# Patient Record
Sex: Female | Born: 1990
Health system: Southern US, Community
[De-identification: ages and names within clinical notes are randomized; demographics above are authoritative.]

## PROBLEM LIST (undated history)

## (undated) ENCOUNTER — Inpatient Hospital Stay (HOSPITAL_COMMUNITY): Payer: Self-pay

## (undated) ENCOUNTER — Emergency Department (HOSPITAL_COMMUNITY): Admission: EM | Payer: Medicaid Other | Source: Home / Self Care

## (undated) DIAGNOSIS — F32A Depression, unspecified: Secondary | ICD-10-CM

## (undated) DIAGNOSIS — Z87898 Personal history of other specified conditions: Secondary | ICD-10-CM

## (undated) DIAGNOSIS — F329 Major depressive disorder, single episode, unspecified: Secondary | ICD-10-CM

## (undated) HISTORY — PX: WISDOM TOOTH EXTRACTION: SHX21

## (undated) HISTORY — PX: OTHER SURGICAL HISTORY: SHX169

## (undated) HISTORY — DX: Depression, unspecified: F32.A

## (undated) HISTORY — PX: NO PAST SURGERIES: SHX2092

## (undated) HISTORY — DX: Major depressive disorder, single episode, unspecified: F32.9

---
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
## 2011-04-29 ENCOUNTER — Inpatient Hospital Stay (HOSPITAL_COMMUNITY)
Admission: RE | Admit: 2011-04-29 | Discharge: 2011-04-29 | Disposition: A | Discharge status: Home / Self Care | Payer: Self-pay | Source: Ambulatory Visit | Attending: Emergency Medicine | Admitting: Emergency Medicine

## 2011-05-03 LAB — URINALYSIS, ROUTINE W REFLEX MICROSCOPIC
Glucose, UA: NEGATIVE
Specific Gravity, Urine: 1.022
pH: 6.5

## 2011-05-03 LAB — URINE MICROSCOPIC-ADD ON

## 2011-05-03 LAB — PREGNANCY, URINE: Preg Test, Ur: NEGATIVE

## 2011-05-14 LAB — RAPID STREP SCREEN (MED CTR MEBANE ONLY): Streptococcus, Group A Screen (Direct): NEGATIVE

## 2011-09-05 ENCOUNTER — Emergency Department (HOSPITAL_COMMUNITY): Payer: Self-pay

## 2011-09-05 ENCOUNTER — Encounter (HOSPITAL_COMMUNITY): Payer: Self-pay | Admitting: Emergency Medicine

## 2011-09-05 ENCOUNTER — Emergency Department (HOSPITAL_COMMUNITY)
Admission: EM | Admit: 2011-09-05 | Discharge: 2011-09-05 | Disposition: A | Discharge status: Home / Self Care | Payer: Self-pay | Attending: Emergency Medicine | Admitting: Emergency Medicine

## 2011-09-05 DIAGNOSIS — R6884 Jaw pain: Secondary | ICD-10-CM | POA: Insufficient documentation
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
## 2011-09-09 NOTE — ED Provider Notes (Signed)
Medical screening examination/treatment/procedure(s) were performed by non-physician practitioner and as supervising physician I was immediately available for consultation/collaboration.   Rolan Bucco, MD 09/09/11 1505

## 2011-10-04 ENCOUNTER — Emergency Department (HOSPITAL_COMMUNITY)
Admission: EM | Admit: 2011-10-04 | Discharge: 2011-10-04 | Disposition: A | Discharge status: Home Health | Payer: Self-pay | Attending: Emergency Medicine | Admitting: Emergency Medicine

## 2011-10-04 ENCOUNTER — Encounter (HOSPITAL_COMMUNITY): Payer: Self-pay

## 2011-10-04 DIAGNOSIS — N9489 Other specified conditions associated with female genital organs and menstrual cycle: Secondary | ICD-10-CM | POA: Insufficient documentation

## 2011-10-04 DIAGNOSIS — N898 Other specified noninflammatory disorders of vagina: Secondary | ICD-10-CM | POA: Insufficient documentation

## 2011-10-04 DIAGNOSIS — N72 Inflammatory disease of cervix uteri: Secondary | ICD-10-CM | POA: Insufficient documentation

## 2011-10-04 LAB — URINALYSIS, ROUTINE W REFLEX MICROSCOPIC
Bilirubin Urine: NEGATIVE
Hgb urine dipstick: NEGATIVE
Ketones, ur: NEGATIVE mg/dL
Nitrite: NEGATIVE
Protein, ur: NEGATIVE mg/dL
Specific Gravity, Urine: 1.016 (ref 1.005–1.030)
Urobilinogen, UA: 0.2 mg/dL (ref 0.0–1.0)

## 2011-10-04 LAB — URINE MICROSCOPIC-ADD ON

## 2011-10-04 LAB — WET PREP, GENITAL: Trich, Wet Prep: NONE SEEN

## 2011-10-04 LAB — POCT PREGNANCY, URINE: Preg Test, Ur: NEGATIVE

## 2011-10-04 MED ORDER — LIDOCAINE HCL (PF) 1 % IJ SOLN
INTRAMUSCULAR | Status: AC
  Administered 2011-10-04: 5 mL via INTRAMUSCULAR
  Filled 2011-10-04: qty 5

## 2011-10-04 MED ORDER — CEFTRIAXONE SODIUM 250 MG IJ SOLR
250.0000 mg | Freq: Once | INTRAMUSCULAR | Status: AC
Start: 2011-10-04 — End: 2011-10-04
  Administered 2011-10-04: 250 mg via INTRAMUSCULAR
  Filled 2011-10-04: qty 250

## 2011-10-04 MED ORDER — AZITHROMYCIN 250 MG PO TABS
1000.0000 mg | ORAL_TABLET | Freq: Once | ORAL | Status: AC
  Administered 2011-10-04: 1000 mg via ORAL
  Filled 2011-10-04: qty 4

## 2011-10-04 NOTE — ED Provider Notes (Signed)
Medical screening examination/treatment/procedure(s) were performed by non-physician practitioner and as supervising physician I was immediately available for consultation/collaboration. Devoria Albe, MD, Armando Gang   Ward Givens, MD 10/04/11 2007

## 2011-10-04 NOTE — Discharge Instructions (Signed)
You were treated today for gonorrhea and chlamydia, the most common sexually transmitted infections that cause vaginal discharge. The test results for these will be available in 2-3 days. Please do not have sex for 1 week after you are treated for these diseases. If your test comes back positive, your sexual partner should also be treated.     Cervicitis Cervicitis is a soreness and swelling (inflammation) of the cervix. Your cervix is located at the bottom of your uterus which opens up to the vagina.  CAUSES   Sexually transmitted infections (STIs).   Allergic reaction.   Medicines or birth control devices that are put in the vagina.   Injury to the cervix.   Bacterial infections.  SYMPTOMS  There may be no symptoms. If symptoms occur, they may include:  Grey, white, yellow, or bad smelling vaginal discharge.   Pain or itching of the area outside the vagina.   Painful sexual intercourse.   Lower abdominal or lower back pain, especially during intercourse.   Frequent urination.   Abnormal vaginal bleeding between periods, after sexual intercourse, or after menopause.   Pressure or a heavy feeling in the pelvis.  DIAGNOSIS  Diagnosis is made after a pelvic exam. Other tests may include:  Examination of any discharge under a microscope (wet prep).   A Pap test.  TREATMENT  Treatment will depend on the cause of cervicitis. If it is caused by an STI, both you and your partner will need to be treated. Antibiotic medicines will be given. HOME CARE INSTRUCTIONS   Do not have sexual intercourse until your caregiver says it is okay.   Do not have sexual intercourse until your partner has been treated if your cervicitis is caused by an STI.   Take your antibiotics as directed. Finish them even if you start to feel better.  SEEK IMMEDIATE MEDICAL CARE IF:   Your symptoms come back.   You have a fever.   You experience any problems that may be related to the medicine you are  taking.  MAKE SURE YOU:   Understand these instructions.   Will watch your condition.   Will get help right away if you are not doing well or get worse.  Document Released: 07/29/2005 Document Revised: 04/10/2011 Document Reviewed: 02/25/2011 [facility] Patient Information 2012 [place], [place].

## 2011-10-04 NOTE — ED Notes (Signed)
Pt. Reports having pelvic pain with vaginal pain and discharge a, also reports having dysuria

## 2011-10-04 NOTE — ED Notes (Signed)
Discharge instructions reviewed with pt; verbalizes understanding.  No questions asked; no further c/o's voiced.  Pt ambulatory to lobby.

## 2011-10-04 NOTE — ED Provider Notes (Signed)
History     CSN: [id number]  Arrival date & time 10/04/11  1644   First MD Initiated Contact with Patient 10/04/11 1751      Chief Complaint  Patient presents with  . Pelvic Pain    (Consider location/radiation/quality/duration/timing/severity/associated sxs/prior treatment) The history is provided by the patient.   the patient is a 21 year old healthy G1 P57 female who presents the emergency department with 2 days of yellow/white vaginal discharge associated with pelvic pressure, dysuria, and urinary frequency. She denies any fever, chills, abdominal pain, nausea, vomiting, change in bowel habits, vaginal pain/itching/odor, vaginal bleeding, hematuria, back pain . She has had recent unprotected intercourse with one partner. She does not know if she could be pregnant. Last menstrual period was sometime in the middle of December. She has an implant for birth control that she reports has been associated with irregular menses in the past. Nothing makes the symptoms better or worse. she has tried nothing to treat the symptoms.             History reviewed. No pertinent past medical history.  History reviewed. No pertinent past surgical history.  No family history on file.  History  Substance Use Topics  . Smoking status: Current Everyday Smoker  . Smokeless tobacco: Not on file  . Alcohol Use: Yes     Review of Systems 10 systems reviewed and are negative for acute change except as noted in the HPI.  Allergies  Review of patient's allergies indicates no known allergies.  Home Medications   Current Outpatient Rx  Name Route Sig Dispense Refill  . IBUPROFEN 200 MG PO TABS Oral Take 400 mg by mouth every 6 (six) hours as needed. For headache pain      BP 94/49  Pulse 91  Temp(Src) 98.9 F (37.2 C) (Oral)  Resp 20  Ht 5\' 2"  (1.575 m)  Wt 93 lb (42.185 kg)  BMI 17.01 kg/m2  LMP 08/01/2011  Physical Exam  Nursing note and vitals reviewed. Constitutional: She is oriented  to person, place, and time. She appears well-developed and well-nourished. No distress.  HENT:  Head: Normocephalic and atraumatic.  Neck: Neck supple.  Cardiovascular: Normal rate, regular rhythm, normal heart sounds and intact distal pulses.   Pulmonary/Chest: Effort normal and breath sounds normal. No respiratory distress. She has no wheezes.  Abdominal: Soft. Bowel sounds are normal. She exhibits no distension. There is no tenderness. There is no rebound and no guarding.  Genitourinary: There is no rash, tenderness, lesion or injury on the right labia. There is no rash, tenderness, lesion or injury on the left labia. Cervix exhibits no motion tenderness. Right adnexum displays no mass and no tenderness. Left adnexum displays no mass and no tenderness. No tenderness or bleeding around the vagina. No foreign body around the vagina. Vaginal discharge found.  Musculoskeletal: She exhibits no edema and no tenderness.  Neurological: She is alert and oriented to person, place, and time.  Skin: Skin is warm and dry.  Psychiatric: She has a normal mood and affect.    ED Course  Procedures (including critical care time)  Labs Reviewed  URINALYSIS, ROUTINE W REFLEX MICROSCOPIC - Abnormal; Notable for the following:    APPearance TURBID (*)    Leukocytes, UA SMALL (*)    All other components within normal limits  WET PREP, GENITAL - Abnormal; Notable for the following:    WBC, Wet Prep HPF POC MODERATE (*)    All other components within normal limits  URINE MICROSCOPIC-ADD ON - Abnormal; Notable for the following:    Squamous Epithelial / LPF FEW (*)    Bacteria, UA FEW (*)    All other components within normal limits  POCT PREGNANCY, URINE  GC/CHLAMYDIA PROBE AMP, GENITAL   No results found.   1. Cervicitis       MDM  5:55 PM Patient has been seen and evaluated. Initial history and physical examination complete. Will perform a pelvic exam. Urinalysis and urine pregnancy test are  pending.  7:35 PM Urinalysis, wet prep reviewed with results consistent with cervicitis. No s/s PID. Will tx for STDs. Will send urine for culture, though I suspect contaminated sample as cause of few bacteria.      Shaaron Adler, [place] 10/04/11 1945

## 2011-10-05 LAB — GC/CHLAMYDIA PROBE AMP, GENITAL
Chlamydia, DNA Probe: POSITIVE — AB
GC Probe Amp, Genital: NEGATIVE

## 2011-10-06 NOTE — ED Notes (Signed)
Attempted to call patient.  No answer.

## 2011-10-06 NOTE — ED Notes (Signed)
+  Chlamydia. Patient treated with Rocephin and Zithromax. 
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
## 2012-01-10 ENCOUNTER — Encounter (HOSPITAL_COMMUNITY): Payer: Self-pay | Admitting: Emergency Medicine

## 2012-01-10 ENCOUNTER — Emergency Department (HOSPITAL_COMMUNITY)
Admission: EM | Admit: 2012-01-10 | Discharge: 2012-01-11 | Disposition: A | Discharge status: Home / Self Care | Payer: Self-pay | Attending: Emergency Medicine | Admitting: Emergency Medicine

## 2012-01-10 DIAGNOSIS — R3989 Other symptoms and signs involving the genitourinary system: Secondary | ICD-10-CM | POA: Insufficient documentation

## 2012-01-10 DIAGNOSIS — F410 Panic disorder [episodic paroxysmal anxiety] without agoraphobia: Secondary | ICD-10-CM | POA: Insufficient documentation

## 2012-01-10 DIAGNOSIS — R55 Syncope and collapse: Secondary | ICD-10-CM | POA: Insufficient documentation

## 2012-01-10 LAB — DIFFERENTIAL
Basophils Absolute: 0 10*3/uL (ref 0.0–0.1)
Basophils Relative: 0 % (ref 0–1)
Eosinophils Absolute: 0.1 10*3/uL (ref 0.0–0.7)
Eosinophils Relative: 1 % (ref 0–5)
Lymphs Abs: 1.4 10*3/uL (ref 0.7–4.0)
Neutrophils Relative %: 72 % (ref 43–77)

## 2012-01-10 LAB — GLUCOSE, CAPILLARY: Glucose-Capillary: 89 mg/dL (ref 70–99)

## 2012-01-10 LAB — BASIC METABOLIC PANEL
Calcium: 9.1 mg/dL (ref 8.4–10.5)
GFR calc Af Amer: >90 mL/min (ref >90)
GFR calc non Af Amer: >90 mL/min (ref >90)
Glucose, Bld: 94 mg/dL (ref 70–99)
Potassium: 3.2 mEq/L — ABNORMAL LOW (ref 3.5–5.1)
Sodium: 139 mEq/L (ref 135–145)

## 2012-01-10 LAB — POCT PREGNANCY, URINE: Preg Test, Ur: NEGATIVE

## 2012-01-10 LAB — CBC
MCH: 29.4 pg (ref 26.0–34.0)
MCHC: 35.1 g/dL (ref 30.0–36.0)
MCV: 83.9 fL (ref 78.0–100.0)
Platelets: 167 10*3/uL (ref 150–400)
RBC: 4.28 MIL/uL (ref 3.87–5.11)
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
## 2012-01-11 ENCOUNTER — Encounter (HOSPITAL_COMMUNITY): Payer: Self-pay

## 2012-01-11 NOTE — Discharge Instructions (Signed)

## 2012-01-11 NOTE — ED Provider Notes (Signed)
History     CSN: [id number]  Arrival date & time 01/10/12  2131   First MD Initiated Contact with Patient 01/10/12 2315      Chief Complaint  Patient presents with  . Near Syncope     The history is provided by the patient (The fianc).   patient reports she's had URI like symptoms over the past 12-24 hours.  She was lying in bed when she acutely felt like she was unable to breathe and felt a tightness across her neck.  She began to have tingling in her fingertips and tingling around her lips.  She felt like she couldn't move.  Family brought her emergently to the hospital at which point they say she was "fainting".  She was seen in the triage area and responded to ammonia packet.  At that time she became alert and conscious.  At time of my valuation the patient reports she feels much better.  She has no chest pain chest tightness or neck pain or tightness.  She denies prior symptoms similar to this.  Denies fevers or chills.  She has no shortness of breath.  Her symptoms are moderate to severe when they occurred.  They're absent at this time.  She reports marijuana abuse.  She smokes cigarettes.  She's not on any medications.  History reviewed. No pertinent past medical history.  History reviewed. No pertinent past surgical history.  No family history on file.  History  Substance Use Topics  . Smoking status: Current Everyday Smoker  . Smokeless tobacco: Not on file  . Alcohol Use: Yes    OB History    Grav Para Term Preterm Abortions TAB SAB Ect Mult Living                  Review of Systems  All other systems reviewed and are negative.    Allergies  Review of patient's allergies indicates no known allergies.  Home Medications   Current Outpatient Rx  Name Route Sig Dispense Refill  . ACETAMINOPHEN 500 MG PO TABS Oral Take 500 mg by mouth every 6 (six) hours as needed. For headaches      BP 112/76  Pulse 78  Temp(Src) 97.6 F (36.4 C) (Axillary)  Resp 22   SpO2 100%  Physical Exam  Nursing note and vitals reviewed. Constitutional: She is oriented to person, place, and time. She appears well-developed and well-nourished. No distress.  HENT:  Head: Normocephalic and atraumatic.  Eyes: EOM are normal.  Neck: Normal range of motion.  Cardiovascular: Normal rate, regular rhythm and normal heart sounds.   Pulmonary/Chest: Effort normal and breath sounds normal.  Abdominal: Soft. She exhibits no distension. There is no tenderness.  Musculoskeletal: Normal range of motion.  Neurological: She is alert and oriented to person, place, and time.  Skin: Skin is warm and dry.  Psychiatric: She has a normal mood and affect. Judgment normal.    ED Course  Procedures (including critical care time)  Labs Reviewed  CBC - Abnormal; Notable for the following:    HCT 35.9 (*)    All other components within normal limits  BASIC METABOLIC PANEL - Abnormal; Notable for the following:    Potassium 3.2 (*)    All other components within normal limits  DIFFERENTIAL  GLUCOSE, CAPILLARY  POCT PREGNANCY, URINE   No results found.   1. Anxiety attack       MDM  Her symptoms sound suggestive of a panic attack.  She feels  much better at this time.  I recommended outpatient followup with a psychologist or psychiatrist if she doesn't feel better or for symptoms are recurrent.        Lyanne Co, MD 01/11/12 [id number]

## 2012-05-03 ENCOUNTER — Encounter (HOSPITAL_COMMUNITY): Payer: Self-pay | Admitting: *Deleted

## 2012-05-03 ENCOUNTER — Emergency Department (HOSPITAL_COMMUNITY)
Admission: EM | Admit: 2012-05-03 | Discharge: 2012-05-03 | Disposition: A | Discharge status: Home / Self Care | Payer: Self-pay | Attending: Emergency Medicine | Admitting: Emergency Medicine

## 2012-05-03 DIAGNOSIS — B9789 Other viral agents as the cause of diseases classified elsewhere: Secondary | ICD-10-CM | POA: Insufficient documentation

## 2012-05-03 DIAGNOSIS — J069 Acute upper respiratory infection, unspecified: Secondary | ICD-10-CM | POA: Insufficient documentation

## 2012-05-03 DIAGNOSIS — B349 Viral infection, unspecified: Secondary | ICD-10-CM

## 2012-05-03 LAB — RAPID STREP SCREEN (MED CTR MEBANE ONLY): Streptococcus, Group A Screen (Direct): NEGATIVE

## 2012-05-03 MED ORDER — SODIUM CHLORIDE 0.9 % IV BOLUS (SEPSIS)
1000.0000 mL | Freq: Once | INTRAVENOUS | Status: AC
  Administered 2012-05-03: 1000 mL via INTRAVENOUS

## 2012-05-03 MED ORDER — KETOROLAC TROMETHAMINE 30 MG/ML IJ SOLN
30.0000 mg | Freq: Once | INTRAMUSCULAR | Status: AC
  Administered 2012-05-03: 30 mg via INTRAVENOUS
  Filled 2012-05-03: qty 1
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
## 2012-05-03 NOTE — ED Notes (Signed)
Patient reports she has had body aches for 1 weeks,  She developed fever, sore throat, and headache last night.  Patient with no s/sx of distress in triage.  She took nyquil last night

## 2012-05-03 NOTE — ED Notes (Signed)
Pt. Stated, I've had a sore throat and headache for the last 4 days.

## 2013-03-19 ENCOUNTER — Encounter (HOSPITAL_COMMUNITY): Payer: Self-pay | Admitting: Emergency Medicine

## 2013-03-19 ENCOUNTER — Emergency Department (HOSPITAL_COMMUNITY)
Admission: EM | Admit: 2013-03-19 | Discharge: 2013-03-19 | Disposition: A | Discharge status: Home / Self Care | Payer: Medicaid Other | Attending: Emergency Medicine | Admitting: Emergency Medicine

## 2013-03-19 DIAGNOSIS — Z87891 Personal history of nicotine dependence: Secondary | ICD-10-CM | POA: Insufficient documentation

## 2013-03-19 DIAGNOSIS — R509 Fever, unspecified: Secondary | ICD-10-CM | POA: Insufficient documentation

## 2013-03-19 DIAGNOSIS — R059 Cough, unspecified: Secondary | ICD-10-CM | POA: Insufficient documentation

## 2013-03-19 DIAGNOSIS — R05 Cough: Secondary | ICD-10-CM | POA: Insufficient documentation

## 2013-03-19 DIAGNOSIS — J020 Streptococcal pharyngitis: Secondary | ICD-10-CM | POA: Insufficient documentation

## 2013-03-19 MED ORDER — LIDOCAINE VISCOUS 2 % MT SOLN: 15.0000 mL | OROMUCOSAL | Status: DC | PRN

## 2013-03-19 MED ORDER — LIDOCAINE VISCOUS 2 % MT SOLN
15.0000 mL | Freq: Once | OROMUCOSAL | Status: AC
  Administered 2013-03-19: 15 mL via OROMUCOSAL
  Filled 2013-03-19: qty 15

## 2013-03-19 MED ORDER — PENICILLIN G BENZATHINE 1200000 UNIT/2ML IM SUSP
1.2000 10*6.[IU] | Freq: Once | INTRAMUSCULAR | Status: AC
  Administered 2013-03-19: 1.2 10*6.[IU] via INTRAMUSCULAR
  Filled 2013-03-19: qty 2

## 2013-03-19 NOTE — ED Provider Notes (Signed)
Medical screening examination/treatment/procedure(s) were performed by non-physician practitioner and as supervising physician I was immediately available for consultation/collaboration.  Geoffery Lyons, MD 03/19/13 1414

## 2013-03-19 NOTE — ED Provider Notes (Signed)
CSN: [id number]     Arrival date & time 03/19/13  1122 History     First MD Initiated Contact with Patient 03/19/13 1128     Chief Complaint  Patient presents with  . Sore Throat   (Consider location/radiation/quality/duration/timing/severity/associated sxs/prior Treatment) Patient is a 22 y.o. female presenting with pharyngitis. The history is provided by the patient.  Sore Throat This is a new problem. Episode onset: 2 days ago. The problem occurs constantly. The problem has been gradually worsening. Associated symptoms include chills, coughing (occcasional cough), a fever (subjective fever), a sore throat and swollen glands. Pertinent negatives include no abdominal pain, nausea, neck pain, rash or vomiting. The symptoms are aggravated by swallowing. She has tried nothing for the symptoms.    History reviewed. No pertinent past medical history. History reviewed. No pertinent past surgical history. History reviewed. No pertinent family history. History  Substance Use Topics  . Smoking status: Former Games developer  . Smokeless tobacco: Not on file  . Alcohol Use: Yes   OB History   Grav Para Term Preterm Abortions TAB SAB Ect Mult Living                 Review of Systems  Constitutional: Positive for fever (subjective fever) and chills.       Subjective fever  HENT: Positive for sore throat. Negative for drooling, trouble swallowing, neck pain, neck stiffness and voice change.   Respiratory: Positive for cough (occcasional cough). Negative for shortness of breath.   Gastrointestinal: Negative for nausea, vomiting and abdominal pain.  Skin: Negative for rash.  All other systems reviewed and are negative.    Allergies  Review of patient's allergies indicates no known allergies.  Home Medications   Current Outpatient Rx  Name  Route  Sig  Dispense  Refill  . etonogestrel (IMPLANON) 68 MG IMPL implant   Subcutaneous   Inject 1 each into the skin once.         Marland Kitchen ibuprofen  (ADVIL,MOTRIN) 200 MG tablet   Oral   Take 200 mg by mouth every 8 (eight) hours as needed for pain.          BP 108/67  Temp(Src) 97.9 F (36.6 C) (Oral)  Resp 18  SpO2 98% Physical Exam  Nursing note and vitals reviewed. Constitutional: She appears well-developed and well-nourished.  HENT:  Head: Normocephalic and atraumatic. No trismus in the jaw.  Right Ear: Tympanic membrane and ear canal normal.  Left Ear: Tympanic membrane and ear canal normal.  Nose: Nose normal.  Mouth/Throat: Uvula is midline. No edematous. Posterior oropharyngeal erythema present. No posterior oropharyngeal edema or tonsillar abscesses.  Normal voice phonation Patient drinking Coke without difficulty No drooling  Cardiovascular: Normal rate, regular rhythm and normal heart sounds.   Pulmonary/Chest: Effort normal and breath sounds normal.  Neurological: She is alert.  Skin: Skin is warm and dry.  Psychiatric: She has a normal mood and affect.    ED Course   Procedures (including critical care time)  Labs Reviewed  RAPID STREP SCREEN - Abnormal; Notable for the following:    Streptococcus, Group A Screen (Direct) POSITIVE (*)    All other components within normal limits   No results found. No diagnosis found.  MDM  Patient presenting with sore throat x 2 days.  Rapid strep positive.  No signs of peritonsillar abscess.  Patient given Bicillin IM and viscous lidocaine in the ED.  Patient stable for discharge.  Return precautions given.  Pascal Lux  Maralyn Sago, PA-C 03/19/13 1335

## 2013-03-19 NOTE — ED Notes (Signed)
Pt c/o sore throat x 2 days with aches x 2 days; pt sts some fatigue

## 2013-03-19 NOTE — ED Notes (Signed)
Sorethroat, onset 2 days ago. White/yellow exudate noted in throat.

## 2013-04-27 ENCOUNTER — Emergency Department (HOSPITAL_COMMUNITY)
Admission: EM | Admit: 2013-04-27 | Discharge: 2013-04-27 | Disposition: A | Discharge status: Home / Self Care | Payer: Medicaid Other | Attending: Emergency Medicine | Admitting: Emergency Medicine

## 2013-04-27 ENCOUNTER — Encounter (HOSPITAL_COMMUNITY): Payer: Self-pay | Admitting: *Deleted

## 2013-04-27 DIAGNOSIS — Z79899 Other long term (current) drug therapy: Secondary | ICD-10-CM | POA: Insufficient documentation

## 2013-04-27 DIAGNOSIS — Z202 Contact with and (suspected) exposure to infections with a predominantly sexual mode of transmission: Secondary | ICD-10-CM

## 2013-04-27 DIAGNOSIS — R059 Cough, unspecified: Secondary | ICD-10-CM | POA: Insufficient documentation

## 2013-04-27 DIAGNOSIS — Z3202 Encounter for pregnancy test, result negative: Secondary | ICD-10-CM

## 2013-04-27 DIAGNOSIS — Z113 Encounter for screening for infections with a predominantly sexual mode of transmission: Secondary | ICD-10-CM | POA: Insufficient documentation

## 2013-04-27 DIAGNOSIS — F17200 Nicotine dependence, unspecified, uncomplicated: Secondary | ICD-10-CM | POA: Insufficient documentation

## 2013-04-27 DIAGNOSIS — R05 Cough: Secondary | ICD-10-CM | POA: Insufficient documentation

## 2013-04-27 DIAGNOSIS — N898 Other specified noninflammatory disorders of vagina: Secondary | ICD-10-CM | POA: Insufficient documentation

## 2013-04-27 LAB — URINALYSIS, ROUTINE W REFLEX MICROSCOPIC
Glucose, UA: NEGATIVE mg/dL
Hgb urine dipstick: NEGATIVE
Ketones, ur: NEGATIVE mg/dL
Protein, ur: NEGATIVE mg/dL
Urobilinogen, UA: 1 mg/dL (ref 0.0–1.0)

## 2013-04-27 LAB — WET PREP, GENITAL

## 2013-04-27 LAB — URINE MICROSCOPIC-ADD ON

## 2013-04-27 MED ORDER — CEFTRIAXONE SODIUM 1 G IJ SOLR
1.0000 g | Freq: Once | INTRAMUSCULAR | Status: AC
  Administered 2013-04-27: 1 g via INTRAMUSCULAR
  Filled 2013-04-27: qty 10

## 2013-04-27 MED ORDER — METRONIDAZOLE 500 MG PO TABS: 500.0000 mg | ORAL_TABLET | Freq: Two times a day (BID) | ORAL | Status: DC

## 2013-04-27 MED ORDER — AZITHROMYCIN 1 G PO PACK
1.0000 g | PACK | Freq: Once | ORAL | Status: AC
  Administered 2013-04-27: 1 g via ORAL
  Filled 2013-04-27: qty 1

## 2013-04-27 MED ORDER — LIDOCAINE HCL (PF) 1 % IJ SOLN
INTRAMUSCULAR | Status: AC
  Administered 2013-04-27: 2 mL
  Filled 2013-04-27: qty 5

## 2013-04-27 NOTE — ED Notes (Signed)
Pt discharged.Vital signs stable and GCS 15 
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
## 2013-04-27 NOTE — ED Notes (Signed)
States here to be checked for pregnancy & STD's

## 2013-04-28 LAB — URINE CULTURE: Culture: NO GROWTH

## 2013-04-28 NOTE — ED Provider Notes (Signed)
Medical screening examination/treatment/procedure(s) were performed by non-physician practitioner and as supervising physician I was immediately available for consultation/collaboration.   Christina Waldrop B. Kylene Zamarron, MD 04/28/13 1842 

## 2013-06-28 ENCOUNTER — Encounter (HOSPITAL_COMMUNITY): Payer: Self-pay | Admitting: Emergency Medicine

## 2013-06-28 ENCOUNTER — Emergency Department (HOSPITAL_COMMUNITY)
Admission: EM | Admit: 2013-06-28 | Discharge: 2013-06-28 | Disposition: A | Discharge status: Home / Self Care | Payer: Medicaid Other | Attending: Emergency Medicine | Admitting: Emergency Medicine

## 2013-06-28 DIAGNOSIS — F17200 Nicotine dependence, unspecified, uncomplicated: Secondary | ICD-10-CM | POA: Insufficient documentation

## 2013-06-28 DIAGNOSIS — Z711 Person with feared health complaint in whom no diagnosis is made: Secondary | ICD-10-CM | POA: Insufficient documentation

## 2013-06-28 DIAGNOSIS — Z3202 Encounter for pregnancy test, result negative: Secondary | ICD-10-CM | POA: Insufficient documentation

## 2013-06-28 LAB — POCT PREGNANCY, URINE: Preg Test, Ur: NEGATIVE

## 2013-06-28 NOTE — ED Provider Notes (Signed)
Medical screening examination/treatment/procedure(s) were performed by non-physician practitioner and as supervising physician I was immediately available for consultation/collaboration.  EKG Interpretation   None         Ronne Savoia M Amaurie Wandel, DO 06/28/13 1714 

## 2013-06-28 NOTE — ED Notes (Signed)
Pt alert and mentating appropriately upon d/c. Pt given d/c teaching and follow up care instructions. Pt verbalizes understanding of d/c teaching and has no further questions upon d/c. NAD noted upon d/c.

## 2013-06-28 NOTE — ED Notes (Signed)
Pt states she just wants to find out if she is pregnant. Pt states on and off pain with nausea x 1 week. Pt states last period was "a couple weeks ago." Pt denies vomiting and denies blood in urine and stool. Pt states pain occasionally radiates to back. Pt states just not feeling her normal. Skin warm and dry. NAD noted at this time.

## 2013-06-28 NOTE — ED Provider Notes (Signed)
CSN: [id number]     Arrival date & time 06/28/13  0756 History   First MD Initiated Contact with Patient 06/28/13 [phone]     Chief Complaint  Patient presents with  . Possible Pregnancy   (Consider location/radiation/quality/duration/timing/severity/associated sxs/prior Treatment) HPI  22 year old G1 P74 female presents to ED requesting for a pregnancy test due to "I think a pregnant". Patient states she is having some of the similar symptoms to the previous pregnancy include occasional abdominal discomfort, mild nausea, and "not feeling normal". States she has an implant non-but does not think that is working appropriately and is scheduled to have it removed in the next 2 weeks. Her last menstrual period is 2 weeks ago. Her last pregnancy check with a home pregnancy kit was also 2 weeks ago. Otherwise patient has no other complaint. Has been treating as usual. Describe abdominal discomfort as a crampy sensation lasting for a few minutes and resolved without any specific treatment. No dysuria, hematuria, hematochezia or melena. No vaginal bleeding, vaginal discharge, or rash.  History reviewed. No pertinent past medical history. History reviewed. No pertinent past surgical history. History reviewed. No pertinent family history. History  Substance Use Topics  . Smoking status: Current Some Day Smoker  . Smokeless tobacco: Not on file  . Alcohol Use: Yes   OB History   Grav Para Term Preterm Abortions TAB SAB Ect Mult Living                 Review of Systems  All other systems reviewed and are negative.    Allergies  Review of patient's allergies indicates no known allergies.  Home Medications   Current Outpatient Rx  Name  Route  Sig  Dispense  Refill  . etonogestrel (IMPLANON) 68 MG IMPL implant   Subcutaneous   Inject 1 each into the skin once.         Marland Kitchen ibuprofen (ADVIL,MOTRIN) 200 MG tablet   Oral   Take 400 mg by mouth every 6 (six) hours as needed for mild pain.          BP 110/66  Pulse 84  Temp(Src) 98 F (36.7 C) (Oral)  Wt 96 lb (43.545 kg)  SpO2 100% Physical Exam  Nursing note and vitals reviewed. Constitutional: She appears well-developed and well-nourished. No distress.  Awake, alert, nontoxic appearance  HENT:  Head: Atraumatic.  Eyes: Conjunctivae are normal. Right eye exhibits no discharge. Left eye exhibits no discharge.  Neck: Neck supple.  Cardiovascular: Normal rate and regular rhythm.   Pulmonary/Chest: Effort normal. No respiratory distress. She exhibits no tenderness.  Abdominal: Soft. There is no tenderness. There is no rebound.  Musculoskeletal: She exhibits no tenderness.  ROM appears intact, no obvious focal weakness  Neurological:  Mental status and motor strength appears intact  Skin: No rash noted.  Psychiatric: She has a normal mood and affect.    ED Course  Procedures (including critical care time)  8:29 AM Patient presents with request for pregnancy test. Her pregnancy test today is negative. Result discussed with patient. Recommend follow up with PCP as needed.  Labs Review Labs Reviewed  POCT PREGNANCY, URINE   Imaging Review No results found.  EKG Interpretation   None       MDM   1. Worried well    BP 94/62  Pulse 69  Temp(Src) 98.1 F (36.7 C) (Oral)  Resp 14  Wt 96 lb (43.545 kg)  SpO2 99%     Fayrene Helper, PA-C 06/28/13  1536 

## 2013-08-18 ENCOUNTER — Emergency Department (HOSPITAL_COMMUNITY)
Admission: EM | Admit: 2013-08-18 | Discharge: 2013-08-18 | Disposition: A | Discharge status: Home / Self Care | Payer: Medicaid Other | Attending: Emergency Medicine | Admitting: Emergency Medicine

## 2013-08-18 ENCOUNTER — Encounter (HOSPITAL_COMMUNITY): Payer: Self-pay | Admitting: Emergency Medicine

## 2013-08-18 DIAGNOSIS — F17200 Nicotine dependence, unspecified, uncomplicated: Secondary | ICD-10-CM | POA: Insufficient documentation

## 2013-08-18 DIAGNOSIS — B349 Viral infection, unspecified: Secondary | ICD-10-CM

## 2013-08-18 DIAGNOSIS — R1013 Epigastric pain: Secondary | ICD-10-CM | POA: Insufficient documentation

## 2013-08-18 DIAGNOSIS — Z3202 Encounter for pregnancy test, result negative: Secondary | ICD-10-CM | POA: Insufficient documentation

## 2013-08-18 DIAGNOSIS — B9789 Other viral agents as the cause of diseases classified elsewhere: Secondary | ICD-10-CM | POA: Insufficient documentation

## 2013-08-18 LAB — CBC WITH DIFFERENTIAL/PLATELET
Basophils Absolute: 0 10*3/uL (ref 0.0–0.1)
Basophils Relative: 1 % (ref 0–1)
EOS PCT: 3 % (ref 0–5)
Eosinophils Absolute: 0.2 10*3/uL (ref 0.0–0.7)
HCT: 38.7 % (ref 36.0–46.0)
Hemoglobin: 13.4 g/dL (ref 12.0–15.0)
LYMPHS ABS: 2.1 10*3/uL (ref 0.7–4.0)
LYMPHS PCT: 36 % (ref 12–46)
MCH: 29.6 pg (ref 26.0–34.0)
MCHC: 34.6 g/dL (ref 30.0–36.0)
MCV: 85.4 fL (ref 78.0–100.0)
Monocytes Absolute: 0.5 10*3/uL (ref 0.1–1.0)
Monocytes Relative: 8 % (ref 3–12)
NEUTROS ABS: 3.1 10*3/uL (ref 1.7–7.7)
NEUTROS PCT: 53 % (ref 43–77)
PLATELETS: 169 10*3/uL (ref 150–400)
RBC: 4.53 MIL/uL (ref 3.87–5.11)
RDW: 12.3 % (ref 11.5–15.5)
WBC: 5.9 10*3/uL (ref 4.0–10.5)

## 2013-08-18 LAB — COMPREHENSIVE METABOLIC PANEL
ALBUMIN: 3.7 g/dL (ref 3.5–5.2)
ALT: 12 U/L (ref 0–35)
AST: 17 U/L (ref 0–37)
Alkaline Phosphatase: 85 U/L (ref 39–117)
BUN: 9 mg/dL (ref 6–23)
CALCIUM: 8.9 mg/dL (ref 8.4–10.5)
CO2: 26 mEq/L (ref 19–32)
Chloride: 107 mEq/L (ref 96–112)
Creatinine, Ser: 0.54 mg/dL (ref 0.50–1.10)
GFR calc Af Amer: >90 mL/min (ref >90)
GFR calc non Af Amer: >90 mL/min (ref >90)
Glucose, Bld: 94 mg/dL (ref 70–99)
Potassium: 4.2 mEq/L (ref 3.7–5.3)
SODIUM: 144 meq/L (ref 137–147)
Total Bilirubin: 0.5 mg/dL (ref 0.3–1.2)
Total Protein: 6.8 g/dL (ref 6.0–8.3)

## 2013-08-18 LAB — URINALYSIS, ROUTINE W REFLEX MICROSCOPIC
Bilirubin Urine: NEGATIVE
Glucose, UA: NEGATIVE mg/dL
Ketones, ur: NEGATIVE mg/dL
Nitrite: NEGATIVE
Protein, ur: NEGATIVE mg/dL
SPECIFIC GRAVITY, URINE: 1.012 (ref 1.005–1.030)
UROBILINOGEN UA: 0.2 mg/dL (ref 0.0–1.0)
pH: 6 (ref 5.0–8.0)

## 2013-08-18 LAB — URINE MICROSCOPIC-ADD ON

## 2013-08-18 LAB — POCT PREGNANCY, URINE: PREG TEST UR: NEGATIVE

## 2013-08-18 LAB — LIPASE, BLOOD: Lipase: 32 U/L (ref 11–59)

## 2013-08-18 MED ORDER — GUAIFENESIN 100 MG/5ML PO LIQD: 100.0000 mg | ORAL | Status: DC | PRN

## 2013-08-18 MED ORDER — SODIUM CHLORIDE 0.9 % IV BOLUS (SEPSIS): 1000.0000 mL | Freq: Once | INTRAVENOUS | Status: DC

## 2013-08-18 NOTE — ED Notes (Signed)
Pt. Refused IV.

## 2013-08-18 NOTE — Discharge Instructions (Signed)

## 2013-08-18 NOTE — ED Notes (Signed)
Pt. Reports having, body aches, chills, cough and sore throat starting 2 days ago. Does not feel well

## 2013-08-18 NOTE — ED Provider Notes (Signed)
CSN: [id number]     Arrival date & time 08/18/13  0737 History   First MD Initiated Contact with Patient 08/18/13 [phone]     Chief Complaint  Patient presents with  . Influenza   (Consider location/radiation/quality/duration/timing/severity/associated sxs/prior Treatment) HPI Comments: Patient presents to the emergency department with chief complaints of generalized body aches, cough, sore throat, and epigastric abdominal tenderness. She states that symptoms began 2 days ago. She denies nausea, vomiting, diarrhea, or constipation. Denies any vaginal discharge, or dysuria. She states that her LMP was in November. She uses an implant for birth control, but thinks it is not working. She has not tried taking anything to alleviate her symptoms. She states that she has been around sick contacts. Nothing makes her symptoms better or worse.  The history is provided by the patient. No language interpreter was used.    History reviewed. No pertinent past medical history. History reviewed. No pertinent past surgical history. No family history on file. History  Substance Use Topics  . Smoking status: Current Some Day Smoker  . Smokeless tobacco: Not on file  . Alcohol Use: Yes   OB History   Grav Para Term Preterm Abortions TAB SAB Ect Mult Living                 Review of Systems  All other systems reviewed and are negative.    Allergies  Review of patient's allergies indicates no known allergies.  Home Medications   Current Outpatient Rx  Name  Route  Sig  Dispense  Refill  . etonogestrel (IMPLANON) 68 MG IMPL implant   Subcutaneous   Inject 1 each into the skin once.         Marland Kitchen. ibuprofen (ADVIL,MOTRIN) 200 MG tablet   Oral   Take 400 mg by mouth every 6 (six) hours as needed for mild pain.          BP 90/51  Pulse 66  Temp(Src) 98 F (36.7 C) (Oral)  Resp 18  Ht 5\' 3"  (1.6 m)  Wt 96 lb (43.545 kg)  BMI 17.01 kg/m2  SpO2 100%  LMP 07/12/2013 Physical Exam  Nursing  note and vitals reviewed. Constitutional: She is oriented to person, place, and time. She appears well-developed and well-nourished.  HENT:  Head: Normocephalic and atraumatic.  Eyes: Conjunctivae and EOM are normal. Pupils are equal, round, and reactive to light.  Neck: Normal range of motion. Neck supple.  Cardiovascular: Normal rate and regular rhythm.  Exam reveals no gallop and no friction rub.   No murmur heard. Pulmonary/Chest: Effort normal and breath sounds normal. No respiratory distress. She has no wheezes. She has no rales. She exhibits no tenderness.  Abdominal: Soft. Bowel sounds are normal. She exhibits no distension and no mass. There is no tenderness. There is no rebound and no guarding.  Mild epigastric tenderness, no other focal abdominal tenderness, no signs of acute or surgical abdomen  Musculoskeletal: Normal range of motion. She exhibits no edema and no tenderness.  Neurological: She is alert and oriented to person, place, and time.  Skin: Skin is warm and dry.  Psychiatric: She has a normal mood and affect. Her behavior is normal. Judgment and thought content normal.    ED Course  Procedures (including critical care time) Results for orders placed during the hospital encounter of 08/18/13  URINALYSIS, ROUTINE W REFLEX MICROSCOPIC      Result Value Range   Color, Urine YELLOW  YELLOW   APPearance CLEAR  CLEAR   Specific Gravity, Urine 1.012  1.005 - 1.030   pH 6.0  5.0 - 8.0   Glucose, UA NEGATIVE  NEGATIVE mg/dL   Hgb urine dipstick TRACE (*) NEGATIVE   Bilirubin Urine NEGATIVE  NEGATIVE   Ketones, ur NEGATIVE  NEGATIVE mg/dL   Protein, ur NEGATIVE  NEGATIVE mg/dL   Urobilinogen, UA 0.2  0.0 - 1.0 mg/dL   Nitrite NEGATIVE  NEGATIVE   Leukocytes, UA TRACE (*) NEGATIVE  CBC WITH DIFFERENTIAL      Result Value Range   WBC 5.9  4.0 - 10.5 K/uL   RBC 4.53  3.87 - 5.11 MIL/uL   Hemoglobin 13.4  12.0 - 15.0 g/dL   HCT 09.8  11.9 - 14.7 %   MCV 85.4  78.0 -  100.0 fL   MCH 29.6  26.0 - 34.0 pg   MCHC 34.6  30.0 - 36.0 g/dL   RDW 82.9  56.2 - 13.0 %   Platelets 169  150 - 400 K/uL   Neutrophils Relative % 53  43 - 77 %   Neutro Abs 3.1  1.7 - 7.7 K/uL   Lymphocytes Relative 36  12 - 46 %   Lymphs Abs 2.1  0.7 - 4.0 K/uL   Monocytes Relative 8  3 - 12 %   Monocytes Absolute 0.5  0.1 - 1.0 K/uL   Eosinophils Relative 3  0 - 5 %   Eosinophils Absolute 0.2  0.0 - 0.7 K/uL   Basophils Relative 1  0 - 1 %   Basophils Absolute 0.0  0.0 - 0.1 K/uL  COMPREHENSIVE METABOLIC PANEL      Result Value Range   Sodium 144  137 - 147 mEq/L   Potassium 4.2  3.7 - 5.3 mEq/L   Chloride 107  96 - 112 mEq/L   CO2 26  19 - 32 mEq/L   Glucose, Bld 94  70 - 99 mg/dL   BUN 9  6 - 23 mg/dL   Creatinine, Ser 8.65  0.50 - 1.10 mg/dL   Calcium 8.9  8.4 - 78.4 mg/dL   Total Protein 6.8  6.0 - 8.3 g/dL   Albumin 3.7  3.5 - 5.2 g/dL   AST 17  0 - 37 U/L   ALT 12  0 - 35 U/L   Alkaline Phosphatase 85  39 - 117 U/L   Total Bilirubin 0.5  0.3 - 1.2 mg/dL   GFR calc non Af Amer >90  >90 mL/min   GFR calc Af Amer >90  >90 mL/min  LIPASE, BLOOD      Result Value Range   Lipase 32  11 - 59 U/L  URINE MICROSCOPIC-ADD ON      Result Value Range   Squamous Epithelial / LPF FEW (*) RARE   WBC, UA 3-6  <3 WBC/hpf   RBC / HPF 0-2  <3 RBC/hpf   Bacteria, UA RARE  RARE  POCT PREGNANCY, URINE      Result Value Range   Preg Test, Ur NEGATIVE  NEGATIVE   No results found.    EKG Interpretation   None       MDM   1. Viral syndrome    Patient with symptoms consistent with influenza. Abdomen is benign.  Vitals are stable, low-grade fever.  No signs of dehydration, tolerating PO's.  Lungs are clear. Due to patient's presentation and physical exam a chest x-ray was not ordered bc likely diagnosis of flu.  Discussed the  cost versus benefit of Tamiflu treatment with the patient.  Patient will be discharged with instructions to orally hydrate, rest, and use  over-the-counter medications such as anti-inflammatories ibuprofen and Aleve for muscle aches and Tylenol for fever.  Patient will also be given a cough suppressant.  On recheck, abdomen remains benign.      Roxy Horseman, PA-C 08/18/13 1010

## 2013-08-19 NOTE — ED Provider Notes (Signed)
Medical screening examination/treatment/procedure(s) were performed by non-physician practitioner and as supervising physician I was immediately available for consultation/collaboration.  EKG Interpretation   None         Candyce ChurnJohn David Milena Liggett, MD 08/19/13 1451

## 2013-09-10 ENCOUNTER — Encounter (HOSPITAL_COMMUNITY): Payer: Self-pay | Admitting: Emergency Medicine

## 2013-09-10 ENCOUNTER — Emergency Department (HOSPITAL_COMMUNITY)
Admission: EM | Admit: 2013-09-10 | Discharge: 2013-09-10 | Disposition: A | Discharge status: Home / Self Care | Payer: Medicaid Other | Attending: Emergency Medicine | Admitting: Emergency Medicine

## 2013-09-10 ENCOUNTER — Emergency Department (HOSPITAL_COMMUNITY): Payer: Medicaid Other

## 2013-09-10 DIAGNOSIS — IMO0001 Reserved for inherently not codable concepts without codable children: Secondary | ICD-10-CM | POA: Insufficient documentation

## 2013-09-10 DIAGNOSIS — F17200 Nicotine dependence, unspecified, uncomplicated: Secondary | ICD-10-CM | POA: Insufficient documentation

## 2013-09-10 DIAGNOSIS — M25569 Pain in unspecified knee: Secondary | ICD-10-CM

## 2013-09-10 NOTE — ED Notes (Signed)
Pt reports left leg pain above her knee since yesterday. Denies any injury. Can bear weight, ambulatory.

## 2013-09-10 NOTE — ED Provider Notes (Signed)
CSN: [id number]     Arrival date & time 09/10/13  1149 History  This chart was scribed for non-physician practitioner, Roxy Horsemanobert Danetra Glock, PA-C working with Derwood KaplanAnkit Nanavati, MD by Greggory StallionKayla Andersen, ED scribe. This patient was seen in room TR09C/TR09C and the patient's care was started at 12:07 PM.   Chief Complaint  Patient presents with  . Leg Pain   The history is provided by the patient. No language interpreter was used.   HPI Comments: Marcia LanJessica L Jackson is a 23 y.o. female who presents to the Emergency Department complaining of gradual onset, constant left leg pain that started yesterday. Denies injury or fall. She states the pain is worse in her thigh and knee. Bearing weight worsens the pain and makes it sharp. 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   No past medical history on file. No past surgical history on file. No family history on file. History  Substance Use Topics  . Smoking status: Current Some Day Smoker  . Smokeless tobacco: Not on file  . Alcohol Use: Yes   OB History   Grav Para Term Preterm Abortions TAB SAB Ect Mult Living                 Review of Systems  Constitutional: Negative for fever.  Musculoskeletal: Positive for arthralgias and myalgias.  Neurological: Negative for speech difficulty.  Psychiatric/Behavioral: Negative for confusion.   Allergies  Review of patient's allergies indicates no known allergies.  Home Medications   Current Outpatient Rx  Name  Route  Sig  Dispense  Refill  . etonogestrel (IMPLANON) 68 MG IMPL implant   Subcutaneous   Inject 1 each into the skin once.         Marland Kitchen. guaiFENesin (ROBITUSSIN) 100 MG/5ML liquid   Oral   Take 5-10 mLs (100-200 mg total) by mouth every 4 (four) hours as needed for cough.   60 mL   0   . ibuprofen (ADVIL,MOTRIN) 200 MG tablet   Oral   Take 400 mg by mouth every 6 (six) hours as needed for mild pain.          BP 112/69  Pulse 72  Temp(Src) 97.5 F (36.4 C) (Oral)   SpO2 100%  LMP 09/03/2013  Physical Exam  Nursing note and vitals reviewed. Constitutional: She is oriented to person, place, and time. She appears well-developed and well-nourished. No distress.  HENT:  Head: Normocephalic and atraumatic.  Eyes: Conjunctivae and EOM are normal.  Cardiovascular: Normal rate and regular rhythm.   Pulmonary/Chest: Effort normal and breath sounds normal. No stridor. No respiratory distress.  Abdominal: She exhibits no distension.  Musculoskeletal: She exhibits no edema.  Left knee moderately tender to palpation over medial joint line. No obvious swelling or joint effusion. Lachman is negative, varus and valgus stress test negative, posterior drawer test negative, no patella apprehension. ROM and strength 5/5.  Neurological: She is alert and oriented to person, place, and time. No cranial nerve deficit.  Skin: Skin is warm and dry.  Psychiatric: She has a normal mood and affect.    ED Course  Procedures (including critical care time)  DIAGNOSTIC STUDIES: Oxygen Saturation is 100% on RA, normal by my interpretation.    COORDINATION OF CARE: 12:08 PM-Discussed treatment plan which includes xray with pt at bedside and pt agreed to plan.   Labs Review Labs Reviewed - No data to display Imaging Review Dg Knee Complete 4 Views Left  09/10/2013  CLINICAL DATA:  Medial left knee pain.  EXAM: LEFT KNEE - COMPLETE 4+ VIEW  COMPARISON:  None.  FINDINGS: There is no evidence of fracture, dislocation, or joint effusion. There is no evidence of arthropathy or other focal bone abnormality. Soft tissues are unremarkable.  IMPRESSION: Negative.   Electronically Signed   By: Elige Ko   On: 09/10/2013 13:28    EKG Interpretation   None       MDM   1. Knee pain    Patient with left knee pain, possibly a meniscal injury. She will ambulate appropriately. Plain films negative. Will give a knee sleeve, rice therapy, and recommend orthopedic followup. Patient  understands and agrees to plan. She is stable and ready for discharge.  I personally performed the services described in this documentation, which was scribed in my presence. The recorded information has been reviewed and is accurate.    Roxy Horseman, PA-C 09/10/13 1334

## 2013-09-10 NOTE — ED Notes (Signed)
C/o left knee pain. No injury. No deformity.

## 2013-09-10 NOTE — Discharge Instructions (Signed)
Arthralgia °Your caregiver has diagnosed you as suffering from an arthralgia. Arthralgia means there is pain in a joint. This can come from many reasons including: °· Bruising the joint which causes soreness (inflammation) in the joint. °· Wear and tear on the joints which occur as we grow older (osteoarthritis). °· Overusing the joint. °· Various forms of arthritis. °· Infections of the joint. °Regardless of the cause of pain in your joint, most of these different pains respond to anti-inflammatory drugs and rest. The exception to this is when a joint is infected, and these cases are treated with antibiotics, if it is a bacterial infection. °HOME CARE INSTRUCTIONS  °· Rest the injured area for as long as directed by your caregiver. Then slowly start using the joint as directed by your caregiver and as the pain allows. Crutches as directed may be useful if the ankles, knees or hips are involved. If the knee was splinted or casted, continue use and care as directed. If an stretchy or elastic wrapping bandage has been applied today, it should be removed and re-applied every 3 to 4 hours. It should not be applied tightly, but firmly enough to keep swelling down. Watch toes and feet for swelling, bluish discoloration, coldness, numbness or excessive pain. If any of these problems (symptoms) occur, remove the ace bandage and re-apply more loosely. If these symptoms persist, contact your caregiver or return to this location. °· For the first 24 hours, keep the injured extremity elevated on pillows while lying down. °· Apply ice for 15-20 minutes to the sore joint every couple hours while awake for the first half day. Then 03-04 times per day for the first 48 hours. Put the ice in a plastic bag and place a towel between the bag of ice and your skin. °· Wear any splinting, casting, elastic bandage applications, or slings as instructed. °· Only take over-the-counter or prescription medicines for pain, discomfort, or fever as  directed by your caregiver. Do not use aspirin immediately after the injury unless instructed by your physician. Aspirin can cause increased bleeding and bruising of the tissues. °· If you were given crutches, continue to use them as instructed and do not resume weight bearing on the sore joint until instructed. °Persistent pain and inability to use the sore joint as directed for more than 2 to 3 days are warning signs indicating that you should see a caregiver for a follow-up visit as soon as possible. Initially, a hairline fracture (break in bone) may not be evident on X-rays. Persistent pain and swelling indicate that further evaluation, non-weight bearing or use of the joint (use of crutches or slings as instructed), or further X-rays are indicated. X-rays may sometimes not show a small fracture until a week or 10 days later. Make a follow-up appointment with your own caregiver or one to whom we have referred you. A radiologist (specialist in reading X-rays) may read your X-rays. Make sure you know how you are to obtain your X-ray results. Do not assume everything is normal if you do not hear from us. °SEEK MEDICAL CARE IF: °Bruising, swelling, or pain increases. °SEEK IMMEDIATE MEDICAL CARE IF:  °· Your fingers or toes are numb or blue. °· The pain is not responding to medications and continues to stay the same or get worse. °· The pain in your joint becomes severe. °· You develop a fever over 102° F (38.9° C). °· It becomes impossible to move or use the joint. °MAKE SURE YOU:  °·   Understand these instructions. °· Will watch your condition. °· Will get help right away if you are not doing well or get worse. °Document Released: 07/29/2005 Document Revised: 10/21/2011 Document Reviewed: 03/16/2008 °[employer]® Patient Information ©2014 [employer]. ° °Cryotherapy °Cryotherapy means treatment with cold. Ice or gel packs can be used to reduce both pain and swelling. Ice is the most helpful within the first 24 to 48  hours after an injury or flareup from overusing a muscle or joint. Sprains, strains, spasms, burning pain, shooting pain, and aches can all be eased with ice. Ice can also be used when recovering from surgery. Ice is effective, has very few side effects, and is safe for most people to use. °PRECAUTIONS  °Ice is not a safe treatment option for people with: °· Raynaud's phenomenon. This is a condition affecting small blood vessels in the extremities. Exposure to cold may cause your problems to return. °· Cold hypersensitivity. There are many forms of cold hypersensitivity, including: °· Cold urticaria. Red, itchy hives appear on the skin when the tissues begin to warm after being iced. °· Cold erythema. This is a red, itchy rash caused by exposure to cold. °· Cold hemoglobinuria. Red blood cells break down when the tissues begin to warm after being iced. The hemoglobin that carry oxygen are passed into the urine because they cannot combine with blood proteins fast enough. °· Numbness or altered sensitivity in the area being iced. °If you have any of the following conditions, do not use ice until you have discussed cryotherapy with your caregiver: °· Heart conditions, such as arrhythmia, angina, or chronic heart disease. °· High blood pressure. °· Healing wounds or open skin in the area being iced. °· Current infections. °· Rheumatoid arthritis. °· Poor circulation. °· Diabetes. °Ice slows the blood flow in the region it is applied. This is beneficial when trying to stop inflamed tissues from spreading irritating chemicals to surrounding tissues. However, if you expose your skin to cold temperatures for too long or without the proper protection, you can damage your skin or nerves. Watch for signs of skin damage due to cold. °HOME CARE INSTRUCTIONS °Follow these tips to use ice and cold packs safely. °· Place a dry or damp towel between the ice and skin. A damp towel will cool the skin more quickly, so you may need to  shorten the time that the ice is used. °· For a more rapid response, add gentle compression to the ice. °· Ice for no more than 10 to 20 minutes at a time. The bonier the area you are icing, the less time it will take to get the benefits of ice. °· Check your skin after 5 minutes to make sure there are no signs of a poor response to cold or skin damage. °· Rest 20 minutes or more in between uses. °· Once your skin is numb, you can end your treatment. You can test numbness by very lightly touching your skin. The touch should be so light that you do not see the skin dimple from the pressure of your fingertip. When using ice, most people will feel these normal sensations in this order: cold, burning, aching, and numbness. °· Do not use ice on someone who cannot communicate their responses to pain, such as small children or people with dementia. °HOW TO MAKE AN ICE PACK °Ice packs are the most common way to use ice therapy. Other methods include ice massage, ice baths, and cryo-sprays. Muscle creams that cause a   cold, tingly feeling do not offer the same benefits that ice offers and should not be used as a substitute unless recommended by your caregiver. °To make an ice pack, do one of the following: °· Place crushed ice or a bag of frozen vegetables in a sealable plastic bag. Squeeze out the excess air. Place this bag inside another plastic bag. Slide the bag into a pillowcase or place a damp towel between your skin and the bag. °· Mix 3 parts water with 1 part rubbing alcohol. Freeze the mixture in a sealable plastic bag. When you remove the mixture from the freezer, it will be slushy. Squeeze out the excess air. Place this bag inside another plastic bag. Slide the bag into a pillowcase or place a damp towel between your skin and the bag. °SEEK MEDICAL CARE IF: °· You develop white spots on your skin. This may give the skin a blotchy (mottled) appearance. °· Your skin turns blue or pale. °· Your skin becomes waxy or  hard. °· Your swelling gets worse. °MAKE SURE YOU:  °· Understand these instructions. °· Will watch your condition. °· Will get help right away if you are not doing well or get worse. °Document Released: 03/25/2011 Document Revised: 10/21/2011 Document Reviewed: 03/25/2011 °[employer]® Patient Information ©2014 [employer]. ° °

## 2013-09-11 NOTE — ED Provider Notes (Signed)
Medical screening examination/treatment/procedure(s) were performed by non-physician practitioner and as supervising physician I was immediately available for consultation/collaboration.  EKG Interpretation   None        Derwood KaplanAnkit Kiasia Chou, MD 09/11/13 [phone]

## 2013-10-14 ENCOUNTER — Emergency Department (HOSPITAL_COMMUNITY)
Admission: EM | Admit: 2013-10-14 | Discharge: 2013-10-14 | Disposition: A | Discharge status: Home / Self Care | Payer: Medicaid Other | Attending: Emergency Medicine | Admitting: Emergency Medicine

## 2013-10-14 ENCOUNTER — Encounter (HOSPITAL_COMMUNITY): Payer: Self-pay | Admitting: Emergency Medicine

## 2013-10-14 DIAGNOSIS — B9789 Other viral agents as the cause of diseases classified elsewhere: Secondary | ICD-10-CM | POA: Insufficient documentation

## 2013-10-14 DIAGNOSIS — B349 Viral infection, unspecified: Secondary | ICD-10-CM

## 2013-10-14 DIAGNOSIS — F17200 Nicotine dependence, unspecified, uncomplicated: Secondary | ICD-10-CM | POA: Insufficient documentation

## 2013-10-14 DIAGNOSIS — J069 Acute upper respiratory infection, unspecified: Secondary | ICD-10-CM | POA: Insufficient documentation

## 2013-10-14 DIAGNOSIS — Z79899 Other long term (current) drug therapy: Secondary | ICD-10-CM | POA: Insufficient documentation

## 2013-10-14 LAB — RAPID STREP SCREEN (MED CTR MEBANE ONLY): Streptococcus, Group A Screen (Direct): NEGATIVE

## 2013-10-14 NOTE — ED Notes (Signed)
Started 2 days ago with sore throat, headache and nasal congestion.

## 2013-10-14 NOTE — ED Provider Notes (Signed)
CSN: [id number]     Arrival date & time 10/14/13  0904 History  This chart was scribed for non-physician practitioner, Raymon Mutton, PA-C working with Joya Gaskins, MD by Greggory Stallion, ED scribe. This patient was seen in room TR05C/TR05C and the patient's care was started at 10:09 AM.    Chief Complaint  Patient presents with  . Sore Throat   The history is provided by the patient. No language interpreter was used.   HPI Comments: Marcia Jackson is a 23 y.o. female who presents to the Emergency Department complaining of gradual onset, constant sore throat, nasal congestion, green rhinorrhea and headache that started 2 days ago. Pt has also had mild productive cough of green sputum. She has had intermittent, sharp lower abdominal pains but denies it currently. Pt has taken ibuprofen with little relief. Denies fever, blurred vision, neck pain, neck stiffness, chest pain, SOB, difficulty breathing, nausea, emesis, diarrhea, difficulty urinating. Denies sick contacts.   History reviewed. No pertinent past medical history. History reviewed. No pertinent past surgical history. No family history on file. History  Substance Use Topics  . Smoking status: Current Some Day Smoker  . Smokeless tobacco: Not on file  . Alcohol Use: Yes   OB History   Grav Para Term Preterm Abortions TAB SAB Ect Mult Living                 Review of Systems  Constitutional: Negative for fever.  HENT: Positive for congestion, rhinorrhea and sore throat.   Eyes: Negative for visual disturbance.  Respiratory: Positive for cough. Negative for shortness of breath.   Cardiovascular: Negative for chest pain.  Gastrointestinal: Negative for nausea, vomiting, abdominal pain and diarrhea.  Genitourinary: Negative for difficulty urinating.  Musculoskeletal: Negative for neck pain and neck stiffness.  Neurological: Positive for headaches.  All other systems reviewed and are negative.   Allergies  Review of  patient's allergies indicates no known allergies.  Home Medications   Current Outpatient Rx  Name  Route  Sig  Dispense  Refill  . ibuprofen (ADVIL,MOTRIN) 200 MG tablet   Oral   Take 400 mg by mouth every 6 (six) hours as needed for mild pain.         Marland Kitchen norethindrone-ethinyl estradiol-iron (ESTROSTEP FE,TILIA FE,TRI-LEGEST FE) 1-20/1-30/1-35 MG-MCG tablet   Oral   Take 1 tablet by mouth daily.          BP 104/59  Pulse 64  Temp(Src) 98.3 F (36.8 C) (Oral)  Resp 20  SpO2 100%  LMP 09/30/2013  Physical Exam  Nursing note and vitals reviewed. Constitutional: She is oriented to person, place, and time. She appears well-developed and well-nourished. No distress.  HENT:  Head: Normocephalic and atraumatic.  Nose: Right sinus exhibits no maxillary sinus tenderness and no frontal sinus tenderness. Left sinus exhibits no maxillary sinus tenderness and no frontal sinus tenderness.  Mouth/Throat: Oropharynx is clear and moist. No oropharyngeal exudate.  Negative swelling, erythema, inflammation, lesions, sores, petechiae noted to posterior oropharynx, soft palate, tonsils. Negative exudate identified. Negative tonsillar adenopathy identified. Negative trismus. Uvula midline, symmetrical elevation.  Eyes: Conjunctivae and EOM are normal. Pupils are equal, round, and reactive to light. Right eye exhibits no discharge. Left eye exhibits no discharge.  Neck: Normal range of motion. Neck supple. No tracheal deviation present.  Negative neck stiffness Negative nuchal rigidity Negative cervical lymphadenopathy  Cardiovascular: Normal rate, regular rhythm and normal heart sounds.   Pulses:      Radial  pulses are 2+ on the right side, and 2+ on the left side.  Pulmonary/Chest: Effort normal and breath sounds normal. No respiratory distress. She has no wheezes. She has no rhonchi. She has no rales.  Musculoskeletal: Normal range of motion.  Full ROM to upper and lower extremities without  difficulty noted, negative ataxia noted.  Lymphadenopathy:    She has no cervical adenopathy.  Neurological: She is alert and oriented to person, place, and time. No cranial nerve deficit. She exhibits normal muscle tone. Coordination normal.  Skin: Skin is warm and dry.  Psychiatric: She has a normal mood and affect. Her behavior is normal.    ED Course  Procedures (including critical care time)  DIAGNOSTIC STUDIES: Oxygen Saturation is 100% on RA, normal by my interpretation.    COORDINATION OF CARE: 10:13 AM-Discussed treatment plan which includes ibuprofen with pt at bedside and pt agreed to plan. Return precautions given to patient.   Results for orders placed during the hospital encounter of 10/14/13  RAPID STREP SCREEN      Result Value Ref Range   Streptococcus, Group A Screen (Direct) NEGATIVE  NEGATIVE    Labs Review Labs Reviewed  RAPID STREP SCREEN  CULTURE, GROUP A STREP   Imaging Review No results found.   EKG Interpretation None      MDM   Final diagnoses:  URI (upper respiratory infection)  Viral syndrome   Filed Vitals:   10/14/13 0909  BP: 104/59  Pulse: 64  Temp: 98.3 F (36.8 C)  TempSrc: Oral  Resp: 20  SpO2: 100%   I personally performed the services described in this documentation, which was scribed in my presence. The recorded information has been reviewed and is accurate.  Patient presenting to the ED with nasal congestion, productive cough, sore throat, headache that has been ongoing for the past 2 days. Stated that she's been using ibuprofen with minimal relief. Reports that her mother is sick with similar symptoms. Alert and oriented. GCS 15. Heart rate and rhythm normal. Lungs good auscultation to upper and lower lobes bilaterally. Patient able to speak in full sentences without difficulty-negative use of accessory muscles. Negative nuchal rigidity, negative neck stiffness-negative pain upon palpation to the neck-negative cervical  lymphadenopathy. Negative pre-or postauricular swelling. Cap refill less than 3 seconds. Radial pulses 2+ bilaterally. Unremarkable oral exam-negative swelling, erythema, lesions, sores, exudate identified. Negative trismus. Uvula midline, symmetrical elevation. Rapid strep test negative. Doubt streptococcal pharyngitis. Doubt peritonsillar abscess. Doubt pneumonia. Doubt pneumothorax. Suspicion to be viral syndrome, upper respiratory infection. Patient stable, afebrile. Discharged patient. Discussed with patient to rest and stay hydrated. Discussed with patient to avoid physical strenuous activity. Referred to urgent care. Discussed with patient to closely monitor symptoms and if symptoms are to worsen or change to report back to the ED - strict return instructions given.  Patient agreed to plan of care, understood, all questions answered.   Raymon MuttonMarissa Baneza Bartoszek, PA-C 10/15/13 1310

## 2013-10-14 NOTE — Discharge Instructions (Signed)
Please call your doctor for a followup appointment within 24-48 hours. When you talk to your doctor please let them know that you were seen in the emergency department and have them acquire all of your records so that they can discuss the findings with you and formulate a treatment plan to fully care for your new and ongoing problems. Please call and set up appointment with urgent care to be reassessed Please rest and stay hydrated Please use ibuprofen as needed for discomfort-no more than 1200 mg per day Please avoid any physical or strenuous activity Please drink plenty of fluids Please continue to monitor symptoms closely if symptoms are to worsen or change (fever greater than 101, chills, chest pain, shortness of breath, difficulty breathing, nausea, vomiting, diarrhea, blood in the stools, inability to swallow, throat closing sensation, worsening or changes to headaches, blurred vision or visual changes) please report back to emergency department immediately  Upper Respiratory Infection, Adult An upper respiratory infection (URI) is also known as the common cold. It is often caused by a type of germ (virus). Colds are easily spread (contagious). You can pass it to others by kissing, coughing, sneezing, or drinking out of the same glass. Usually, you get better in 1 or 2 weeks.  HOME CARE   Only take medicine as told by your doctor.  Use a warm mist humidifier or breathe in steam from a hot shower.  Drink enough water and fluids to keep your pee (urine) clear or pale yellow.  Get plenty of rest.  Return to work when your temperature is back to normal or as told by your doctor. You may use a face mask and wash your hands to stop your cold from spreading. GET HELP RIGHT AWAY IF:   After the first few days, you feel you are getting worse.  You have questions about your medicine.  You have chills, shortness of breath, or brown or red spit (mucus).  You have yellow or brown snot (nasal  discharge) or pain in the face, especially when you bend forward.  You have a fever, puffy (swollen) neck, pain when you swallow, or white spots in the back of your throat.  You have a bad headache, ear pain, sinus pain, or chest pain.  You have a high-pitched whistling sound when you breathe in and out (wheezing).  You have a lasting cough or cough up blood.  You have sore muscles or a stiff neck. MAKE SURE YOU:   Understand these instructions.  Will watch your condition.  Will get help right away if you are not doing well or get worse. Document Released: 01/15/2008 Document Revised: 10/21/2011 Document Reviewed: 12/03/2010 [facility] Patient Information 2014 [place], [place].   Emergency Department Resource Guide 1) Find a Doctor and Pay Out of Pocket Although you won't have to find out who is covered by your insurance plan, it is a good idea to ask around and get recommendations. You will then need to call the office and see if the doctor you have chosen will accept you as a new patient and what types of options they offer for patients who are self-pay. Some doctors offer discounts or will set up payment plans for their patients who do not have insurance, but you will need to ask so you aren't surprised when you get to your appointment.  2) Contact Your Local Health Department Not all health departments have doctors that can see patients for sick visits, but many do, so it is worth a  call to see if yours does. If you don't know where your local health department is, you can check in your phone book. The CDC also has a tool to help you locate your state's health department, and many state websites also have listings of all of their local health departments.  3) Find a Walk-in Clinic If your illness is not likely to be very severe or complicated, you may want to try a walk in clinic. These are popping up all over the country in pharmacies, drugstores, and shopping centers. They're usually  staffed by nurse practitioners or physician assistants that have been trained to treat common illnesses and complaints. They're usually fairly quick and inexpensive. However, if you have serious medical issues or chronic medical problems, these are probably not your best option.  No Primary Care Doctor: - Call Health Connect at  [phone] - they can help you locate a primary care doctor that  accepts your insurance, provides certain services, etc. - Physician Referral Service- [phone]  Chronic Pain Problems: Organization         Address  Phone   Notes  [facility]  [phone] Patients need to be referred by their primary care doctor.   Medication Assistance: Organization         Address  Phone   Notes  [facility] Medication [facility] [address] [place] [place], [place] [address] [phone] --Must be a resident of [facility] -- Must have NO insurance coverage whatsoever (no Medicaid/ Medicare, etc.) -- The pt. MUST have a primary care doctor that directs their care regularly and follows them in the community   MedAssist  [phone]   [employer]  [phone]    Agencies that provide inexpensive medical care: Organization         Address  Phone   Notes  [facility]  [phone]   [facility]    [phone]   [facility] [address] [place], [place] [address] [phone]   [facility] [address], [place] [phone]   Planned Parenthood    [phone]   [facility]    [phone]   [facility]  [address], [place] Phone:  [phone], Fax:  [phone] Hours of Operation:  9 am - 6 pm, M-F.  Also accepts Medicaid/Medicare and self-pay.  [facility] for [facility]  [address], [place] Phone: [phone], Fax: [phone]. Hours of  Operation:  8:30 am - 5:30 pm, M-F.  Also accepts Medicaid and self-pay.  [facility] [place] [address] [place] Phone: [phone]   [facility] [address] Natasha Bence [place], [place] [phone], Ext. [phone] Mondays & Thursdays: 7-9 AM.  First 15 patients are seen on a first come, first serve basis.    Medicaid-accepting [facility] Providers:  Organization         Address  Phone   Notes  [facility] [address], [place] [phone] Also accepts self-pay patients.  [facility] [address] Laurell Josephs [place], [place]  [phone]   [facility] [address], [place] [phone]   [facility] [address], [place] [phone]   Renaye Rakers [address], [place],  [place]   [phone] Only accepts [place] [employer] Medicaid patients after they have their name applied to their card.   Self-Pay (no insurance) in [facility]:  Organization         Address  Phone   Notes  Sickle Cell Patients, [facility] [address], [place] [phone]   [facility] [address], [place] [phone]) [phone]   [facility]  [address], [place] [phone]   [facility]/Dr. Osei-Bonsu  [address] [place] [address], [place] or [phone], [place] [phone] Phone number for both [place] and [place] locations is the same.  [facility] [address], [place] [phone]   [facility] [address] [place] [address], [place] or [address] [phone]) [phone]) [phone]   [facility] [address], [place] [phone], phone; [phone](336) [phone], fax Sees patients 1st and 3rd Saturday of every month.  Must not qualify for public or private insurance (i.e. Medicaid, Medicare,  [place] Health Choice, Veterans' Benefits)  Household income should be no more than 200% of the poverty level The clinic cannot treat you if you are pregnant or think you are pregnant  Sexually transmitted diseases are not treated at the clinic.    Dental Care: Organization         Address  Phone  Notes  [facility] [address], [place] [phone] Accepts children up to age 22 who are enrolled in [place] or [place] Health Choice; pregnant women with a Medicaid card; and children who have applied for Medicaid or [place] Health Choice, but were declined, whose parents can pay a reduced fee at time of service.  [facility] [place]  [address], [place] [phone] Accepts children up to age 22 who are enrolled in [place] or [place] Health Choice; pregnant women with a Medicaid card; and children who have applied for Medicaid or [place] Health Choice, but were declined, whose parents can pay a reduced fee at time of service.  [facility]  [address], [place] [phone]) [phone] Patients are seen by appointment only. Walk-ins are not accepted. [facility] will see patients 22 years of age and older. Monday - Tuesday (8am-5pm) Most Wednesdays (8:30-5pm) $30 per visit, cash only  [facility]  [address], [place] [phone] Patients are seen by appointment only. Walk-ins are not accepted. [facility] will see patients 22 years of age and older. One Wednesday Evening (Monthly: Volunteer Based).  $30 per visit, cash only  [employer] [facility]  [phone] for adults; Children under age 344, call Graduate Pediatric Dentistry at [phone]. Children aged 664-14, please call [phone] to request a pediatric application.  Dental services are provided in all areas of dental care including fillings, crowns and bridges,  complete and partial dentures, implants, gum treatment, root canals, and extractions. Preventive care is also provided. Treatment is provided to both adults and children. Patients are selected via a lottery and there is often a waiting list.   [facility] [address], [place]  [phone] [email or web address]   [facility] [address], [place], [place] [phone], Ext. [phone] Second and Fourth Thursday of each month,  opens at 6:30 AM; Clinic ends at 9 AM.  Patients are seen on a first-come first-served basis, and a limited number are seen during each clinic.   [facility]  [address] Ether Griffins [place], [place] [phone]   Eligibility Requirements You must have lived in [place], [place], or [place] counties for at least the last three months.   You cannot be eligible for state or federal sponsored [place], including [employer], [place], or [employer].   You generally cannot be eligible for healthcare insurance through your employer.    How to apply: Eligibility screenings are held every Tuesday and Wednesday afternoon from 1:00 pm until 4:00 pm. You do not need an appointment for the interview!  [facility] [address], [place], [place] [phone]   [facility] Health Department  [phone]   [facility] Health Department  [phone]   [facility] Health Department  [phone]    Behavioral Health Resources in the Community: Intensive Outpatient Programs Organization         Address  Phone  Notes  [facility] Services [address], [place], [place] [phone]   [facility] [address], [place], [place] [phone]   [facility] [address] [place], [place]  [phone]   [facility] Mental Health [address],  [place], [place] [phone] or [phone]   Substance Abuse Resources Organization          Address  Phone  Notes  [facility]  [phone]   [facility]  [phone]   [facility] [place]  [phone]   Floydene Flock  [phone]   Residential & Outpatient Substance Abuse Program  [phone]   Psychological Services Organization         Address  Phone  Notes  [facility] Behavioral Health  [phone]   [facility]  [phone]   [facility] Mental Health [address], [place] [phone] or [phone]    Mobile Crisis Teams Organization         Address  Phone  Notes  [facility], Mobile Crisis Care Unit  [phone]   [facility]  [address] [place], [place] [phone]   Doristine Locks [address] [place] [phone]    Self-Help/Support Groups Organization         Address  Phone             Notes  [facility] [place] - variety of support groups  [phone]- I7437963 Call for more information  Narcotics Anonymous (NA), [facility] [address], [employer] [place]  2 meetings at this location   Statistician         Address  Phone  Notes  [facility] [address],    [place]  [phone]   [facility]  [address] [place] [address], [place], [place] [phone]   [facility] [address] [place], [place] [phone] Admissions: 8am-3pm M-F  [facility] [address],    [place], [place] [phone]   [facility] [address] Starling Manns [place], [place] [phone]   [facility] [address]  [place], [place] [phone]   [facility] - Intensive Outpatient [address]ure[address], [place], [place] [phone]   [facility] ([facility]iction [facility] [address]  [place], [place] [phone]  or [phone]   [facility] ([facility]) [address], [place], [place]  [phone] Accepts Medicaid  [facility] [place] [address]  [place] [phone] Substance Abuse/Addiction Treatment   [facility] Organization         Address  Phone  Notes  [facility]  [phone]   Angie Fava, PhD [address] Ervin Knack [place], [place]   [phone] or [phone]   [facility] Behavioral   [address] [place], [place] [phone]   [facility] [address] [place], [place] [phone] Insurance/Medicaid/sponsorship through [facility] [address]                                    [place], [place] [phone] Therapy/tele-psych/case  [facility] [address], [place] [phone]    Dr. Lolly Mustache  [phone]   [facility]  [employer] [facility] Dept. 1) [address] [place] 2) [address], [place] 3)  [address], [place] [phone]  [phone]   [facility] Child Abuse Hotline 801-093-6293 or [phone] (After Hours)

## 2013-10-16 LAB — CULTURE, GROUP A STREP

## 2013-10-16 NOTE — ED Provider Notes (Signed)
Medical screening examination/treatment/procedure(s) were performed by non-physician practitioner and as supervising physician I was immediately available for consultation/collaboration.   EKG Interpretation None        Joya Gaskinsonald W Tearia Gibbs, MD 10/16/13 1946

## 2013-10-18 ENCOUNTER — Encounter (HOSPITAL_COMMUNITY): Payer: Self-pay | Admitting: Emergency Medicine

## 2013-10-18 ENCOUNTER — Emergency Department (HOSPITAL_COMMUNITY)
Admission: EM | Admit: 2013-10-18 | Discharge: 2013-10-18 | Disposition: A | Discharge status: Home / Self Care | Payer: Medicaid Other | Attending: Emergency Medicine | Admitting: Emergency Medicine

## 2013-10-18 DIAGNOSIS — Z79899 Other long term (current) drug therapy: Secondary | ICD-10-CM | POA: Insufficient documentation

## 2013-10-18 DIAGNOSIS — R102 Pelvic and perineal pain: Secondary | ICD-10-CM

## 2013-10-18 DIAGNOSIS — N949 Unspecified condition associated with female genital organs and menstrual cycle: Secondary | ICD-10-CM | POA: Insufficient documentation

## 2013-10-18 DIAGNOSIS — F17200 Nicotine dependence, unspecified, uncomplicated: Secondary | ICD-10-CM | POA: Insufficient documentation

## 2013-10-18 DIAGNOSIS — Z3202 Encounter for pregnancy test, result negative: Secondary | ICD-10-CM | POA: Insufficient documentation

## 2013-10-18 LAB — URINALYSIS, ROUTINE W REFLEX MICROSCOPIC
Bilirubin Urine: NEGATIVE
Glucose, UA: NEGATIVE mg/dL
Hgb urine dipstick: NEGATIVE
Ketones, ur: NEGATIVE mg/dL
Nitrite: NEGATIVE
Protein, ur: NEGATIVE mg/dL
Specific Gravity, Urine: 1.029 (ref 1.005–1.030)
UROBILINOGEN UA: 0.2 mg/dL (ref 0.0–1.0)
pH: 5.5 (ref 5.0–8.0)

## 2013-10-18 LAB — WET PREP, GENITAL
TRICH WET PREP: NONE SEEN
Yeast Wet Prep HPF POC: NONE SEEN

## 2013-10-18 LAB — URINE MICROSCOPIC-ADD ON

## 2013-10-18 LAB — PREGNANCY, URINE: PREG TEST UR: NEGATIVE

## 2013-10-18 MED ORDER — IBUPROFEN 800 MG PO TABS
800.0000 mg | ORAL_TABLET | Freq: Once | ORAL | Status: AC
  Administered 2013-10-18: 800 mg via ORAL
  Filled 2013-10-18: qty 1

## 2013-10-18 NOTE — Discharge Instructions (Signed)
If you were given medicines take as directed.  If you are on coumadin or contraceptives realize their levels and effectiveness is altered by many different medicines.  If you have any reaction (rash, tongues swelling, other) to the medicines stop taking and see a physician.   Please follow up as directed and return to the ER or see a physician for new or worsening symptoms (right lower abdominal pain, fevers, recurrent vomiting, vaginal bleeding, other).  Thank you. Take ibuprofen and tylenol for pain.

## 2013-10-18 NOTE — ED Provider Notes (Signed)
CSN: [id number]     Arrival date & time 10/18/13  [id number] History   First MD Initiated Contact with Patient 10/18/13 [phone]     Chief Complaint  Patient presents with  . Abdominal Pain     (Consider location/radiation/quality/duration/timing/severity/associated sxs/prior Treatment) HPI Comments: 23 yo female with smoking hx, vaginal delivery hx presents with lower abdo pain intermittent for weeks, crampy, no vaginal bleeding or discharge, worse suprapubic, no STD or PID hx, no fevers or vomiting. Mild nausea.  Pt unsure if pregnant, she is sexually active. Pain non radiating.  The history is provided by the patient.    History reviewed. No pertinent past medical history. History reviewed. No pertinent past surgical history. History reviewed. No pertinent family history. History  Substance Use Topics  . Smoking status: Current Every Day Smoker -- 0.50 packs/day    Types: Cigarettes  . Smokeless tobacco: Not on file  . Alcohol Use: Yes   OB History   Grav Para Term Preterm Abortions TAB SAB Ect Mult Living                 Review of Systems  Constitutional: Negative for fever and chills.  HENT: Negative for congestion.   Eyes: Negative for visual disturbance.  Respiratory: Negative for shortness of breath.   Cardiovascular: Negative for chest pain.  Gastrointestinal: Positive for abdominal pain. Negative for vomiting.  Genitourinary: Negative for dysuria, flank pain, vaginal bleeding and vaginal discharge.  Musculoskeletal: Negative for back pain, neck pain and neck stiffness.  Skin: Negative for rash.  Neurological: Negative for light-headedness and headaches.      Allergies  Review of patient's allergies indicates no known allergies.  Home Medications   Current Outpatient Rx  Name  Route  Sig  Dispense  Refill  . ibuprofen (ADVIL,MOTRIN) 200 MG tablet   Oral   Take 400 mg by mouth every 6 (six) hours as needed for mild pain.         Marland Kitchen. norethindrone-ethinyl  estradiol-iron (ESTROSTEP FE,TILIA FE,TRI-LEGEST FE) 1-20/1-30/1-35 MG-MCG tablet   Oral   Take 1 tablet by mouth daily.          BP 103/69  Pulse 81  Temp(Src) 97.3 F (36.3 C) (Oral)  Resp 18  SpO2 100%  LMP 09/30/2013 Physical Exam  Nursing note and vitals reviewed. Constitutional: She is oriented to person, place, and time. She appears well-developed and well-nourished.  HENT:  Head: Normocephalic and atraumatic.  Eyes: Conjunctivae are normal. Right eye exhibits no discharge. Left eye exhibits no discharge.  Neck: Normal range of motion. Neck supple. No tracheal deviation present.  Cardiovascular: Normal rate and regular rhythm.   Pulmonary/Chest: Effort normal and breath sounds normal.  Abdominal: Soft. She exhibits no distension. There is tenderness (pelvic worse midline). There is no guarding.  Genitourinary:  No CMT, mild discharge, non tender, no bleeding  Musculoskeletal: She exhibits no edema.  Neurological: She is alert and oriented to person, place, and time.  Skin: Skin is warm. No rash noted.  Psychiatric: She has a normal mood and affect.    ED Course  Procedures (including critical care time) Labs Review Labs Reviewed  WET PREP, GENITAL - Abnormal; Notable for the following:    Clue Cells Wet Prep HPF POC FEW (*)    WBC, Wet Prep HPF POC FEW (*)    All other components within normal limits  URINALYSIS, ROUTINE W REFLEX MICROSCOPIC - Abnormal; Notable for the following:    APPearance TURBID (*)  Leukocytes, UA MODERATE (*)    All other components within normal limits  URINE MICROSCOPIC-ADD ON - Abnormal; Notable for the following:    Squamous Epithelial / LPF MANY (*)    Bacteria, UA FEW (*)    All other components within normal limits  GC/CHLAMYDIA PROBE AMP  PREGNANCY, URINE   Imaging Review No results found.   EKG Interpretation None      MDM   Final diagnoses:  Pelvic pain   Well appearing. Recurrent sxs for weeks.  No concern  for PID or torsion at this time, not pregnant. Close fup with OBGYN outpt.  Results and differential diagnosis were discussed with the patient. Close follow up outpatient was discussed, patient comfortable with the plan.         Enid Skeens, MD 10/18/13 [phone]

## 2013-10-18 NOTE — ED Notes (Signed)
Pt from home, states she woke up this am with nausea and lower abd pain. Pt states she may be pregnant. Denies vomiting and diarrhea.

## 2013-10-18 NOTE — ED Notes (Signed)
MD at bedside to perform pelvic exam.

## 2013-10-19 LAB — GC/CHLAMYDIA PROBE AMP
CT PROBE, AMP APTIMA: NEGATIVE
GC Probe RNA: NEGATIVE

## 2013-11-30 ENCOUNTER — Emergency Department (HOSPITAL_COMMUNITY)
Admission: EM | Admit: 2013-11-30 | Discharge: 2013-11-30 | Disposition: A | Discharge status: Home / Self Care | Payer: Medicaid Other | Attending: Emergency Medicine | Admitting: Emergency Medicine

## 2013-11-30 ENCOUNTER — Encounter (HOSPITAL_COMMUNITY): Payer: Self-pay | Admitting: Emergency Medicine

## 2013-11-30 DIAGNOSIS — Z79899 Other long term (current) drug therapy: Secondary | ICD-10-CM | POA: Insufficient documentation

## 2013-11-30 DIAGNOSIS — J020 Streptococcal pharyngitis: Secondary | ICD-10-CM | POA: Insufficient documentation

## 2013-11-30 DIAGNOSIS — F17200 Nicotine dependence, unspecified, uncomplicated: Secondary | ICD-10-CM | POA: Insufficient documentation

## 2013-11-30 LAB — RAPID STREP SCREEN (MED CTR MEBANE ONLY): STREPTOCOCCUS, GROUP A SCREEN (DIRECT): POSITIVE — AB

## 2013-11-30 MED ORDER — PENICILLIN G BENZATHINE 1200000 UNIT/2ML IM SUSP
1.2000 10*6.[IU] | Freq: Once | INTRAMUSCULAR | Status: AC
  Administered 2013-11-30: 1.2 10*6.[IU] via INTRAMUSCULAR
  Filled 2013-11-30: qty 2

## 2013-11-30 NOTE — ED Provider Notes (Signed)
CSN: [id number]     Arrival date & time 11/30/13  0903 History  This chart was scribed for non-physician practitioner Emilia BeckKaitlyn Malerie Eakins, PA-C working with Layla MawKristen N Ward, DO by Leone PayorSonum Patel, ED Scribe. This patient was seen in room TR05C/TR05C and the patient's care was started at 10:14 AM.    Chief Complaint  Patient presents with  . Sore Throat  . Headache      The history is provided by the patient. No language interpreter was used.    HPI Comments: Marcia Jackson is a 23 y.o. female who presents to the Emergency Department complaining of 1 day of gradual onset, gradually worsening, constant sore throat with associated HA. She has a low grade fever of 99.4 in the ED. She states the pain is worse with swallowing. She has not tried any OTC medications for her symptoms. She denies any other complaints.   History reviewed. No pertinent past medical history. History reviewed. No pertinent past surgical history. No family history on file. History  Substance Use Topics  . Smoking status: Current Every Day Smoker -- 0.50 packs/day    Types: Cigarettes  . Smokeless tobacco: Not on file  . Alcohol Use: Yes   OB History   Grav Para Term Preterm Abortions TAB SAB Ect Mult Living                 Review of Systems  Constitutional: Positive for fever.  HENT: Positive for sore throat. Negative for trouble swallowing.   Neurological: Positive for headaches.  All other systems reviewed and are negative.     Allergies  Review of patient's allergies indicates no known allergies.  Home Medications   Prior to Admission medications   Medication Sig Start Date End Date Taking? Authorizing Provider  ibuprofen (ADVIL,MOTRIN) 200 MG tablet Take 400 mg by mouth every 6 (six) hours as needed for mild pain.    Historical Provider, MD  norethindrone-ethinyl estradiol-iron (ESTROSTEP FE,TILIA FE,TRI-LEGEST FE) 1-20/1-30/1-35 MG-MCG tablet Take 1 tablet by mouth daily.    Historical Provider, MD    BP 107/67  Pulse 102  Temp(Src) 99.4 F (37.4 C) (Oral)  Resp 18  Ht 5\' 3"  (1.6 m)  Wt 98 lb (44.453 kg)  BMI 17.36 kg/m2  SpO2 100%  LMP 10/11/2013 Physical Exam  Nursing note and vitals reviewed. Constitutional: She is oriented to person, place, and time. She appears well-developed and well-nourished.  HENT:  Head: Normocephalic and atraumatic.  Right Ear: External ear normal.  Left Ear: External ear normal.  Mouth/Throat: Posterior oropharyngeal erythema present. No oropharyngeal exudate, posterior oropharyngeal edema or tonsillar abscesses.  Neck: Normal range of motion. Neck supple.  Cardiovascular: Normal rate.   Pulmonary/Chest: Effort normal.  Abdominal: She exhibits no distension.  Neurological: She is alert and oriented to person, place, and time.  Skin: Skin is warm and dry.  Psychiatric: She has a normal mood and affect.    ED Course  Procedures (including critical care time)  DIAGNOSTIC STUDIES: Oxygen Saturation is 100% on RA, normal by my interpretation.    COORDINATION OF CARE: 10:13 AM Discussed positive strep screen. Will treat with Bicillin La Discussed treatment plan with pt at bedside and pt agreed to plan.   Labs Review Labs Reviewed  RAPID STREP SCREEN - Abnormal; Notable for the following:    Streptococcus, Group A Screen (Direct) POSITIVE (*)    All other components within normal limits    Imaging Review No results found.   EKG Interpretation  None      MDM   Final diagnoses:  Strep throat    Patient has positive strep and will be treated accordingly. Vitals stable and patient afebrile.   I personally performed the services described in this documentation, which was scribed in my presence. The recorded information has been reviewed and is accurate.   Emilia BeckKaitlyn Sohan Potvin, [place] 12/01/13 [phone]

## 2013-11-30 NOTE — Discharge Instructions (Signed)
You have been treated in the ED for strep throat. Take tylenol as needed for fever and pain. Refer to attached documents for more information.

## 2013-11-30 NOTE — ED Notes (Signed)
Pt reports sore throat and headache since last night. No fever.

## 2013-12-01 NOTE — ED Provider Notes (Signed)
Medical screening examination/treatment/procedure(s) were performed by non-physician practitioner and as supervising physician I was immediately available for consultation/collaboration.   EKG Interpretation None        Kristen N Ward, DO 12/01/13 0913 

## 2014-02-07 ENCOUNTER — Emergency Department (INDEPENDENT_AMBULATORY_CARE_PROVIDER_SITE_OTHER)
Admission: EM | Admit: 2014-02-07 | Discharge: 2014-02-07 | Disposition: A | Discharge status: Home / Self Care | Payer: Medicaid Other | Source: Home / Self Care

## 2014-02-07 ENCOUNTER — Encounter (HOSPITAL_COMMUNITY): Payer: Self-pay | Admitting: Emergency Medicine

## 2014-02-07 DIAGNOSIS — R141 Gas pain: Secondary | ICD-10-CM

## 2014-02-07 DIAGNOSIS — R143 Flatulence: Secondary | ICD-10-CM

## 2014-02-07 DIAGNOSIS — K6389 Other specified diseases of intestine: Secondary | ICD-10-CM

## 2014-02-07 DIAGNOSIS — K639 Disease of intestine, unspecified: Secondary | ICD-10-CM

## 2014-02-07 DIAGNOSIS — R142 Eructation: Secondary | ICD-10-CM

## 2014-02-07 LAB — POCT URINALYSIS DIP (DEVICE)
GLUCOSE, UA: NEGATIVE mg/dL
Hgb urine dipstick: NEGATIVE
Leukocytes, UA: NEGATIVE
NITRITE: NEGATIVE
Protein, ur: 100 mg/dL — AB
Urobilinogen, UA: 1 mg/dL (ref 0.0–1.0)
pH: 5.5 (ref 5.0–8.0)

## 2014-02-07 LAB — POCT PREGNANCY, URINE: PREG TEST UR: NEGATIVE

## 2014-02-07 NOTE — ED Notes (Signed)
C/o left side flank pain which started this morning States she does have some nausea Denies any urinary problems or diarrhea

## 2014-02-07 NOTE — ED Provider Notes (Signed)
CSN: [id number]     Arrival date & time 02/07/14  [id number] History   First MD Initiated Contact with Patient 02/07/14 1057     Chief Complaint  Patient presents with  . Flank Pain   (Consider location/radiation/quality/duration/timing/severity/associated sxs/prior Treatment) HPI Comments: 0600h acute onset of LUQ pain. No radiation. Nausea no vomiting. No fever. Felt like needed to have BM. [facility]   History reviewed. No pertinent past medical history. History reviewed. No pertinent past surgical history. History reviewed. No pertinent family history. History  Substance Use Topics  . Smoking status: Current Every Day Smoker -- 0.50 packs/day    Types: Cigarettes  . Smokeless tobacco: Not on file  . Alcohol Use: Yes   OB History   Grav Para Term Preterm Abortions TAB SAB Ect Mult Living                 Review of Systems  Constitutional: Positive for activity change. Negative for fever and fatigue.  HENT: Negative.   Respiratory: Negative.   Cardiovascular: Negative for chest pain.  Gastrointestinal: Positive for nausea and abdominal pain. Negative for vomiting, diarrhea, constipation, blood in stool and abdominal distention.  Genitourinary: Negative.   Musculoskeletal: Negative.   Skin: Negative.   Neurological: Negative.     Allergies  Review of patient's allergies indicates no known allergies.  Home Medications   Prior to Admission medications   Medication Sig Start Date End Date Taking? Authorizing Provider  ibuprofen (ADVIL,MOTRIN) 200 MG tablet Take 400 mg by mouth every 6 (six) hours as needed for mild pain.    Historical Provider, MD  norethindrone-ethinyl estradiol-iron (ESTROSTEP FE,TILIA FE,TRI-LEGEST FE) 1-20/1-30/1-35 MG-MCG tablet Take 1 tablet by mouth daily.    Historical Provider, MD   BP 88/62  Pulse 71  Temp(Src) 97.6 F (36.4 C) (Oral)  Resp 12  SpO2 96% Physical Exam  Nursing note and vitals  reviewed. Constitutional: She is oriented to person, place, and time. She appears well-developed. No distress.  Underweight, thin. No signs of emaciation.  Eyes: Conjunctivae and EOM are normal.  Neck: Normal range of motion. Neck supple.  Cardiovascular: Normal rate and regular rhythm.   Pulmonary/Chest: Effort normal and breath sounds normal.  Abdominal: Soft. Bowel sounds are normal. She exhibits no distension and no mass. There is no tenderness. There is no rebound and no guarding.  Most of abdomen percusses tympanic. LUQ palpates as "sensitive" but denies pain or tenderness.   Musculoskeletal: She exhibits no edema.  Neurological: She is alert and oriented to person, place, and time.  Skin: Skin is warm and dry.  Psychiatric: She has a normal mood and affect.    ED Course  Procedures (including critical care time) Labs Review Labs Reviewed  POCT URINALYSIS DIP (DEVICE) - Abnormal; Notable for the following:    Bilirubin Urine SMALL (*)    Ketones, ur TRACE (*)    Protein, ur 100 (*)    All other components within normal limits  POCT PREGNANCY, URINE   Results for orders placed during the hospital encounter of 02/07/14  POCT URINALYSIS DIP (DEVICE)      Result Value Ref Range   Glucose, UA NEGATIVE  NEGATIVE mg/dL   Bilirubin Urine SMALL (*) NEGATIVE   Ketones, ur TRACE (*) NEGATIVE mg/dL   Specific Gravity, Urine >=1.030  1.005 - 1.030   Hgb urine dipstick NEGATIVE  NEGATIVE   pH 5.5  5.0 - 8.0   Protein, ur 100 (*)  NEGATIVE mg/dL   Urobilinogen, UA 1.0  0.0 - 1.0 mg/dL   Nitrite NEGATIVE  NEGATIVE   Leukocytes, UA NEGATIVE  NEGATIVE  POCT PREGNANCY, URINE      Result Value Ref Range   Preg Test, Ur NEGATIVE  NEGATIVE    Imaging Review No results found.   MDM   1. Abdominal gas pain   2. Splenic flexure syndrome     INcrease fiber and fluids in diet. More protein.  Gas X For worsening and ref flags we discussed go to the ED      Hayden Rasmussenavid Mabe,  NP 02/07/14 1125

## 2014-02-09 NOTE — ED Provider Notes (Signed)
Medical screening examination/treatment/procedure(s) were performed by non-physician practitioner and as supervising physician I was immediately available for consultation/collaboration.  Leslee Homeavid Keller, M.D.  Reuben Likesavid C Keller, MD 02/09/14 [phone]

## 2014-03-04 ENCOUNTER — Encounter (HOSPITAL_COMMUNITY): Payer: Self-pay | Admitting: Emergency Medicine

## 2014-03-04 DIAGNOSIS — F17200 Nicotine dependence, unspecified, uncomplicated: Secondary | ICD-10-CM | POA: Insufficient documentation

## 2014-03-04 DIAGNOSIS — J029 Acute pharyngitis, unspecified: Secondary | ICD-10-CM | POA: Insufficient documentation

## 2014-03-04 DIAGNOSIS — M542 Cervicalgia: Secondary | ICD-10-CM | POA: Diagnosis present

## 2014-03-04 NOTE — ED Notes (Signed)
Patient presents with c/o pain to the left side of her neck and the pain radiates up into her left ear.  States has been going on for about 1 week but has continually gotten worse.

## 2014-03-05 ENCOUNTER — Emergency Department (HOSPITAL_COMMUNITY)
Admission: EM | Admit: 2014-03-05 | Discharge: 2014-03-05 | Disposition: A | Discharge status: Home / Self Care | Payer: Medicaid Other | Attending: Emergency Medicine | Admitting: Emergency Medicine

## 2014-03-05 DIAGNOSIS — J029 Acute pharyngitis, unspecified: Secondary | ICD-10-CM

## 2014-03-05 LAB — RAPID STREP SCREEN (MED CTR MEBANE ONLY): STREPTOCOCCUS, GROUP A SCREEN (DIRECT): NEGATIVE

## 2014-03-05 MED ORDER — HYDROCODONE-ACETAMINOPHEN 7.5-325 MG/15ML PO SOLN: 10.0000 mL | Freq: Four times a day (QID) | ORAL | Status: DC | PRN

## 2014-03-05 MED ORDER — MAGIC MOUTHWASH
10.0000 mL | Freq: Once | ORAL | Status: AC
  Administered 2014-03-05: 10 mL via ORAL
  Filled 2014-03-05: qty 10

## 2014-03-05 NOTE — ED Provider Notes (Signed)
CSN: [id number]     Arrival date & time 03/04/14  2233 History   First MD Initiated Contact with Patient 03/05/14 0018     Chief Complaint  Patient presents with  . Neck Pain     (Consider location/radiation/quality/duration/timing/severity/associated sxs/prior Treatment) HPI  23 year old female presents with complaints of sore throat. Patient states for the past week she has been having pain intermittently from the left side of throat radiates up to her left year. Patient state pain is now constant, 7/10, and described as sore throat. Patient states no aggravating factors. She has tried gargle with saltwater and taking Benadryl with no relief. Patient reports dysphasia and left ear pain.  She denies chest pain, shortness of breath, cough, hemoptysis, or difficulty hearing. Denies any ringing in the ears. No nausea vomiting fever chills sweat abdominal pain and no posterior neck pain. Patient is a smoker and smoked one pack daily. She is up-to-date with immunization. No recent sick contact.  History reviewed. No pertinent past medical history. History reviewed. No pertinent past surgical history. History reviewed. No pertinent family history. History  Substance Use Topics  . Smoking status: Current Every Day Smoker -- 0.50 packs/day    Types: Cigarettes  . Smokeless tobacco: Never Used  . Alcohol Use: Yes     Comment: occasionally   OB History   Grav Para Term Preterm Abortions TAB SAB Ect Mult Living                 Review of Systems  Constitutional: Negative for fever.  HENT: Positive for ear pain and sore throat. Negative for tinnitus, trouble swallowing and voice change.   Skin: Negative for rash and wound.  All other systems reviewed and are negative.     Allergies  Review of patient's allergies indicates no known allergies.  Home Medications   Prior to Admission medications   Medication Sig Start Date End Date Taking? Authorizing Provider  ibuprofen (ADVIL,MOTRIN)  200 MG tablet Take 400 mg by mouth every 6 (six) hours as needed for mild pain.    Historical Provider, MD  norethindrone-ethinyl estradiol-iron (ESTROSTEP FE,TILIA FE,TRI-LEGEST FE) 1-20/1-30/1-35 MG-MCG tablet Take 1 tablet by mouth daily.    Historical Provider, MD   BP 101/67  Pulse 64  Temp(Src) 97.7 F (36.5 C) (Oral)  Resp 18  SpO2 98%  LMP 02/11/2014 Physical Exam  Nursing note and vitals reviewed. Constitutional: She is oriented to person, place, and time. She appears well-developed and well-nourished. No distress.  HENT:  Head: Atraumatic.  Right Ear: External ear normal.  Left Ear: External ear normal.  Mouth/Throat: Oropharynx is clear and moist. No oropharyngeal exudate.  Throat: Uvula is midline, no tonsillar enlargement or exudate. Mild posterior oropharyngeal erythema without evidence of trismus. No evidence of peritonsillar abscess or retropharyngeal abscess.  Eyes: Conjunctivae are normal.  Neck: Neck supple.  Cardiovascular: Normal rate and regular rhythm.   Pulmonary/Chest: Effort normal and breath sounds normal. She exhibits no tenderness.  Abdominal: Soft. There is no tenderness.  No splenomegaly noted.  Lymphadenopathy:    She has cervical adenopathy.  Neurological: She is alert and oriented to person, place, and time.  Skin: No rash noted.  Psychiatric: She has a normal mood and affect.    ED Course  Procedures (including critical care time)  12:33 AM Patient in with sore throat. Rapid strep obtained. No evidence of deep tissue infection. Magic mouthwash given for comfort. No airway compromise.  2:11 AM Strep test negative.  Will  provide sxs treatment with hycet.  Pt to continue gargle with salt water and f/u with ENT as needed for further care.    Labs Review Labs Reviewed  RAPID STREP SCREEN  CULTURE, GROUP A STREP    Imaging Review No results found.   EKG Interpretation None      MDM   Final diagnoses:  Acute pharyngitis,  unspecified pharyngitis type    BP 101/67  Pulse 64  Temp(Src) 97.7 F (36.5 C) (Oral)  Resp 18  SpO2 98%  LMP 02/11/2014  I have reviewed nursing notes and vital signs. I personally reviewed the imaging tests through PACS system  I reviewed available ER/hospitalization records thought the EMR     Fayrene Helper, [place] 03/05/14 1610

## 2014-03-05 NOTE — ED Provider Notes (Signed)
Medical screening examination/treatment/procedure(s) were performed by non-physician practitioner and as supervising physician I was immediately available for consultation/collaboration.   EKG Interpretation None        Courtney F Horton, MD 03/05/14 0959 

## 2014-03-05 NOTE — Discharge Instructions (Signed)

## 2014-03-07 LAB — CULTURE, GROUP A STREP

## 2014-04-28 ENCOUNTER — Encounter (HOSPITAL_COMMUNITY): Payer: Self-pay | Admitting: Emergency Medicine

## 2014-04-28 ENCOUNTER — Emergency Department (HOSPITAL_COMMUNITY)
Admission: EM | Admit: 2014-04-28 | Discharge: 2014-04-28 | Disposition: A | Discharge status: Home / Self Care | Payer: Medicaid Other | Attending: Emergency Medicine | Admitting: Emergency Medicine

## 2014-04-28 DIAGNOSIS — Z3202 Encounter for pregnancy test, result negative: Secondary | ICD-10-CM | POA: Insufficient documentation

## 2014-04-28 DIAGNOSIS — R109 Unspecified abdominal pain: Secondary | ICD-10-CM | POA: Diagnosis present

## 2014-04-28 DIAGNOSIS — F17200 Nicotine dependence, unspecified, uncomplicated: Secondary | ICD-10-CM | POA: Diagnosis not present

## 2014-04-28 DIAGNOSIS — R110 Nausea: Secondary | ICD-10-CM | POA: Insufficient documentation

## 2014-04-28 LAB — URINALYSIS, ROUTINE W REFLEX MICROSCOPIC
Bilirubin Urine: NEGATIVE
Glucose, UA: NEGATIVE mg/dL
Ketones, ur: NEGATIVE mg/dL
Leukocytes, UA: NEGATIVE
NITRITE: NEGATIVE
PROTEIN: NEGATIVE mg/dL
SPECIFIC GRAVITY, URINE: 1.018 (ref 1.005–1.030)
UROBILINOGEN UA: 0.2 mg/dL (ref 0.0–1.0)
pH: 5 (ref 5.0–8.0)

## 2014-04-28 LAB — URINE MICROSCOPIC-ADD ON

## 2014-04-28 LAB — POC URINE PREG, ED: PREG TEST UR: NEGATIVE

## 2014-04-28 MED ORDER — ONDANSETRON 4 MG PO TBDP
4.0000 mg | ORAL_TABLET | Freq: Once | ORAL | Status: AC
  Administered 2014-04-28: 4 mg via ORAL
  Filled 2014-04-28: qty 1

## 2014-04-28 MED ORDER — ONDANSETRON 4 MG PO TBDP: 4.0000 mg | ORAL_TABLET | Freq: Three times a day (TID) | ORAL | Status: DC | PRN

## 2014-04-28 NOTE — Discharge Instructions (Signed)

## 2014-04-28 NOTE — ED Provider Notes (Signed)
CSN: [id number]     Arrival date & time 04/28/14  2130 History   First MD Initiated Contact with Patient 04/28/14 [phone]     Chief Complaint  Patient presents with  . Abdominal Pain     (Consider location/radiation/quality/duration/timing/severity/associated sxs/prior Treatment) HPI  This a 23 year old female who presents with nausea and abdominal cramping. Onset of symptoms was this morning. Patient reports nausea without vomiting or diarrhea. She reports a crampy feeling in her upper stomach and states "my stomach feels bubbly." Denies any fevers. Reports her sick contact in an uncle who had gastroenteritis and "I wanted to get here before I got sick." Last menstrual period was one week ago. Denies any urinary symptoms.  No past medical history on file. No past surgical history on file. No family history on file. History  Substance Use Topics  . Smoking status: Current Every Day Smoker -- 0.50 packs/day    Types: Cigarettes  . Smokeless tobacco: Never Used  . Alcohol Use: Yes     Comment: occasionally   OB History   Grav Para Term Preterm Abortions TAB SAB Ect Mult Living                 Review of Systems  Constitutional: Negative for fever and chills.  Respiratory: Negative for chest tightness and shortness of breath.   Cardiovascular: Negative for chest pain.  Gastrointestinal: Positive for nausea. Negative for vomiting, abdominal pain, diarrhea and constipation.  Genitourinary: Negative for dysuria and vaginal discharge.  Neurological: Negative for headaches.  All other systems reviewed and are negative.     Allergies  Review of patient's allergies indicates no known allergies.  Home Medications   Prior to Admission medications   Medication Sig Start Date End Date Taking? Authorizing Provider  ondansetron (ZOFRAN-ODT) 4 MG disintegrating tablet Take 1 tablet (4 mg total) by mouth every 8 (eight) hours as needed for nausea or vomiting. 04/28/14   Shon Baton,  MD   BP 114/76  Pulse 73  Temp(Src) 98.7 F (37.1 C) (Oral)  Resp 16  SpO2 100%  LMP 04/22/2014 Physical Exam  Nursing note and vitals reviewed. Constitutional: She is oriented to person, place, and time. She appears well-developed and well-nourished. No distress.  thin  HENT:  Head: Normocephalic and atraumatic.  Mouth/Throat: Oropharynx is clear and moist.  Cardiovascular: Normal rate, regular rhythm and normal heart sounds.   No murmur heard. Pulmonary/Chest: Effort normal and breath sounds normal. No respiratory distress. She has no wheezes.  Abdominal: Soft. Bowel sounds are normal. There is no tenderness. There is no rebound.  Neurological: She is alert and oriented to person, place, and time.  Skin: Skin is warm and dry.  Psychiatric: She has a normal mood and affect.    ED Course  Procedures (including critical care time) Labs Review Labs Reviewed  URINALYSIS, ROUTINE W REFLEX MICROSCOPIC - Abnormal; Notable for the following:    APPearance CLOUDY (*)    Hgb urine dipstick SMALL (*)    All other components within normal limits  URINE MICROSCOPIC-ADD ON  POC URINE PREG, ED    Imaging Review No results found.   EKG Interpretation None      MDM   Final diagnoses:  Nausea   Patient presents with nausea and crampy abdominal pain. Is concerned she may be getting sick. Sick contacts of an uncle with gastroenteritis. She is nontender on exam. Patient was given Zofran ODT. Urinalysis obtained and urine pregnancy is negative. Discuss with patient  that it is difficult pertinent viral gastroenteritis. She should use supportive care at home. Will discharge home with Zofran ODT. At this time do not feel further lab work or imaging is necessary.  After history, exam, and medical workup I feel the patient has been appropriately medically screened and is safe for discharge home. Pertinent diagnoses were discussed with the patient. Patient was given return  precautions.      Shon Baton, MD 04/28/14 1145

## 2014-04-28 NOTE — ED Notes (Signed)
MD at bedside. 

## 2014-04-28 NOTE — ED Notes (Signed)
Pt states "My stomach feels bubbly." Denies N/V/D. "My uncle was sick, came to the hospital cause he was throwing up. I wanted to get here before I started getting too bad."

## 2014-07-30 ENCOUNTER — Emergency Department (HOSPITAL_COMMUNITY)
Admission: EM | Admit: 2014-07-30 | Discharge: 2014-07-30 | Disposition: A | Discharge status: Home / Self Care | Payer: Medicaid Other | Attending: Emergency Medicine | Admitting: Emergency Medicine

## 2014-07-30 ENCOUNTER — Encounter (HOSPITAL_COMMUNITY): Payer: Self-pay | Admitting: Emergency Medicine

## 2014-07-30 DIAGNOSIS — M25562 Pain in left knee: Secondary | ICD-10-CM

## 2014-07-30 DIAGNOSIS — S8002XA Contusion of left knee, initial encounter: Secondary | ICD-10-CM | POA: Diagnosis not present

## 2014-07-30 DIAGNOSIS — S8992XA Unspecified injury of left lower leg, initial encounter: Secondary | ICD-10-CM | POA: Insufficient documentation

## 2014-07-30 DIAGNOSIS — Y9389 Activity, other specified: Secondary | ICD-10-CM | POA: Insufficient documentation

## 2014-07-30 DIAGNOSIS — Z79899 Other long term (current) drug therapy: Secondary | ICD-10-CM | POA: Diagnosis not present

## 2014-07-30 DIAGNOSIS — Z720 Tobacco use: Secondary | ICD-10-CM | POA: Diagnosis not present

## 2014-07-30 DIAGNOSIS — Y9259 Other trade areas as the place of occurrence of the external cause: Secondary | ICD-10-CM | POA: Diagnosis not present

## 2014-07-30 DIAGNOSIS — Y990 Civilian activity done for income or pay: Secondary | ICD-10-CM | POA: Diagnosis not present

## 2014-07-30 DIAGNOSIS — W2209XA Striking against other stationary object, initial encounter: Secondary | ICD-10-CM | POA: Insufficient documentation

## 2014-07-30 MED ORDER — NAPROXEN 500 MG PO TABS: 500.0000 mg | ORAL_TABLET | Freq: Two times a day (BID) | ORAL | Status: DC

## 2014-07-30 MED ORDER — NAPROXEN 250 MG PO TABS
500.0000 mg | ORAL_TABLET | Freq: Once | ORAL | Status: AC
  Administered 2014-07-30: 500 mg via ORAL
  Filled 2014-07-30: qty 2

## 2014-07-30 NOTE — ED Notes (Signed)
Pt. Stated, I hit my knee on Thursday and I can't work, so I have to have a note.

## 2014-07-30 NOTE — ED Provider Notes (Signed)
CSN: [id number]     Arrival date & time 07/30/14  1407 History  This chart was scribed for non-physician practitioner working with Purvis SheffieldForrest Harrison, MD by Elveria Risingimelie Horne, ED Scribe. This patient was seen in room TR05C/TR05C and the patient's care was started at 3:21 PM.   Chief Complaint  Patient presents with  . Knee Pain   The history is provided by the patient. No language interpreter was used.   HPI Comments: [name] is a 23 y.o. female who presents to the Emergency Department with left knee injury incurred three days ago. 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 Patient requests note for work.    History reviewed. No pertinent past medical history. History reviewed. No pertinent past surgical history. No family history on file. History  Substance Use Topics  . Smoking status: Current Every Day Smoker -- 0.50 packs/day    Types: Cigarettes  . Smokeless tobacco: Never Used  . Alcohol Use: Yes     Comment: occasionally   OB History    No data available     Review of Systems  Constitutional: Negative for fever and chills.  Respiratory: Negative for shortness of breath.   Cardiovascular: Negative for leg swelling.  Musculoskeletal: Positive for arthralgias.  Skin: Negative for color change.    Allergies  Review of patient's allergies indicates no known allergies.  Home Medications   Prior to Admission medications   Medication Sig Start Date End Date Taking? Authorizing Provider  ondansetron (ZOFRAN-ODT) 4 MG disintegrating tablet Take 1 tablet (4 mg total) by mouth every 8 (eight) hours as needed for nausea or vomiting. 04/28/14   Shon Batonourtney F Horton, MD   Triage Vitals: BP 104/67 mmHg  Pulse 92  Temp(Src) 97.6 F (36.4 C)  Resp 16  Ht 5\' 3"  (1.6 m)  Wt 99 lb (44.906 kg)  BMI 17.54 kg/m2  SpO2 97%  LMP 07/23/2014 Physical Exam  Constitutional: She is oriented to person, place, and time. She appears  well-developed and well-nourished. No distress.  HENT:  Head: Normocephalic and atraumatic.  Eyes: EOM are normal.  Neck: Neck supple. No tracheal deviation present.  Cardiovascular: Normal rate.   Pulmonary/Chest: Effort normal. No respiratory distress.  Musculoskeletal: Normal range of motion.  No patellar laxity noted. Some soft tissue swelling.  Small area of bruising to lateral patella. 5/5 plantar and dorsiflexion. 5/5 straight leg raise.  Neurological: She is alert and oriented to person, place, and time.  Skin: Skin is warm and dry.  Psychiatric: She has a normal mood and affect. Her behavior is normal.  Nursing note and vitals reviewed.   ED Course  Procedures (including critical care time)  COORDINATION OF CARE: 3:25 PM- Discussed treatment plan with patient at bedside and patient agreed to plan.   Labs Review Labs Reviewed - No data to display  Imaging Review No results found.   EKG Interpretation None      MDM   Final diagnoses:  Left knee pain   23 yo with report of mild trauma to knee, she declines xray, reports she just needs a note for work.   NSAID provided for pain. Pt advised to follow up with orthopedics if symptoms persist. Conservative therapy recommended and discussed. Note provided for pt to return to work tomorrow. Patient will be dc home & is agreeable with above plan.   I personally performed the services described in this documentation, which was scribed in  my presence. The recorded information has been reviewed and is accurate.  Filed Vitals:   07/30/14 1415  BP: 104/67  Pulse: 92  Temp: 97.6 F (36.4 C)  Resp: 16  Height: 5\' 3"  (1.6 m)  Weight: 99 lb (44.906 kg)  SpO2: 97%   Meds given in ED:  Medications  naproxen (NAPROSYN) tablet 500 mg (500 mg Oral Given 07/30/14 1545)    Discharge Medication List as of 07/30/2014  3:37 PM    START taking these medications   Details  naproxen (NAPROSYN) 500 MG tablet Take 1 tablet (500  mg total) by mouth 2 (two) times daily., Starting 07/30/2014, Until Discontinued, Print         Harle BattiestElizabeth Cyara Devoto, NP 08/01/14 [id number]  Purvis SheffieldForrest Harrison, MD 08/02/14 [phone]

## 2014-07-30 NOTE — Discharge Instructions (Signed)
Please follow the directions provided. He may use the naproxen twice a day for pain or inflammation. If your symptoms persist she can follow-up with the referral given.  Don't hesitate to return for any new, worsening, or concerning symptoms.  SEEK IMMEDIATE MEDICAL CARE IF:  Your knee joint feels hot to the touch and you have a high fever.

## 2014-07-30 NOTE — ED Notes (Signed)
Declined W/C at D/C and was escorted to lobby by RN. 

## 2014-08-23 ENCOUNTER — Encounter (HOSPITAL_COMMUNITY): Payer: Self-pay | Admitting: Adult Health

## 2014-08-23 ENCOUNTER — Emergency Department (HOSPITAL_COMMUNITY)
Admission: EM | Admit: 2014-08-23 | Discharge: 2014-08-23 | Disposition: A | Discharge status: Home / Self Care | Payer: Medicaid Other | Attending: Emergency Medicine | Admitting: Emergency Medicine

## 2014-08-23 DIAGNOSIS — Z791 Long term (current) use of non-steroidal anti-inflammatories (NSAID): Secondary | ICD-10-CM | POA: Diagnosis not present

## 2014-08-23 DIAGNOSIS — Z720 Tobacco use: Secondary | ICD-10-CM | POA: Insufficient documentation

## 2014-08-23 DIAGNOSIS — Z3202 Encounter for pregnancy test, result negative: Secondary | ICD-10-CM | POA: Diagnosis not present

## 2014-08-23 DIAGNOSIS — N898 Other specified noninflammatory disorders of vagina: Secondary | ICD-10-CM

## 2014-08-23 LAB — URINE MICROSCOPIC-ADD ON

## 2014-08-23 LAB — URINALYSIS, ROUTINE W REFLEX MICROSCOPIC
Bilirubin Urine: NEGATIVE
Glucose, UA: NEGATIVE mg/dL
Ketones, ur: NEGATIVE mg/dL
Nitrite: NEGATIVE
PROTEIN: NEGATIVE mg/dL
Specific Gravity, Urine: 1.012 (ref 1.005–1.030)
UROBILINOGEN UA: 0.2 mg/dL (ref 0.0–1.0)
pH: 6 (ref 5.0–8.0)

## 2014-08-23 LAB — WET PREP, GENITAL
Clue Cells Wet Prep HPF POC: NONE SEEN
Trich, Wet Prep: NONE SEEN
Yeast Wet Prep HPF POC: NONE SEEN

## 2014-08-23 LAB — PREGNANCY, URINE: Preg Test, Ur: NEGATIVE

## 2014-08-23 MED ORDER — FLUCONAZOLE 150 MG PO TABS: 150.0000 mg | ORAL_TABLET | Freq: Once | ORAL | Status: AC

## 2014-08-23 MED ORDER — CEFTRIAXONE SODIUM 250 MG IJ SOLR
250.0000 mg | Freq: Once | INTRAMUSCULAR | Status: DC
  Filled 2014-08-23: qty 250

## 2014-08-23 MED ORDER — LIDOCAINE HCL (PF) 1 % IJ SOLN
2.0000 mL | Freq: Once | INTRAMUSCULAR | Status: DC
  Filled 2014-08-23: qty 5

## 2014-08-23 MED ORDER — AZITHROMYCIN 1 G PO PACK
1.0000 g | PACK | Freq: Once | ORAL | Status: DC
  Filled 2014-08-23: qty 1

## 2014-08-23 NOTE — ED Provider Notes (Signed)
CSN: [id number]     Arrival date & time 08/23/14  1752 History   First MD Initiated Contact with Patient 08/23/14 1829     Chief Complaint  Patient presents with  . Vaginal Discharge     The history is provided by the patient. No language interpreter was used.   Ms. Marcia Jackson presents for complaint of vaginal discomfort and discharge.  Sxs started about three days ago.  There is some discharge.  No abdominal pain. No dysuria.  No diarrehea, constipation, fevers, vomiting.  Last cycle one month ago.  No new sexual partners.  No medical problems.  Symptoms are mild, intermittent, worsening.  History reviewed. No pertinent past medical history. History reviewed. No pertinent past surgical history. History reviewed. No pertinent family history. History  Substance Use Topics  . Smoking status: Current Every Day Smoker -- 0.50 packs/day    Types: Cigarettes  . Smokeless tobacco: Never Used  . Alcohol Use: Yes     Comment: occasionally   OB History    No data available     Review of Systems  All other systems reviewed and are negative.     Allergies  Review of patient's allergies indicates no known allergies.  Home Medications   Prior to Admission medications   Medication Sig Start Date End Date Taking? Authorizing Provider  naproxen (NAPROSYN) 500 MG tablet Take 1 tablet (500 mg total) by mouth 2 (two) times daily. 07/30/14   Harle Battiest, NP  ondansetron (ZOFRAN-ODT) 4 MG disintegrating tablet Take 1 tablet (4 mg total) by mouth every 8 (eight) hours as needed for nausea or vomiting. 04/28/14   Shon Baton, MD   BP 117/84 mmHg  Pulse 69  Temp(Src) 97.6 F (36.4 C) (Oral)  Resp 16  SpO2 100%  LMP 07/23/2014 (Approximate) Physical Exam  Constitutional: She is oriented to person, place, and time. She appears well-developed and well-nourished.  HENT:  Head: Normocephalic and atraumatic.  Cardiovascular: Normal rate and regular rhythm.   No murmur  heard. Pulmonary/Chest: Effort normal and breath sounds normal. No respiratory distress.  Abdominal: Soft. There is no tenderness. There is no rebound and no guarding.  Genitourinary:  Small amount of white vaginal discharge, no CMT or adnexal tenderness  Musculoskeletal: She exhibits no edema or tenderness.  Neurological: She is alert and oriented to person, place, and time.  Skin: Skin is warm and dry.  Psychiatric: She has a normal mood and affect. Her behavior is normal.  Nursing note and vitals reviewed.   ED Course  Procedures (including critical care time) Labs Review Labs Reviewed  WET PREP, GENITAL - Abnormal; Notable for the following:    WBC, Wet Prep HPF POC MODERATE (*)    All other components within normal limits  URINALYSIS, ROUTINE W REFLEX MICROSCOPIC - Abnormal; Notable for the following:    Hgb urine dipstick SMALL (*)    Leukocytes, UA LARGE (*)    All other components within normal limits  URINE MICROSCOPIC-ADD ON - Abnormal; Notable for the following:    Squamous Epithelial / LPF FEW (*)    Bacteria, UA FEW (*)    All other components within normal limits  GC/CHLAMYDIA PROBE AMP  PREGNANCY, URINE    Imaging Review No results found.   EKG Interpretation None      MDM   Final diagnoses:  Vaginal discharge    Patient here for evaluation of vaginal discharge. Exam is not consistent with PID or tubo-ovarian abscess. Offered patient appeared  treatment for possible cervicitis and patient refused. UA not consistent with UTI. Treatment for possible yeast infection with one-time dose of Diflucan. Discussed with pt health department followup.      Tilden FossaElizabeth Kahlani Graber, MD 08/24/14 Moses [id number]

## 2014-08-23 NOTE — Discharge Instructions (Signed)

## 2014-08-23 NOTE — ED Notes (Signed)
Presents with vaginal discharge that began Sunday associated with itching and vaginal pain, she is unsure when her last period was and would like a pregnancy as well.

## 2014-08-25 LAB — GC/CHLAMYDIA PROBE AMP
CT PROBE, AMP APTIMA: POSITIVE — AB
GC Probe RNA: NEGATIVE

## 2014-08-26 ENCOUNTER — Telehealth (HOSPITAL_COMMUNITY): Payer: Self-pay

## 2014-08-26 NOTE — ED Notes (Signed)
Positive for Chlamydia.  Chart sent to EDP office for review 

## 2014-08-28 ENCOUNTER — Telehealth (HOSPITAL_BASED_OUTPATIENT_CLINIC_OR_DEPARTMENT_OTHER): Payer: Self-pay | Admitting: Emergency Medicine

## 2014-08-30 ENCOUNTER — Telehealth (HOSPITAL_BASED_OUTPATIENT_CLINIC_OR_DEPARTMENT_OTHER): Payer: Self-pay | Admitting: Emergency Medicine

## 2014-08-30 NOTE — Telephone Encounter (Signed)
Patient returned call , informed + chlamydia, educated re abstinence and notification of partners, [facility] [place] [facility] [phone]

## 2014-08-31 ENCOUNTER — Emergency Department (HOSPITAL_COMMUNITY)
Admission: EM | Admit: 2014-08-31 | Discharge: 2014-08-31 | Disposition: A | Discharge status: Home / Self Care | Payer: Medicaid Other | Attending: Emergency Medicine | Admitting: Emergency Medicine

## 2014-08-31 ENCOUNTER — Encounter (HOSPITAL_COMMUNITY): Payer: Self-pay | Admitting: *Deleted

## 2014-08-31 DIAGNOSIS — Z720 Tobacco use: Secondary | ICD-10-CM | POA: Diagnosis not present

## 2014-08-31 DIAGNOSIS — Z791 Long term (current) use of non-steroidal anti-inflammatories (NSAID): Secondary | ICD-10-CM | POA: Insufficient documentation

## 2014-08-31 DIAGNOSIS — A749 Chlamydial infection, unspecified: Secondary | ICD-10-CM | POA: Insufficient documentation

## 2014-08-31 DIAGNOSIS — Z202 Contact with and (suspected) exposure to infections with a predominantly sexual mode of transmission: Secondary | ICD-10-CM | POA: Diagnosis present

## 2014-08-31 DIAGNOSIS — Z3202 Encounter for pregnancy test, result negative: Secondary | ICD-10-CM | POA: Insufficient documentation

## 2014-08-31 DIAGNOSIS — J069 Acute upper respiratory infection, unspecified: Secondary | ICD-10-CM | POA: Diagnosis not present

## 2014-08-31 LAB — POC URINE PREG, ED: Preg Test, Ur: NEGATIVE

## 2014-08-31 MED ORDER — LIDOCAINE HCL (PF) 1 % IJ SOLN
1.0000 mL | Freq: Once | INTRAMUSCULAR | Status: AC
  Administered 2014-08-31: 1 mL

## 2014-08-31 MED ORDER — CEFTRIAXONE SODIUM 250 MG IJ SOLR
250.0000 mg | Freq: Once | INTRAMUSCULAR | Status: AC
  Administered 2014-08-31: 250 mg via INTRAMUSCULAR
  Filled 2014-08-31: qty 250

## 2014-08-31 MED ORDER — LIDOCAINE HCL (PF) 1 % IJ SOLN
INTRAMUSCULAR | Status: AC
  Administered 2014-08-31: 1 mL
  Filled 2014-08-31: qty 5

## 2014-08-31 MED ORDER — AZITHROMYCIN 250 MG PO TABS
1000.0000 mg | ORAL_TABLET | Freq: Once | ORAL | Status: AC
  Administered 2014-08-31: 1000 mg via ORAL
  Filled 2014-08-31: qty 4

## 2014-08-31 NOTE — Discharge Instructions (Signed)
Return to the emergency room with worsening of symptoms, new symptoms or with symptoms that are concerning. Do have intercourse until your partner has been treated.   Chlamydia Chlamydia is an infection. It is spread through sexual contact. Chlamydia can be in different areas of the body. These areas include the cervix, urethra, throat, or rectum. You may not know you have chlamydia because many people never develop the symptoms. Chlamydia is not difficult to treat once you know you have it. However, if it is left untreated, chlamydia can lead to more serious health problems.  CAUSES  Chlamydia is caused by bacteria. It is a sexually transmitted disease. It is passed from an infected partner during intimate contact. This contact could be with the genitals, mouth, or rectal area. Chlamydia can also be passed from mothers to babies during birth. SIGNS AND SYMPTOMS  There may not be any symptoms. This is often the case early in the infection. If symptoms develop, they may include:  Mild pain and discomfort when urinating.  Redness, soreness, and swelling (inflammation) of the rectum.  Vaginal discharge.  Painful intercourse.  Abdominal pain.  Bleeding between menstrual periods. DIAGNOSIS  To diagnose this infection, your health care provider will do a pelvic exam. Cultures will be taken of the vagina, cervix, urine, and possibly the rectum to verify the diagnosis.  TREATMENT You will be given antibiotic medicines. If you are pregnant, certain types of antibiotics will need to be avoided. Any sexual partners should also be treated, even if they do not show symptoms.  HOME CARE INSTRUCTIONS   Take your antibiotic medicine as directed by your health care provider. Finish the antibiotic even if you start to feel better.  Take medicines only as directed by your health care provider.  Inform any sexual partners about the infection. They should also be treated.  Do not have sexual contact  until your health care provider tells you it is okay.  Get plenty of rest.  Eat a well-balanced diet.  Drink enough fluids to keep your urine clear or pale yellow.  Keep all follow-up visits as directed by your health care provider. SEEK MEDICAL CARE IF:  You have painful urination.  You have abdominal pain.  You have vaginal discharge.  You have painful sexual intercourse.  You have bleeding between periods and after sex.  You have a fever. SEEK IMMEDIATE MEDICAL CARE IF:   You experience nausea or vomiting.  You experience excessive sweating (diaphoresis).  You have difficulty swallowing. MAKE SURE YOU:   Understand these instructions.  Will watch your condition.  Will get help right away if you are not doing well or get worse. Document Released: 05/08/2005 Document Revised: 12/13/2013 Document Reviewed: 04/05/2013 [facility] Patient Information 2015 [place], [place]. This information is not intended to replace advice given to you by your health care provider. Make sure you discuss any questions you have with your health care provider.  Safe Sex Safe sex is about reducing the risk of giving or getting a sexually transmitted disease (STD). STDs are spread through sexual contact involving the genitals, mouth, or rectum. Some STDs can be cured and others cannot. Safe sex can also prevent unintended pregnancies.  WHAT ARE SOME SAFE SEX PRACTICES?  Limit your sexual activity to only one partner who is having sex with only you.  Talk to your partner about his or her past partners, past STDs, and drug use.  Use a condom every time you have sexual intercourse. This includes vaginal, oral,  and anal sexual activity. Both females and males should wear condoms during oral sex. Only use latex or polyurethane condoms and water-based lubricants. Using petroleum-based lubricants or oils to lubricate a condom will weaken the condom and increase the chance that it will break. The condom  should be in place from the beginning to the end of sexual activity. Wearing a condom reduces, but does not completely eliminate, your risk of getting or giving an STD. STDs can be spread by contact with infected body fluids and skin.  Get vaccinated for hepatitis B and HPV.  Avoid alcohol and recreational drugs, which can affect your judgment. You may forget to use a condom or participate in high-risk sex.  For females, avoid douching after sexual intercourse. Douching can spread an infection farther into the reproductive tract.  Check your body for signs of sores, blisters, rashes, or unusual discharge. See your health care provider if you notice any of these signs.  Avoid sexual contact if you have symptoms of an infection or are being treated for an STD. If you or your partner has herpes, avoid sexual contact when blisters are present. Use condoms at all other times.  If you are at risk of being infected with HIV, it is recommended that you take a prescription medicine daily to prevent HIV infection. This is called pre-exposure prophylaxis (PrEP). You are considered at risk if:  You are a man who has sex with other men (MSM).  You are a heterosexual man or woman who is sexually active with more than one partner.  You take drugs by injection.  You are sexually active with a partner who has HIV.  Talk with your health care provider about whether you are at high risk of being infected with HIV. If you choose to begin PrEP, you should first be tested for HIV. You should then be tested every 3 months for as long as you are taking PrEP.  See your health care provider for regular screenings, exams, and tests for other STDs. Before having sex with a new partner, each of you should be screened for STDs and should talk about the results with each other. WHAT ARE THE BENEFITS OF SAFE SEX?   There is less chance of getting or giving an STD.  You can prevent unwanted or unintended  pregnancies.  By discussing safe sex concerns with your partner, you may increase feelings of intimacy, comfort, trust, and honesty between the two of you. Document Released: 09/05/2004 Document Revised: 12/13/2013 Document Reviewed: 01/20/2012 [facility] Patient Information 2015 [place]. This information is not intended to replace advice given to you by your health care provider. Make sure you discuss any questions you have with your health care provider.

## 2014-08-31 NOTE — ED Provider Notes (Signed)
CSN: [id number]     Arrival date & time 08/31/14  1345 History   First MD Initiated Contact with Patient 08/31/14 1422     Chief Complaint  Patient presents with  . SEXUALLY TRANSMITTED DISEASE  . URI     (Consider location/radiation/quality/duration/timing/severity/associated sxs/prior Treatment) HPI  Marcia Jackson is a 24 y.o. female presenting with positive Chlamydia cultures. She had pelvic 7 days ago. She refused antibiotics for cervicitis at that time. She denies any vaginal discomfort discharge at this time. She denies any urinary symptoms or abdominal pain. No pelvic pain. No vaginal bleeding, fevers or chills or back pain. No changes in her stools. No new sexual partners.  Patient also with 2-3 day history of a cold she denies fevers and endorses rhinorrhea, sore throat, generalized body aches. She denies any cough. She has not taken anything for the symptoms.   History reviewed. No pertinent past medical history. History reviewed. No pertinent past surgical history. History reviewed. No pertinent family history. History  Substance Use Topics  . Smoking status: Current Every Day Smoker -- 0.50 packs/day    Types: Cigarettes  . Smokeless tobacco: Never Used  . Alcohol Use: Yes     Comment: occasionally   OB History    No data available     Review of Systems  Constitutional: Negative for fever and chills.  HENT: Positive for congestion, rhinorrhea and sore throat.   Gastrointestinal: Negative for nausea and vomiting.  Genitourinary: Negative for dysuria, urgency, flank pain, vaginal bleeding and vaginal pain.  Musculoskeletal: Negative for back pain and gait problem.      Allergies  Review of patient's allergies indicates no known allergies.  Home Medications   Prior to Admission medications   Medication Sig Start Date End Date Taking? Authorizing Provider  Naproxen Sodium 220 MG CAPS Take 220 mg by mouth daily as needed (pain).   Yes Historical Provider, MD   naproxen (NAPROSYN) 500 MG tablet Take 1 tablet (500 mg total) by mouth 2 (two) times daily. Patient not taking: Reported on 08/31/2014 07/30/14   Harle Battiest, NP  ondansetron (ZOFRAN-ODT) 4 MG disintegrating tablet Take 1 tablet (4 mg total) by mouth every 8 (eight) hours as needed for nausea or vomiting. Patient not taking: Reported on 08/31/2014 04/28/14   Shon Baton, MD   BP 104/62 mmHg  Pulse 78  Temp(Src) 98.1 F (36.7 C) (Oral)  Resp 16  SpO2 99%  LMP 07/23/2014 (Approximate) Physical Exam  Constitutional: She appears well-developed and well-nourished. No distress.  HENT:  Head: Normocephalic and atraumatic.  Nose: Right sinus exhibits no maxillary sinus tenderness and no frontal sinus tenderness. Left sinus exhibits no maxillary sinus tenderness and no frontal sinus tenderness.  Mouth/Throat: Mucous membranes are normal. Posterior oropharyngeal erythema present. No oropharyngeal exudate or posterior oropharyngeal edema.  Eyes: Conjunctivae and EOM are normal. Right eye exhibits no discharge. Left eye exhibits no discharge.  Neck: Normal range of motion. Neck supple.  Cardiovascular: Normal rate, regular rhythm and normal heart sounds.   Pulmonary/Chest: Effort normal and breath sounds normal. No respiratory distress. She has no wheezes. She has no rales.  Abdominal: Soft. Bowel sounds are normal. She exhibits no distension. There is no tenderness.  Lymphadenopathy:    She has cervical adenopathy.  Neurological: She is alert.  Skin: Skin is warm and dry. She is not diaphoretic.  Nursing note and vitals reviewed.   ED Course  Procedures (including critical care time) Labs Review Labs Reviewed  POC URINE PREG, ED    Imaging Review No results found.   EKG Interpretation None      MDM   Final diagnoses:  URI (upper respiratory infection)  Chlamydia   Patient presenting with positive Chlamydia culture. Gonorrhea test negative. She denies any new  symptoms. Benign Abdominal exam. We'll treat with azithromycin and ceftriaxone. Discussed having her partner treated. Chest the importance of safe sex. Patient refusing HIV and was testing. Patient also with symptoms of URI no cough. No indication for chest x-ray. Discussed supportive treatment. Patient is hemodynamically stable in ED and in no acute distress prior to discharge.   Discussed return precautions with patient. Discussed all results and patient verbalizes understanding and agrees with plan.   Louann SjogrenVictoria L Arisa Congleton, PA-C 08/31/14 1546  Lyanne CoKevin M Campos, MD 08/31/14 [phone]

## 2014-08-31 NOTE — ED Notes (Signed)
Pt reports being seen here recently. Was called on 1/15 and informed she was +for std and needs to be treated. Also has cold symptoms and headache.

## 2014-11-12 ENCOUNTER — Encounter (HOSPITAL_COMMUNITY): Payer: Self-pay | Admitting: *Deleted

## 2014-11-12 ENCOUNTER — Emergency Department (HOSPITAL_COMMUNITY)
Admission: EM | Admit: 2014-11-12 | Discharge: 2014-11-12 | Disposition: A | Discharge status: Home / Self Care | Payer: Medicaid Other | Attending: Emergency Medicine | Admitting: Emergency Medicine

## 2014-11-12 DIAGNOSIS — R112 Nausea with vomiting, unspecified: Secondary | ICD-10-CM

## 2014-11-12 DIAGNOSIS — R109 Unspecified abdominal pain: Secondary | ICD-10-CM | POA: Insufficient documentation

## 2014-11-12 DIAGNOSIS — Z720 Tobacco use: Secondary | ICD-10-CM | POA: Diagnosis not present

## 2014-11-12 DIAGNOSIS — Z79899 Other long term (current) drug therapy: Secondary | ICD-10-CM | POA: Insufficient documentation

## 2014-11-12 DIAGNOSIS — Z3202 Encounter for pregnancy test, result negative: Secondary | ICD-10-CM | POA: Insufficient documentation

## 2014-11-12 DIAGNOSIS — R197 Diarrhea, unspecified: Secondary | ICD-10-CM | POA: Diagnosis not present

## 2014-11-12 DIAGNOSIS — Z791 Long term (current) use of non-steroidal anti-inflammatories (NSAID): Secondary | ICD-10-CM | POA: Diagnosis not present

## 2014-11-12 LAB — URINALYSIS, ROUTINE W REFLEX MICROSCOPIC
GLUCOSE, UA: NEGATIVE mg/dL
Ketones, ur: 40 mg/dL — AB
NITRITE: NEGATIVE
PROTEIN: NEGATIVE mg/dL
SPECIFIC GRAVITY, URINE: 1.03 (ref 1.005–1.030)
Urobilinogen, UA: 1 mg/dL (ref 0.0–1.0)
pH: 5.5 (ref 5.0–8.0)

## 2014-11-12 LAB — CBC WITH DIFFERENTIAL/PLATELET
BASOS ABS: 0 10*3/uL (ref 0.0–0.1)
Basophils Relative: 0 % (ref 0–1)
EOS ABS: 0 10*3/uL (ref 0.0–0.7)
Eosinophils Relative: 0 % (ref 0–5)
HCT: 41.7 % (ref 36.0–46.0)
Hemoglobin: 14.1 g/dL (ref 12.0–15.0)
Lymphocytes Relative: 20 % (ref 12–46)
Lymphs Abs: 1.4 10*3/uL (ref 0.7–4.0)
MCH: 29.1 pg (ref 26.0–34.0)
MCHC: 33.8 g/dL (ref 30.0–36.0)
MCV: 86.2 fL (ref 78.0–100.0)
MONOS PCT: 9 % (ref 3–12)
Monocytes Absolute: 0.6 10*3/uL (ref 0.1–1.0)
NEUTROS PCT: 71 % (ref 43–77)
Neutro Abs: 4.9 10*3/uL (ref 1.7–7.7)
PLATELETS: 177 10*3/uL (ref 150–400)
RBC: 4.84 MIL/uL (ref 3.87–5.11)
RDW: 12.2 % (ref 11.5–15.5)
WBC: 6.9 10*3/uL (ref 4.0–10.5)

## 2014-11-12 LAB — LIPASE, BLOOD: LIPASE: 33 U/L (ref 11–59)

## 2014-11-12 LAB — COMPREHENSIVE METABOLIC PANEL
ALT: 15 U/L (ref 0–35)
ANION GAP: 12 (ref 5–15)
AST: 21 U/L (ref 0–37)
Albumin: 4.1 g/dL (ref 3.5–5.2)
Alkaline Phosphatase: 73 U/L (ref 39–117)
BUN: 18 mg/dL (ref 6–23)
CO2: 22 mmol/L (ref 19–32)
CREATININE: 0.89 mg/dL (ref 0.50–1.10)
Calcium: 9.4 mg/dL (ref 8.4–10.5)
Chloride: 102 mmol/L (ref 96–112)
GFR calc Af Amer: >90 mL/min (ref >90)
Glucose, Bld: 86 mg/dL (ref 70–99)
Potassium: 3.5 mmol/L (ref 3.5–5.1)
SODIUM: 136 mmol/L (ref 135–145)
Total Bilirubin: 1 mg/dL (ref 0.3–1.2)
Total Protein: 6.6 g/dL (ref 6.0–8.3)

## 2014-11-12 LAB — URINE MICROSCOPIC-ADD ON

## 2014-11-12 LAB — POC URINE PREG, ED: Preg Test, Ur: NEGATIVE

## 2014-11-12 MED ORDER — SODIUM CHLORIDE 0.9 % IV BOLUS (SEPSIS): 1000.0000 mL | Freq: Once | INTRAVENOUS | Status: DC

## 2014-11-12 MED ORDER — ONDANSETRON 4 MG PO TBDP
4.0000 mg | ORAL_TABLET | Freq: Once | ORAL | Status: AC
  Administered 2014-11-12: 4 mg via ORAL
  Filled 2014-11-12: qty 1

## 2014-11-12 MED ORDER — KETOROLAC TROMETHAMINE 30 MG/ML IJ SOLN: 30.0000 mg | Freq: Once | INTRAMUSCULAR | Status: DC

## 2014-11-12 NOTE — ED Provider Notes (Signed)
CSN: [id number]     Arrival date & time 11/12/14  1034 History   First MD Initiated Contact with Patient 11/12/14 1058     Chief Complaint  Patient presents with  . Nausea  . Emesis  . Abdominal Pain     (Consider location/radiation/quality/duration/timing/severity/associated sxs/prior Treatment) Patient is a 24 y.o. female presenting with vomiting and abdominal pain. The history is provided by the patient. No language interpreter was used.  Emesis Associated symptoms: abdominal pain   Abdominal Pain Associated symptoms: vomiting   Ms. Ophelia Charter is a 24 y.o white female who presents for gradual onset intermittent abdominal pain, nausea, vomiting, and diarrhea that began 2 days ago.  She is in no pain now but states she has not been able to eat much because of the nausea.  Nothing makes it better or worse. She denies NSAID or alcohol use. She denies any fever, chills, hematemesis, hematuria, dysuria, urinary frequency or hematochezia.  She is currently menstruating.  She says her periods are not regular since she has been on depo provera.    History reviewed. No pertinent past medical history. History reviewed. No pertinent past surgical history. No family history on file. History  Substance Use Topics  . Smoking status: Current Every Day Smoker -- 0.50 packs/day    Types: Cigarettes  . Smokeless tobacco: Never Used  . Alcohol Use: Yes     Comment: occasionally   OB History    No data available     Review of Systems  Gastrointestinal: Positive for vomiting and abdominal pain.  All other systems reviewed and are negative.     Allergies  Review of patient's allergies indicates no known allergies.  Home Medications   Prior to Admission medications   Medication Sig Start Date End Date Taking? Authorizing Provider  naproxen (NAPROSYN) 500 MG tablet Take 1 tablet (500 mg total) by mouth 2 (two) times daily. 07/30/14  Yes Harle Battiest, NP  Naproxen Sodium 220 MG CAPS Take  220 mg by mouth daily as needed (pain).   Yes Historical Provider, MD  ondansetron (ZOFRAN-ODT) 4 MG disintegrating tablet Take 1 tablet (4 mg total) by mouth every 8 (eight) hours as needed for nausea or vomiting. Patient not taking: Reported on 08/31/2014 04/28/14   Shon Baton, MD   BP 104/89 mmHg  Pulse 83  Temp(Src) 98 F (36.7 C) (Oral)  Resp 18  Ht  (1.626 m)  Wt 96 lb (43.545 kg)  BMI 16.47 kg/m2  SpO2 98%  LMP 11/11/2014 Physical Exam  Constitutional: She is oriented to person, place, and time. She appears well-developed and well-nourished.  HENT:  Head: Normocephalic and atraumatic.  Mouth/Throat: Oropharynx is clear and moist.  Eyes: Conjunctivae and EOM are normal.  Cardiovascular: Normal rate, regular rhythm and normal heart sounds.   Pulmonary/Chest: Effort normal and breath sounds normal.  Abdominal: Soft. Normal appearance. There is no tenderness. There is no guarding, no CVA tenderness, no tenderness at McBurney's point and negative Murphy's sign.  Musculoskeletal: Normal range of motion.  Neurological: She is alert and oriented to person, place, and time.  Skin: Skin is warm and dry.  Nursing note and vitals reviewed.   ED Course  Procedures (including critical care time) Labs Review Labs Reviewed  URINALYSIS, ROUTINE W REFLEX MICROSCOPIC - Abnormal; Notable for the following:    Color, Urine AMBER (*)    APPearance HAZY (*)    Hgb urine dipstick MODERATE (*)    Bilirubin Urine MODERATE (*)  Ketones, ur 40 (*)    Leukocytes, UA SMALL (*)    All other components within normal limits  URINE MICROSCOPIC-ADD ON - Abnormal; Notable for the following:    Squamous Epithelial / LPF FEW (*)    Bacteria, UA FEW (*)    All other components within normal limits  CBC WITH DIFFERENTIAL/PLATELET  COMPREHENSIVE METABOLIC PANEL  LIPASE, BLOOD  POC URINE PREG, ED    Imaging Review No results found.   EKG Interpretation None      MDM   Final  diagnoses:  Nausea vomiting and diarrhea  Patient presents for nausea/vomiting/diarrhea for 2 days. She denies any alcohol or nsaid use.  Her exam is normal.   I think this is viral.  Her labs are unremarkable.  Vitas are stable and she is afebrile. No abdominal pain in ED. No urinary symptoms.  No UTI.  She is currently on her menstrual cycle. The patient is sitting comfortably on the phone. I have given her the resource guide for f/u and a work note.  She is to stay well hydrated.     Catha GosselinHanna Patel-Mills, PA-C 11/12/14 1559  Blake DivineJohn Wofford, MD 11/13/14 [phone]

## 2014-11-12 NOTE — ED Notes (Signed)
MD at bedside. 

## 2014-11-12 NOTE — ED Notes (Signed)
Pt c/o N/V for a few days, states she also has abdominal pain that comes and goes.  Pt denies fever.  Also reports diarrhea x 1 day.  Pt a x 4, NAD.

## 2014-11-12 NOTE — Discharge Instructions (Signed)
Nausea and Vomiting °Nausea means you feel sick to your stomach. Throwing up (vomiting) is a reflex where stomach contents come out of your mouth. °HOME CARE  °· Take medicine as told by your doctor. °· Do not force yourself to eat. However, you do need to drink fluids. °· If you feel like eating, eat a normal diet as told by your doctor. °¨ Eat rice, wheat, potatoes, bread, lean meats, yogurt, fruits, and vegetables. °¨ Avoid high-fat foods. °· Drink enough fluids to keep your pee (urine) clear or pale yellow. °· Ask your doctor how to replace body fluid losses (rehydrate). Signs of body fluid loss (dehydration) include: °¨ Feeling very thirsty. °¨ Dry lips and mouth. °¨ Feeling dizzy. °¨ Dark pee. °¨ Peeing less than normal. °¨ Feeling confused. °¨ Fast breathing or heart rate. °GET HELP RIGHT AWAY IF:  °· You have blood in your throw up. °· You have black or bloody poop (stool). °· You have a bad headache or stiff neck. °· You feel confused. °· You have bad belly (abdominal) pain. °· You have chest pain or trouble breathing. °· You do not pee at least once every 8 hours. °· You have cold, clammy skin. °· You keep throwing up after 24 to 48 hours. °· You have a fever. °MAKE SURE YOU:  °· Understand these instructions. °· Will watch your condition. °· Will get help right away if you are not doing well or get worse. °Document Released: 01/15/2008 Document Revised: 10/21/2011 Document Reviewed: 12/28/2010 °ExitCare® Patient Information ©2015 [employer]. This information is not intended to replace advice given to you by your health care provider. Make sure you discuss any questions you have with your health care provider. ° ° °Emergency Department Resource Guide °1) Find a Doctor and Pay Out of Pocket °Although you won't have to find out who is covered by your insurance plan, it is a good idea to ask around and get recommendations. You will then need to call the office and see if the doctor you have chosen will  accept you as a new patient and what types of options they offer for patients who are self-pay. Some doctors offer discounts or will set up payment plans for their patients who do not have insurance, but you will need to ask so you aren't surprised when you get to your appointment. ° °2) Contact Your Local Health Department °Not all health departments have doctors that can see patients for sick visits, but many do, so it is worth a call to see if yours does. If you don't know where your local health department is, you can check in your phone book. The CDC also has a tool to help you locate your state's health department, and many state websites also have listings of all of their local health departments. ° °3) Find a Walk-in Clinic °If your illness is not likely to be very severe or complicated, you may want to try a walk in clinic. These are popping up all over the country in pharmacies, drugstores, and shopping centers. They're usually staffed by nurse practitioners or physician assistants that have been trained to treat common illnesses and complaints. They're usually fairly quick and inexpensive. However, if you have serious medical issues or chronic medical problems, these are probably not your best option. ° °No Primary Care Doctor: °- Call [facility] at  832-8000 - they can help you locate a primary care doctor that  accepts your insurance, provides certain services, etc. °-   Physician Referral Service- 1-800-533-3463 ° °Chronic Pain Problems: °Organization         Address  Phone   Notes  °[facility]  (336) 297-2271 Patients need to be referred by their primary care doctor.  ° °Medication Assistance: °Organization         Address  Phone   Notes  °[place] [facility] [address] °[place], [place] [address] (336) 641-8030 --Must be a resident of [place] °-- Must have NO insurance coverage whatsoever (no Medicaid/ Medicare, etc.) °-- The pt.  MUST have a primary care doctor that directs their care regularly and follows them in the community °  °[facility]  (866) 331-1348   °[employer]  (888) 892-1162   ° °Agencies that provide inexpensive medical care: °Organization         Address  Phone   Notes  °[facility]  (336) 832-8035   °[facility]    (336) 832-7272   °[facility] [address] °[place], [place] [address] (336) 832-4777   °[facility] [address], °[place] (336) 271-4999   °[facility]    (336) 373-0678   °[facility]    (336) 272-1050   °[facility] ° [address], [place] Phone:  (336) 832-4444, Fax:  (336) 832-4440 Hours of Operation:  9 am - 6 pm, M-F.  Also accepts Medicaid/Medicare and self-pay.  °[facility] ° [address], [place] Phone: (336) 832-3150, Fax: (336) 832-3151. Hours of Operation:  8:30 am - 5:30 pm, M-F.  Also accepts Medicaid and self-pay.  °[facility] [place] [address], [place] Phone: (336) 878-6027   °[facility] [address], [place], [place] (336)723-1848, Ext. 123 Mondays & Thursdays: 7-9 AM.  First 15 patients are seen on a first come, first serve basis. °  ° °Medicaid-accepting [place] Providers: ° °Organization         Address  Phone   Notes  °[facility] [address], [place] (336) 641-2100 Also accepts self-pay patients.  °[facility] [address], [place] ° (336) 856-9996   °[facility] [address], [place] (336) 288-8857   °[facility] [address] [place] [address], [place] (336) 299-7000   °Veita Bland [address], [place]  ° (336) 373-1557 Only accepts [place] Access Medicaid patients after they have their name applied to their card.  ° °Self-Pay (no insurance) in  [place]: ° °Organization         Address  Phone   Notes  °Sickle Cell Patients, [facility] [address], [place] (336) 832-1970   °[facility] [address], [place] (336) 832-4400   ° [facility] ° [address], [place] (336) 992-4800   °[facility]/Dr. Osei-Bonsu ° [address] [place] [address], [place] or [address], [place] (336) 841-8500 Phone number for both [place] and [place] locations is the same.  °[facility] [address], [place] (336) 299-0000   °[facility] [address] [place] [address],  or [address] (336) 852-7530 °(336) 878-2260   °[facility] [address],  (336) 350-1642,   phone; (336) 294-5005, fax Sees patients 1st and 3rd Saturday of every month.  Must not qualify for public or private insurance (i.e. Medicaid, Medicare, [place] Health Choice, Veterans' Benefits) • Household income should be no more than 200% of the poverty level •The clinic cannot treat you if you are pregnant or think you are pregnant • Sexually transmitted diseases are not treated at the clinic.  ° ° °Dental Care: °Organization         Address  Phone  Notes  °[facility] [address], [place] (336) 641-6152 Accepts children up to age 21 who are enrolled in Medicaid or [place] Health Choice; pregnant women with a Medicaid card; and children who have applied for Medicaid or [place] Health Choice, but were declined, whose parents can pay a reduced fee at time of service.  °[facility] [place]  [address], [place] (336) 641-7733 Accepts children up to age 21 who are enrolled in Medicaid or [place] Health Choice; pregnant women with a Medicaid card; and children who have applied for Medicaid or [place] Health Choice, but were declined, whose  parents can pay a reduced fee at time of service.  °[facility] ° [address], [place] (336) 641-4533 Patients are seen by appointment only. Walk-ins are not accepted. [facility] will see patients 18 years of age and older. °Monday - Tuesday (8am-5pm) °Most Wednesdays (8:30-5pm) °$30 per visit, cash only  °[facility] ° [address], [place] (336) 641-4533 Patients are seen by appointment only. Walk-ins are not accepted. [facility] will see patients 18 years of age and older. °One Wednesday Evening (Monthly: Volunteer Based).  $30 per visit, cash only  °[facility]  (919) [phone] for adults; Children under age 4, call Graduate Pediatric Dentistry at ([phone]) [phone]. Children aged 4-14, please call (919) [phone] to request a pediatric application. ° Dental services are provided in all areas of dental care including fillings, crowns and bridges, complete and partial dentures, implants, gum treatment, root canals, and extractions. Preventive care is also provided. Treatment is provided to both adults and children. °Patients are selected via a lottery and there is often a waiting list. °  °[facility] [address], °[place] ° (336) 763-8833 [email or web address] °  °[facility] [address], [place], [place] (336)723-1848, Ext. 123 Second and Fourth Thursday of each month, opens at 6:30 AM; Clinic ends at 9 AM.  Patients are seen on a first-come first-served basis, and a limited number are seen during each clinic.  ° °[facility] ° [address], [place], [place] (336) 723-7904   Eligibility Requirements °You must have lived in [place], [place], or [place] counties for at least the last three months. °  You cannot be eligible for state or federal sponsored healthcare insurance, including Veterans Administration, Medicaid, or Medicare. °  You generally cannot be eligible for  healthcare insurance through your employer.  °  How to apply: °Eligibility screenings are held every Tuesday and Wednesday afternoon from 1:00 pm until 4:00 pm. You do not need an appointment for the interview!  °[facility] [address], [place], [place] 336-631-2330   °[facility]  336-342-8273   °[facility]  336-703-3100   °[facility]  336-570-6415   ° °Behavioral Health Resources in   the Community: °Intensive Outpatient Programs °Organization         Address  Phone  Notes  °[facility] [address], [place], [place] 336-878-6098   °[facility] [address], [place], [place] 336-832-9800   °[facility] [address], [place], [place] ° 336-882-2125   °[facility] [address],  °[place], [place] 1-800-853-5163 or 336-641-4981   °Substance Abuse Resources °Organization         Address  Phone  Notes  °[facility]  336-882-2125   °[facility]  336-784-9470   °[facility]  336-285-9073   °[facility]  336-845-3988   °Residential & Outpatient Substance Abuse Program  1-800-659-3381   °Psychological Services °Organization         Address  Phone  Notes  °[facility]  336- 832-9600   °[facility]  336- 378-7881   °[facility] [address], [place] 1-800-853-5163 or 336-641-4981   ° °Mobile Crisis Teams °Organization         Address  Phone  Notes  °[facility], Mobile Crisis Care Unit  1-877-626-1772   °Assertive °[facility] ° [address] [place], [place] 336-834-9664   °Sharon DeEsch [address] °[place] 336-554-5454   ° °Self-Help/Support Groups °Organization         Address  Phone             Notes  °[facility] - variety of support groups  336- 373-1402 Call for more information  °Narcotics  Anonymous (NA), [facility] [address], °[place]  2 meetings at this location  ° °Residential Treatment Programs °Organization         Address  Phone  Notes  °[facility] [address],    °[place]  1-866-801-8205   °[facility] ° [address], [place], [place] 704-293-8524   °[facility] [address], [place] 336-845-3988 Admissions: 8am-3pm M-F  °[facility] [address]    °[place], [place] 336-841-1104   °[facility] [address], [place], [place] 336-379-7146   °[facility] [address]  °[place], [place] 336-285-9073   °[facility] Intensive Outpatient [address], [place], [place] 336-852-3033   °[facility] [address]  °[place], [place] 1-877-615-2722 or 336-784-9470   °[facility] [address] [place], [place] 336-227-7417 Accepts Medicaid  °[facility] [address]  ° [place] 1-800-659-3381 Substance Abuse/Addiction Treatment  ° °[place] Behavioral Health Resources °Organization         Address  Phone  Notes  °[facility]  (888) 581-9988   °Julie Brannon, PhD [address] [place], [place]   (336) 349-5553 or (336) 951-0000   °[facility]   [address] °[place], [place] (336) 349-4454   °[facility] [address], [place], [place] (336) 342-8316 Insurance/Medicaid/sponsorship through [facility]  °[facility] [address]                                    [place], [place] (336) 342-8316 Therapy/tele-psych/case  °[facility] [address]  ° [place],  (336) 349-2233    °  Dr. Arfeen  (336) 349-4544   °[facility] [place]  [employer] [place] [facility] 1) [address], [place] °2) [address], [place] °3)  [address], [place] (336) 349-3220 °(336)  342-7768 ° °(336) 342-8140   °[place] [facility] (336) 342-1394 or (336) 342-3537 (After Hours)    ° ° ° °

## 2014-11-25 ENCOUNTER — Emergency Department (HOSPITAL_COMMUNITY)
Admission: EM | Admit: 2014-11-25 | Discharge: 2014-11-25 | Disposition: A | Discharge status: Home / Self Care | Payer: Medicaid Other | Attending: Emergency Medicine | Admitting: Emergency Medicine

## 2014-11-25 ENCOUNTER — Encounter (HOSPITAL_COMMUNITY): Payer: Self-pay | Admitting: *Deleted

## 2014-11-25 DIAGNOSIS — Z791 Long term (current) use of non-steroidal anti-inflammatories (NSAID): Secondary | ICD-10-CM | POA: Diagnosis not present

## 2014-11-25 DIAGNOSIS — J020 Streptococcal pharyngitis: Secondary | ICD-10-CM | POA: Diagnosis not present

## 2014-11-25 DIAGNOSIS — M791 Myalgia: Secondary | ICD-10-CM | POA: Insufficient documentation

## 2014-11-25 DIAGNOSIS — J029 Acute pharyngitis, unspecified: Secondary | ICD-10-CM | POA: Diagnosis present

## 2014-11-25 LAB — RAPID STREP SCREEN (MED CTR MEBANE ONLY): STREPTOCOCCUS, GROUP A SCREEN (DIRECT): POSITIVE — AB

## 2014-11-25 MED ORDER — IBUPROFEN 600 MG PO TABS: 600.0000 mg | ORAL_TABLET | Freq: Four times a day (QID) | ORAL | Status: DC | PRN

## 2014-11-25 MED ORDER — HYDROCODONE-ACETAMINOPHEN 7.5-325 MG/15ML PO SOLN: 15.0000 mL | Freq: Three times a day (TID) | ORAL | Status: DC | PRN

## 2014-11-25 MED ORDER — PENICILLIN G BENZATHINE 1200000 UNIT/2ML IM SUSP
1.2000 10*6.[IU] | Freq: Once | INTRAMUSCULAR | Status: AC
  Administered 2014-11-25: 1.2 10*6.[IU] via INTRAMUSCULAR
  Filled 2014-11-25: qty 2

## 2014-11-25 NOTE — ED Notes (Signed)
Pt reports sore throat and body aches with weakness since 0600 yesterday.

## 2014-11-25 NOTE — ED Provider Notes (Signed)
CSN: [id number]     Arrival date & time 11/25/14  0021 History   First MD Initiated Contact with Patient 11/25/14 0117     Chief Complaint  Patient presents with  . Generalized Body Aches  . Sore Throat    (Consider location/radiation/quality/duration/timing/severity/associated sxs/prior Treatment) Patient is a 24 y.o. female presenting with pharyngitis. The history is provided by the patient. No language interpreter was used.  Sore Throat This is a new problem. The current episode started yesterday. The problem occurs constantly. The problem has been unchanged. Associated symptoms include myalgias and a sore throat. Pertinent negatives include no coughing, fever, neck pain, rash or vomiting. The symptoms are aggravated by swallowing. She has tried acetaminophen and NSAIDs for the symptoms. The treatment provided mild relief.    History reviewed. No pertinent past medical history. History reviewed. No pertinent past surgical history. No family history on file. History  Substance Use Topics  . Smoking status: Current Every Day Smoker -- 0.50 packs/day    Types: Cigarettes  . Smokeless tobacco: Never Used  . Alcohol Use: Yes     Comment: occasionally   OB History    No data available      Review of Systems  Constitutional: Negative for fever.  HENT: Positive for sore throat. Negative for drooling and trouble swallowing.   Respiratory: Negative for cough.   Gastrointestinal: Negative for vomiting.  Musculoskeletal: Positive for myalgias. Negative for neck pain.  Skin: Negative for rash.  All other systems reviewed and are negative.   Allergies  Review of patient's allergies indicates no known allergies.  Home Medications   Prior to Admission medications   Medication Sig Start Date End Date Taking? Authorizing Provider  acetaminophen (TYLENOL) 500 MG tablet Take 1,000 mg by mouth every 6 (six) hours as needed for mild pain.   Yes Historical Provider, MD   medroxyPROGESTERone (DEPO-PROVERA) 150 MG/ML injection Inject 150 mg into the muscle every 3 (three) months.   Yes Historical Provider, MD  Naproxen Sodium 220 MG CAPS Take 220 mg by mouth daily as needed (pain).   Yes Historical Provider, MD  HYDROcodone-acetaminophen (HYCET) 7.5-325 mg/15 ml solution Take 15 mLs by mouth every 8 (eight) hours as needed for moderate pain (Do not drive after taking). 11/25/14   Antony Madura, PA-C  ibuprofen (ADVIL,MOTRIN) 600 MG tablet Take 1 tablet (600 mg total) by mouth every 6 (six) hours as needed. 11/25/14   Antony Madura, PA-C  naproxen (NAPROSYN) 500 MG tablet Take 1 tablet (500 mg total) by mouth 2 (two) times daily. Patient not taking: Reported on 11/25/2014 07/30/14   Harle Battiest, NP  ondansetron (ZOFRAN-ODT) 4 MG disintegrating tablet Take 1 tablet (4 mg total) by mouth every 8 (eight) hours as needed for nausea or vomiting. Patient not taking: Reported on 08/31/2014 04/28/14   Shon Baton, MD   BP 112/64 mmHg  Pulse 104  Temp(Src) 99 F (37.2 C) (Oral)  Resp 18  Ht  (1.626 m)  Wt 98 lb (44.453 kg)  BMI 16.81 kg/m2  SpO2 100%  LMP 11/11/2014   Physical Exam  Constitutional: She is oriented to person, place, and time. She appears well-developed and well-nourished. No distress.  HENT:  Head: Normocephalic and atraumatic.  Right Ear: Tympanic membrane, external ear and ear canal normal.  Left Ear: Tympanic membrane, external ear and ear canal normal.  Mouth/Throat: Uvula is midline and mucous membranes are normal. No trismus in the jaw. No uvula swelling. Oropharyngeal exudate and  posterior oropharyngeal erythema present. No posterior oropharyngeal edema or tonsillar abscesses.  Uvula midline. Patient tolerating secretions without difficulty. Tonsils 1+ bilaterally with mild exudates  Eyes: Conjunctivae and EOM are normal. No scleral icterus.  Neck: Normal range of motion.  Cardiovascular: Normal rate, regular rhythm and intact  distal pulses.   Pulmonary/Chest: Effort normal. No respiratory distress.  Musculoskeletal: Normal range of motion.  Neurological: She is alert and oriented to person, place, and time.  Skin: Skin is warm and dry. No rash noted. She is not diaphoretic. No erythema. No pallor.  Psychiatric: She has a normal mood and affect. Her behavior is normal.  Nursing note and vitals reviewed.   ED Course  Procedures (including critical care time) Labs Review Labs Reviewed  RAPID STREP SCREEN - Abnormal; Notable for the following:    Streptococcus, Group A Screen (Direct) POSITIVE (*)    All other components within normal limits    Imaging Review No results found.   EKG Interpretation None      MDM   Final diagnoses:  Strep pharyngitis    Pt febrile with tonsillar exudate, cervical lymphadenopathy, and dysphagia; diagnosis of strep. Treated in the ED with PCN IM. Discussed importance of PO fluid hydration. Presentation not concerning for PTA or infxn spread to soft tissue. No trismus or uvula deviation. Specific return precautions discussed. Recommended PCP follow up. Patient discharged in good condition with no unaddressed concerns.   Filed Vitals:   11/25/14 0027  BP: 112/64  Pulse: 104  Temp: 99 F (37.2 C)  TempSrc: Oral  Resp: 18  Height: 5\' 4"  (1.626 m)  Weight: 98 lb (44.453 kg)  SpO2: 100%       Antony MaduraKelly Ramari Bray, PA-C 11/25/14 0151  Marisa Severinlga Otter, MD 11/25/14 [phone]

## 2014-12-25 ENCOUNTER — Emergency Department (INDEPENDENT_AMBULATORY_CARE_PROVIDER_SITE_OTHER)
Admission: EM | Admit: 2014-12-25 | Discharge: 2014-12-25 | Disposition: A | Discharge status: Home / Self Care | Payer: Medicaid Other | Source: Home / Self Care | Attending: Emergency Medicine | Admitting: Emergency Medicine

## 2014-12-25 ENCOUNTER — Encounter (HOSPITAL_COMMUNITY): Payer: Self-pay | Admitting: Emergency Medicine

## 2014-12-25 DIAGNOSIS — K2970 Gastritis, unspecified, without bleeding: Secondary | ICD-10-CM

## 2014-12-25 LAB — POCT URINALYSIS DIP (DEVICE)
Bilirubin Urine: NEGATIVE
Glucose, UA: NEGATIVE mg/dL
Hgb urine dipstick: NEGATIVE
KETONES UR: NEGATIVE mg/dL
LEUKOCYTES UA: NEGATIVE
NITRITE: NEGATIVE
Protein, ur: NEGATIVE mg/dL
Specific Gravity, Urine: 1.02 (ref 1.005–1.030)
Urobilinogen, UA: 1 mg/dL (ref 0.0–1.0)
pH: 7 (ref 5.0–8.0)

## 2014-12-25 LAB — POCT PREGNANCY, URINE: PREG TEST UR: NEGATIVE

## 2014-12-25 MED ORDER — ONDANSETRON HCL 4 MG PO TABS: 4.0000 mg | ORAL_TABLET | Freq: Three times a day (TID) | ORAL | Status: DC | PRN

## 2014-12-25 MED ORDER — ONDANSETRON 4 MG PO TBDP
4.0000 mg | ORAL_TABLET | Freq: Once | ORAL | Status: AC
  Administered 2014-12-25: 4 mg via ORAL

## 2014-12-25 MED ORDER — ONDANSETRON 4 MG PO TBDP
ORAL_TABLET | ORAL | Status: AC
  Filled 2014-12-25: qty 1

## 2014-12-25 NOTE — Discharge Instructions (Signed)
You likely have a stomach bug. You may develop some loose stool over the next several days. Take Zofran every 8 hours as needed for nausea and vomiting. Make sure you are drinking plenty of fluids. Follow-up as needed.

## 2014-12-25 NOTE — ED Provider Notes (Addendum)
CSN: [id number]     Arrival date & time 12/25/14  1635 History   First MD Initiated Contact with Patient 12/25/14 1652     Chief Complaint  Patient presents with  . Nausea   (Consider location/radiation/quality/duration/timing/severity/associated sxs/prior Treatment) HPI  She is a 24 year old woman here for evaluation of nausea and vomiting. Her symptoms started 2 days ago with nausea and nonbloody nonbilious vomiting. Last emesis was yesterday morning. She continues to have nausea. She denies any abdominal pain, diarrhea, fevers. She is able to tolerate small amounts of by mouth. Her last period was approximately 4 weeks ago.  History reviewed. No pertinent past medical history. History reviewed. No pertinent past surgical history. No family history on file. History  Substance Use Topics  . Smoking status: Current Every Day Smoker -- 0.50 packs/day    Types: Cigarettes  . Smokeless tobacco: Never Used  . Alcohol Use: Yes     Comment: occasionally   OB History    No data available     Review of Systems As in history of present illness Allergies  Review of patient's allergies indicates no known allergies.  Home Medications   Prior to Admission medications   Medication Sig Start Date End Date Taking? Authorizing Provider  acetaminophen (TYLENOL) 500 MG tablet Take 1,000 mg by mouth every 6 (six) hours as needed for mild pain.    Historical Provider, MD  HYDROcodone-acetaminophen (HYCET) 7.5-325 mg/15 ml solution Take 15 mLs by mouth every 8 (eight) hours as needed for moderate pain (Do not drive after taking). 11/25/14   Antony MaduraKelly Humes, PA-C  ibuprofen (ADVIL,MOTRIN) 600 MG tablet Take 1 tablet (600 mg total) by mouth every 6 (six) hours as needed. 11/25/14   Antony MaduraKelly Humes, PA-C  medroxyPROGESTERone (DEPO-PROVERA) 150 MG/ML injection Inject 150 mg into the muscle every 3 (three) months.    Historical Provider, MD  Naproxen Sodium 220 MG CAPS Take 220 mg by mouth daily as needed (pain).     Historical Provider, MD  ondansetron (ZOFRAN) 4 MG tablet Take 1 tablet (4 mg total) by mouth every 8 (eight) hours as needed for nausea or vomiting. 12/25/14   Charm RingsErin J Harlo Fabela, MD   BP 105/70 mmHg  Pulse 82  Temp(Src) 98.4 F (36.9 C) (Oral)  Resp 16  SpO2 97%  LMP 11/25/2014 Physical Exam  Constitutional: She is oriented to person, place, and time. She appears well-developed and well-nourished. No distress.  Cardiovascular: Normal rate, regular rhythm and normal heart sounds.   No murmur heard. Pulmonary/Chest: Effort normal and breath sounds normal. No respiratory distress. She has no wheezes. She has no rales.  Abdominal: Soft. Bowel sounds are normal. She exhibits no distension. There is no tenderness. There is no rebound and no guarding.  Neurological: She is alert and oriented to person, place, and time.    ED Course  Procedures (including critical care time) Labs Review Labs Reviewed  POCT URINALYSIS DIP (DEVICE)  POCT PREGNANCY, URINE    Imaging Review No results found.   MDM   1. Gastritis    Zofran 4 mg ODT given.  Likely viral illness. Symptomatic treatment with Zofran as needed. Work note provided. Follow-up as needed.    Charm RingsErin J Dallas Torok, MD 12/25/14 1745  Charm RingsErin J Leo Weyandt, MD 12/25/14 [phone]

## 2014-12-25 NOTE — ED Notes (Signed)
Patient c/o nausea and vomiting x 2 days. Patient reports she has taken ibuprofen with no relief. Patient reports she has vomitting approx 3 x yesterday. Patient denies any problems with bowel movements. Patient is in NAD.

## 2015-08-25 LAB — OB RESULTS CONSOLE GBS: GBS: NEGATIVE

## 2015-10-03 ENCOUNTER — Encounter (HOSPITAL_COMMUNITY): Payer: Self-pay | Admitting: Emergency Medicine

## 2015-10-03 ENCOUNTER — Emergency Department (HOSPITAL_COMMUNITY)
Admission: EM | Admit: 2015-10-03 | Discharge: 2015-10-03 | Disposition: A | Discharge status: Home / Self Care | Payer: Self-pay | Attending: Emergency Medicine | Admitting: Emergency Medicine

## 2015-10-03 DIAGNOSIS — F17210 Nicotine dependence, cigarettes, uncomplicated: Secondary | ICD-10-CM | POA: Insufficient documentation

## 2015-10-03 DIAGNOSIS — R6883 Chills (without fever): Secondary | ICD-10-CM | POA: Insufficient documentation

## 2015-10-03 DIAGNOSIS — R51 Headache: Secondary | ICD-10-CM | POA: Insufficient documentation

## 2015-10-03 DIAGNOSIS — R059 Cough, unspecified: Secondary | ICD-10-CM

## 2015-10-03 DIAGNOSIS — M791 Myalgia: Secondary | ICD-10-CM | POA: Insufficient documentation

## 2015-10-03 DIAGNOSIS — R52 Pain, unspecified: Secondary | ICD-10-CM

## 2015-10-03 DIAGNOSIS — R05 Cough: Secondary | ICD-10-CM | POA: Insufficient documentation

## 2015-10-03 MED ORDER — ONDANSETRON 4 MG PO TBDP: 4.0000 mg | ORAL_TABLET | Freq: Three times a day (TID) | ORAL | Status: DC | PRN

## 2015-10-03 MED ORDER — IBUPROFEN 800 MG PO TABS: 800.0000 mg | ORAL_TABLET | Freq: Three times a day (TID) | ORAL | Status: DC

## 2015-10-03 MED ORDER — BENZONATATE 100 MG PO CAPS: 100.0000 mg | ORAL_CAPSULE | Freq: Three times a day (TID) | ORAL | Status: DC

## 2015-10-03 NOTE — ED Notes (Signed)
C/O feeling "weak" and headache since yesterday.

## 2015-10-03 NOTE — ED Provider Notes (Signed)
CSN: [id number]     Arrival date & time 10/03/15  1023 History  By signing my name below, I, Freida Busman, attest that this documentation has been prepared under the direction and in the presence of non-physician practitioner, Harolyn Rutherford, PA-C. Electronically Signed: Freida Busman, Scribe. 10/03/2015. 12:34 PM.    Chief Complaint  Patient presents with  . Generalized Body Aches    The history is provided by the patient. No language interpreter was used.     HPI Comments:  Marcia Jackson is a 25 y.o. female who presents to the Emergency Department complaining of generalized body aches since last night. She reports associated mild HA, chills and mild occasional cough. Rates her headache at 4 out of 10, located bilaterally globally, throbbing, nonradiating. She denies nausea, vomiting, abdominal pain, fever, dysuria, shortness of breath, chest pain, or any other complaints. No alleviating factors noted. She reports sick contacts at home with same symptoms.    History reviewed. No pertinent past medical history. History reviewed. No pertinent past surgical history. History reviewed. No pertinent family history. Social History  Substance Use Topics  . Smoking status: Current Every Day Smoker -- 0.50 packs/day    Types: Cigarettes  . Smokeless tobacco: Never Used  . Alcohol Use: Yes     Comment: occasionally   OB History    No data available     Review of Systems  Constitutional: Positive for chills. Negative for fever.  Respiratory: Positive for cough.   Gastrointestinal: Negative for nausea, vomiting and abdominal pain.  Genitourinary: Negative for dysuria and difficulty urinating.  Musculoskeletal: Positive for myalgias (generalized).  Neurological: Positive for headaches.  All other systems reviewed and are negative.   Allergies  Review of patient's allergies indicates no known allergies.  Home Medications   Prior to Admission medications   Medication Sig Start Date End  Date Taking? Authorizing Provider  acetaminophen (TYLENOL) 500 MG tablet Take 1,000 mg by mouth every 6 (six) hours as needed for mild pain.    Historical Provider, MD  benzonatate (TESSALON) 100 MG capsule Take 1 capsule (100 mg total) by mouth every 8 (eight) hours. 10/03/15   Shawn C Joy, PA-C  HYDROcodone-acetaminophen (HYCET) 7.5-325 mg/15 ml solution Take 15 mLs by mouth every 8 (eight) hours as needed for moderate pain (Do not drive after taking). 11/25/14   Antony Madura, PA-C  ibuprofen (ADVIL,MOTRIN) 600 MG tablet Take 1 tablet (600 mg total) by mouth every 6 (six) hours as needed. 11/25/14   Antony Madura, PA-C  ibuprofen (ADVIL,MOTRIN) 800 MG tablet Take 1 tablet (800 mg total) by mouth 3 (three) times daily. 10/03/15   Shawn C Joy, PA-C  medroxyPROGESTERone (DEPO-PROVERA) 150 MG/ML injection Inject 150 mg into the muscle every 3 (three) months.    Historical Provider, MD  Naproxen Sodium 220 MG CAPS Take 220 mg by mouth daily as needed (pain).    Historical Provider, MD  ondansetron (ZOFRAN ODT) 4 MG disintegrating tablet Take 1 tablet (4 mg total) by mouth every 8 (eight) hours as needed for nausea or vomiting. 10/03/15   Shawn C Joy, PA-C  ondansetron (ZOFRAN) 4 MG tablet Take 1 tablet (4 mg total) by mouth every 8 (eight) hours as needed for nausea or vomiting. 12/25/14   Charm Rings, MD   BP 101/62 mmHg  Pulse 79  Temp(Src) 97.9 F (36.6 C) (Oral)  Resp 16  Ht  (1.6 m)  Wt 44.453 kg  BMI 17.36 kg/m2  SpO2 100%  Physical Exam  Constitutional: She is oriented to person, place, and time. She appears well-developed and well-nourished. No distress.  HENT:  Head: Normocephalic and atraumatic.  Right Ear: Tympanic membrane and ear canal normal.  Left Ear: Tympanic membrane and ear canal normal.  Mouth/Throat: Oropharynx is clear and moist.  No sinus tenderness   Eyes: Conjunctivae and EOM are normal. Pupils are equal, round, and reactive to light.  Neck: Normal range of motion. Neck  supple.  Cardiovascular: Normal rate, regular rhythm and intact distal pulses.   Pulmonary/Chest: Effort normal and breath sounds normal.  Abdominal: Bowel sounds are normal. She exhibits no distension. There is no tenderness.  Lymphadenopathy:    She has no cervical adenopathy.  Neurological: She is alert and oriented to person, place, and time. She has normal reflexes.  No sensory deficits. Strength 5/5 in all extremities. No gait disturbance. Coordination intact. Cranial nerves III-XII grossly intact. No facial droop.   Skin: Skin is warm and dry.  Psychiatric: She has a normal mood and affect.  Nursing note and vitals reviewed.   ED Course  Procedures   DIAGNOSTIC STUDIES:  Oxygen Saturation is 100% on RA, normal by my interpretation.    COORDINATION OF CARE:  12:33 PM Discussed treatment plan with pt at bedside and pt agreed to plan.   MDM   Final diagnoses:  Body aches  Cough  Chills     SHAELYNN DRAGOS presents with body aches, cough, and headache since yesterday.  Pt symptoms consistent with URI. Patient has no red flag symptoms, is well-appearing, and has a normal neuro exam. Pt will be discharged with symptomatic treatment.  Discussed return precautions.  Pt is hemodynamically stable & in NAD prior to discharge.  Filed Vitals:   10/03/15 1117 10/03/15 1242  BP: 92/85 101/62  Pulse: 78 79  Temp: 97.9 F (36.6 C)   TempSrc: Oral   Resp: 18 16  Height:  (1.6 m)   Weight: 44.453 kg   SpO2: 100% 100%    I personally performed the services described in this documentation, which was scribed in my presence. The recorded information has been reviewed and is accurate.   Anselm Pancoast, PA-C 10/04/15 1308  Alvira Monday, MD 10/05/15 1246

## 2015-10-03 NOTE — ED Notes (Signed)
Pt sts body aches and pain in head x 2 days

## 2015-10-03 NOTE — Discharge Instructions (Signed)
You have been seen today for body aches, cough, and chills. Your symptoms are consistent with a viral illness. Viruses do not require antibiotics. Treatment is symptomatic care. Drink plenty of fluids and get plenty of rest. Ibuprofen or Tylenol for pain or fever. Zofran for nausea. Tessalon for cough. Follow up with PCP as needed. Return to ED should symptoms worsen.   Emergency Department Resource Guide 1) Find a Doctor and Pay Out of Pocket Although you won't have to find out who is covered by your insurance plan, it is a good idea to ask around and get recommendations. You will then need to call the office and see if the doctor you have chosen will accept you as a new patient and what types of options they offer for patients who are self-pay. Some doctors offer discounts or will set up payment plans for their patients who do not have insurance, but you will need to ask so you aren't surprised when you get to your appointment.  2) Contact Your Local Health Department Not all health departments have doctors that can see patients for sick visits, but many do, so it is worth a call to see if yours does. If you don't know where your local health department is, you can check in your phone book. The CDC also has a tool to help you locate your state's health department, and many state websites also have listings of all of their local health departments.  3) Find a Walk-in Clinic If your illness is not likely to be very severe or complicated, you may want to try a walk in clinic. These are popping up all over the country in pharmacies, drugstores, and shopping centers. They're usually staffed by nurse practitioners or physician assistants that have been trained to treat common illnesses and complaints. They're usually fairly quick and inexpensive. However, if you have serious medical issues or chronic medical problems, these are probably not your best option.  No Primary Care Doctor: - Call Health Connect  at  [phone] - they can help you locate a primary care doctor that  accepts your insurance, provides certain services, etc. - Physician Referral Service- [phone]  Chronic Pain Problems: Organization         Address  Phone   Notes  [facility]  [phone] Patients need to be referred by their primary care doctor.   Medication Assistance: Organization         Address  Phone   Notes  [facility] Medication [facility] [address] [place] [place], [place] [address] [phone] --Must be a resident of [facility] -- Must have NO insurance coverage whatsoever (no Medicaid/ Medicare, etc.) -- The pt. MUST have a primary care doctor that directs their care regularly and follows them in the community   [facility]ist  [phone]   [employer]  [phone]    Agencies that provide inexpensive medical care: Organization         Address  Phone   Notes  [facility]  [phone]   [facility]    [phone]   [facility] [address] [place], [place] [address] [phone]   [facility] [address] [place] [place] [phone]   Planned Parenthood    [phone]   [facility]    [phone]   [facility] and [facility]  [address] [place], [employer] Phone:  [phone], Fax:  [phone] Hours of Operation:  9 am - 6 pm, M-F.  Also accepts Medicaid/Medicare and self-pay.  [facility] for [place], [address], [place] Phone: [phone], Fax: [phone]. Hours of Operation:  8:30 am - 5:30 pm, M-F.  Also accepts Medicaid and self-pay.  [facility] [address], [place] Phone: [phone]   [place], [place], [place] [phone], Ext. [phone] Mondays & Thursdays: 7-9 AM.  First 15 patients are seen on a first come, first serve basis.     [place] Providers:  Organization         Address  Phone   Notes  [facility] [address],  [phone] Also accepts self-pay patients.  [facility] [address] [place], [place]  [phone]   [place], [address], [place] [phone]   [facility] [address] [place] [phone]   Lucianne Lei [address], [place]   [phone] Only accepts [place] Access [place] patients after they have their name applied to their card.   Self-Pay (no insurance) in [facility]:  Organization         Address  Phone   Notes  Sickle Cell Patients, [facility] [place] [facility] Internal Medicine [place] [phone]   [facility] [place] [phone]   [facility] [place]  [place], [place], [place] [phone]   [facility]/Dr. Osei-Bonsu  [address], [place] or [address], [place] [phone] Phone number for both [place] and [place] locations is the same.  Urgent Medical and [facility] [address] [place] [phone]   [facility] [address] [place] or [address] [phone]   [facility] [address], [place] [phone], phone; [phone], fax Sees patients 1st and 3rd Saturday of every month.  Must not qualify for public or private insurance (i.e. Medicaid, Medicare, [place] Health Choice, Veterans' Benefits)  Household income should be no more than 200% of the poverty level The clinic cannot treat you if you are pregnant or think you are pregnant  Sexually transmitted diseases are not treated at the clinic.    Dental Care: Organization         Address  Phone  Notes  [facility]  Department of [facility] [place] [phone] Accepts children up to age 68 who are enrolled in [place] or [place]; pregnant women with a Medicaid card; and children who have applied for Medicaid or [place] Health Choice, but were declined, whose parents can pay a reduced fee at time of service.  [facility] Department of [facility]  [address], [place] [phone] Accepts children up to age 61 who are enrolled in [place] or [place]; pregnant women with a Medicaid card; and children who have applied for Medicaid or [place] Health Choice, but were declined, whose parents can pay a reduced fee at time of service.  [place] Adult [facility] Access PRO[facility]  [place] [phone] Patients are seen by appointment only. Walk-ins  are not accepted. [place] will see patients 35 years of age and older. Monday - Tuesday (8am-5pm) Most Wednesdays (8:30-5pm) $30 per visit, cash only  [facility] Adult [facility] Access PRO[facility]  [address], [facility] [phone] Patients are seen by appointment only. Walk-ins are not accepted. [place] will see patients 30 years of age and older. One Wednesday Evening (Monthly: Volunteer Based).  $30 per visit, cash only  [place]  [phone] for adults; Children under age 99, call Graduate Pediatric Dentistry at [phone]. Children aged 48-14, please call [phone] to request a pediatric application.  Dental services are provided in all areas of dental care including fillings, crowns and bridges, complete and partial dentures, implants, gum treatment, root canals, and extractions. Preventive care is also provided. Treatment is provided to both adults and children. Patients are selected via a lottery and there is often a waiting list.   [facility] [address], [place]  [phone]  [email or web address]   [facility] [address] [place], [place] [phone], Ext. [phone] Second and Fourth Thursday of each month, opens at 6:30 AM; Clinic ends at 9 AM.  Patients are seen on a first-come first-served basis, and a limited number are seen during each clinic.   [facility]  [address] [place], [place] [phone]   Eligibility Requirements You must have lived in [place], [place], or [place] counties for at least the last three months.   You cannot be eligible for state or federal sponsored [employer], including [employer], [place], or [employer].   You generally cannot be eligible for healthcare insurance through your employer.    How to apply: Eligibility screenings are held every Tuesday and Wednesday afternoon from 1:00 pm until 4:00 pm. You do not need an appointment for the interview!  [facility] [address] [place], [place]   [place] Department  [place]  [phone]    Behavioral Health Resources in the Community: Intensive Outpatient Programs Organization         Address  Phone  Notes  [place] [address], [place], [place] [phone]   [facility] Outpatient [address] [place], [place]   ADS: Alcohol & Drug [facility] [address], [place], [place]   [place] [address]  [place], [place] or [phone]   Substance Abuse Resources Organization         Address  Phone  Notes  Alcohol and Drug Services  [phone]   [place]  [phone]   [facility] [place]   Chinita Pester  [phone]   Residential & Outpatient Substance Abuse Program  [phone]   Psychological Services Organization          Address  Phone  Notes  [facility] [place]  [place]  [phone]   [place] [address], [place] [phone] or [phone]    Mobile Crisis Teams Organization         Address  Phone  Notes  [facility], Mobile Crisis Care Unit  [phone]   [facility]ive Psychotherapeutic Services  [address] [place], [place]   [facility] [address], [place] [phone]  Self-Help/Support Groups Organization         Address  Phone             Notes  [facility]ental [facility]oc[facility] of [place] - variety of support groups  [place] Call for more information  Narcotics Anonymous (NA), [facility]aring [facility] [address], [employer] [place]  2 meetings at this location   Special educational needs teacher         Address  Phone  Notes  ASAP Residential Treatment [place],    [place]  [phone]   [facility]  [address] [place] [phone], [place], [place]   [place], [place] [phone] Admissions: 8am-3pm M-F  Incentives Substance [place] [address]    [place], [place] [phone]   [facility] [address] [place], [place], [place]   [facility] [address]  [place], [place]   [facility] Programs - Intensive Outpatient [address], [place], [place]   [facility] [address]  [place], [place] [phone] or [phone]   [facility] Services (RTS) [address] [place], [place] Accepts Medicaid  [facility] [place] [address]  [place] [phone] Substance Abuse/Addiction Treatment   [facility] Organization         Address  Phone  Notes  [facility]  [phone]   Domenic Schwab, PhD [address] Arlis [place], [place]   [phone] or [phone]   [place], [place] [phone]   [facility] [address] [place], [place] [phone] Insurance/Medicaid/sponsorship through [facility] and Families [address], [address]                                    [place], [place] [phone] [place] [address], [place] [phone]    Dr. Adele Schilder  [phone]   [facility] Dept. 1) [address]  2) [place] 3)  [address], [place] [phone]  [phone]   [place] [phone] or [phone] (After Hours)

## 2016-03-31 ENCOUNTER — Emergency Department (HOSPITAL_COMMUNITY)
Admission: EM | Admit: 2016-03-31 | Discharge: 2016-03-31 | Disposition: A | Discharge status: Home / Self Care | Payer: Self-pay | Attending: Emergency Medicine | Admitting: Emergency Medicine

## 2016-03-31 ENCOUNTER — Emergency Department (HOSPITAL_COMMUNITY): Payer: Self-pay

## 2016-03-31 ENCOUNTER — Encounter (HOSPITAL_COMMUNITY): Payer: Self-pay

## 2016-03-31 DIAGNOSIS — E889 Metabolic disorder, unspecified: Secondary | ICD-10-CM

## 2016-03-31 DIAGNOSIS — Z3490 Encounter for supervision of normal pregnancy, unspecified, unspecified trimester: Secondary | ICD-10-CM

## 2016-03-31 DIAGNOSIS — Z79899 Other long term (current) drug therapy: Secondary | ICD-10-CM | POA: Insufficient documentation

## 2016-03-31 DIAGNOSIS — O9989 Other specified diseases and conditions complicating pregnancy, childbirth and the puerperium: Secondary | ICD-10-CM | POA: Insufficient documentation

## 2016-03-31 DIAGNOSIS — R102 Pelvic and perineal pain: Secondary | ICD-10-CM | POA: Insufficient documentation

## 2016-03-31 DIAGNOSIS — R55 Syncope and collapse: Secondary | ICD-10-CM | POA: Insufficient documentation

## 2016-03-31 DIAGNOSIS — O99332 Smoking (tobacco) complicating pregnancy, second trimester: Secondary | ICD-10-CM | POA: Insufficient documentation

## 2016-03-31 DIAGNOSIS — Z3A20 20 weeks gestation of pregnancy: Secondary | ICD-10-CM | POA: Insufficient documentation

## 2016-03-31 DIAGNOSIS — F17210 Nicotine dependence, cigarettes, uncomplicated: Secondary | ICD-10-CM | POA: Insufficient documentation

## 2016-03-31 LAB — BASIC METABOLIC PANEL
ANION GAP: 6 (ref 5–15)
CO2: 21 mmol/L — ABNORMAL LOW (ref 22–32)
Calcium: 8.3 mg/dL — ABNORMAL LOW (ref 8.9–10.3)
Chloride: 109 mmol/L (ref 101–111)
Creatinine, Ser: 0.45 mg/dL (ref 0.44–1.00)
GFR calc Af Amer: >60 mL/min (ref >60)
GFR calc non Af Amer: >60 mL/min (ref >60)
GLUCOSE: 100 mg/dL — AB (ref 65–99)
POTASSIUM: 3.4 mmol/L — AB (ref 3.5–5.1)
Sodium: 136 mmol/L (ref 135–145)

## 2016-03-31 LAB — URINALYSIS, ROUTINE W REFLEX MICROSCOPIC
Bilirubin Urine: NEGATIVE
Glucose, UA: NEGATIVE mg/dL
Hgb urine dipstick: NEGATIVE
Ketones, ur: NEGATIVE mg/dL
NITRITE: NEGATIVE
PH: 7.5 (ref 5.0–8.0)
Protein, ur: NEGATIVE mg/dL
Specific Gravity, Urine: 1.017 (ref 1.005–1.030)

## 2016-03-31 LAB — WET PREP, GENITAL
Trich, Wet Prep: NONE SEEN
YEAST WET PREP: NONE SEEN

## 2016-03-31 LAB — CBC
HEMATOCRIT: 32.5 % — AB (ref 36.0–46.0)
Hemoglobin: 11 g/dL — ABNORMAL LOW (ref 12.0–15.0)
MCH: 30.2 pg (ref 26.0–34.0)
MCHC: 33.8 g/dL (ref 30.0–36.0)
MCV: 89.3 fL (ref 78.0–100.0)
Platelets: 174 10*3/uL (ref 150–400)
RBC: 3.64 MIL/uL — ABNORMAL LOW (ref 3.87–5.11)
RDW: 12.5 % (ref 11.5–15.5)
WBC: 10.8 10*3/uL — AB (ref 4.0–10.5)

## 2016-03-31 LAB — URINE MICROSCOPIC-ADD ON: RBC / HPF: NONE SEEN RBC/hpf (ref 0–5)

## 2016-03-31 LAB — I-STAT BETA HCG BLOOD, ED (MC, WL, AP ONLY): I-stat hCG, quantitative: >2000 m[IU]/mL — ABNORMAL HIGH (ref <5)

## 2016-03-31 LAB — HCG, QUANTITATIVE, PREGNANCY: hCG, Beta Chain, Quant, S: 27643 m[IU]/mL — ABNORMAL HIGH (ref <5)

## 2016-03-31 MED ORDER — SODIUM CHLORIDE 0.9 % IV BOLUS (SEPSIS)
1000.0000 mL | Freq: Once | INTRAVENOUS | Status: AC
  Administered 2016-03-31: 1000 mL via INTRAVENOUS

## 2016-03-31 NOTE — ED Provider Notes (Signed)
[facility]-EMERGENCY DEPT Provider Note   CSN: [id number] Arrival date & time: 03/31/16  1026     History   Chief Complaint Chief Complaint  Patient presents with  . Loss of Consciousness    HPI Marcia Jackson is a 25 y.o. G1P1 female with no significant PMH presents with syncope and possible pregnancy. PMH significant for Chlamydia. She states that 4 days ago she experienced 3 separate episodes of syncope while she was at work. She works in a [employer]. She states she was walking when she felt hot, sweating, and nauseous. Denies pain. She sat down and lost consciousness. She states her coworkers were with her at the time but she does not know how long she was unconscious. She went home since she felt fatigued afterwards. Denies decreased PO intake, has never passed out before. No seizure activity, tongue biting, urinary incontinence.  She states she is also here today to confirm pregnancy. She took a home pregnancy test 2 days ago which was positive. Her estimated LMP was beginning of June. She has had an episode of lower abdominal pain today which has resolved. Denies repeat LOC, current lightheaded/dizziness, HA or head injury, blurry vision, chest pain, palpitations, SOB, current abdominal pain, N/V/D, irritative voiding symptoms, vaginal discharge or bleeding.   HPI  History reviewed. No pertinent past medical history.  There are no active problems to display for this patient.   History reviewed. No pertinent surgical history.  OB History    Gravida Para Term Preterm AB Living   1             SAB TAB Ectopic Multiple Live Births                   Home Medications    Prior to Admission medications   Medication Sig Start Date End Date Taking? Authorizing Provider  acetaminophen (TYLENOL) 500 MG tablet Take 1,000 mg by mouth every 6 (six) hours as needed for mild pain.    Historical Provider, MD  benzonatate (TESSALON) 100 MG capsule Take 1 capsule (100 mg total) by mouth every  8 (eight) hours. 10/03/15   Shawn C Joy, PA-C  HYDROcodone-acetaminophen (HYCET) 7.5-325 mg/15 ml solution Take 15 mLs by mouth every 8 (eight) hours as needed for moderate pain (Do not drive after taking). 11/25/14   Antony Madura, PA-C  ibuprofen (ADVIL,MOTRIN) 600 MG tablet Take 1 tablet (600 mg total) by mouth every 6 (six) hours as needed. 11/25/14   Antony Madura, PA-C  ibuprofen (ADVIL,MOTRIN) 800 MG tablet Take 1 tablet (800 mg total) by mouth 3 (three) times daily. 10/03/15   Shawn C Joy, PA-C  medroxyPROGESTERone (DEPO-PROVERA) 150 MG/ML injection Inject 150 mg into the muscle every 3 (three) months.    Historical Provider, MD  Naproxen Sodium 220 MG CAPS Take 220 mg by mouth daily as needed (pain).    Historical Provider, MD  ondansetron (ZOFRAN ODT) 4 MG disintegrating tablet Take 1 tablet (4 mg total) by mouth every 8 (eight) hours as needed for nausea or vomiting. 10/03/15   Shawn C Joy, PA-C  ondansetron (ZOFRAN) 4 MG tablet Take 1 tablet (4 mg total) by mouth every 8 (eight) hours as needed for nausea or vomiting. 12/25/14   Charm Rings, MD    Family History No family history on file.  Social History Social History  Substance Use Topics  . Smoking status: Current Every Day Smoker    Packs/day: 0.50    Types: Cigarettes  .  Smokeless tobacco: Never Used  . Alcohol use Yes     Comment: occasionally     Allergies   Review of patient's allergies indicates no known allergies.   Review of Systems Review of Systems  Constitutional: Negative for chills and fever.  Respiratory: Negative for shortness of breath.   Cardiovascular: Negative for chest pain and palpitations.  Gastrointestinal: Positive for abdominal pain. Negative for diarrhea, nausea and vomiting.  Genitourinary: Negative for dysuria, pelvic pain, vaginal bleeding and vaginal discharge.  Neurological: Positive for syncope. Negative for dizziness, seizures, weakness, light-headedness, numbness and headaches.      Physical Exam Updated Vital Signs BP 94/57 (BP Location: Right Arm)   Pulse 86   Temp 98.5 F (36.9 C) (Oral)   Resp 20   Ht 5\' 3"  (1.6 m)   Wt 47.1 kg   LMP 01/11/2016   SpO2 98%   BMI 18.41 kg/m   Physical Exam  Constitutional: She is oriented to person, place, and time. She appears well-developed and well-nourished. No distress.  HENT:  Head: Normocephalic and atraumatic.  Eyes: Conjunctivae are normal. Pupils are equal, round, and reactive to light. Right eye exhibits no discharge. Left eye exhibits no discharge. No scleral icterus.  Neck: Normal range of motion. Neck supple.  Cardiovascular: Normal rate, regular rhythm and intact distal pulses.  Exam reveals no gallop and no friction rub.   No murmur heard. Pulmonary/Chest: Effort normal and breath sounds normal. No respiratory distress. She has no wheezes. She has no rales. She exhibits no tenderness.  Abdominal: Soft. Bowel sounds are normal. She exhibits no distension and no mass. There is no tenderness. There is no rebound and no guarding. No hernia.  Genitourinary:  Genitourinary Comments: No inguinal lymphadenopathy or inguinal hernia noted. Normal external genitalia. No pain with speculum insertion. Closed cervical os. Chadwick sign present. No lesions. No significant discharge or bleeding noted from cervix or in vaginal vault. On bimanual examination no adnexal tenderness or cervical motion tenderness. Chaperone present during exam.    Musculoskeletal: She exhibits no edema.  Neurological: She is alert and oriented to person, place, and time.  Mental Status:  Alert, oriented, thought content appropriate, able to give a coherent history. Speech fluent without evidence of aphasia. Able to follow 2 step commands without difficulty.  Cranial Nerves:  II:  Peripheral visual fields grossly normal, pupils equal, round, reactive to light III,IV, VI: ptosis not present, extra-ocular motions intact bilaterally  V,VII:  smile symmetric, facial light touch sensation equal VIII: hearing grossly normal to voice  X: uvula elevates symmetrically  XI: bilateral shoulder shrug symmetric and strong XII: midline tongue extension without fassiculations Motor:  Normal tone. 5/5 in upper and lower extremities bilaterally including strong and equal grip strength and dorsiflexion/plantar flexion Sensory: Pinprick and light touch normal in all extremities.  Deep Tendon Reflexes: 2+ and symmetric in the biceps and patella Cerebellar: normal finger-to-nose with bilateral upper extremities Gait: normal gait and balance CV: distal pulses palpable throughout    Skin: Skin is warm and dry.  Psychiatric: She has a normal mood and affect. Her behavior is normal.  Nursing note and vitals reviewed.    ED Treatments / Results  Labs (all labs ordered are listed, but only abnormal results are displayed) Labs Reviewed  WET PREP, GENITAL - Abnormal; Notable for the following:       Result Value   Clue Cells Wet Prep HPF POC PRESENT (*)    WBC, Wet Prep HPF POC MANY (*)  All other components within normal limits  BASIC METABOLIC PANEL - Abnormal; Notable for the following:    Potassium 3.4 (*)    CO2 21 (*)    Glucose, Bld 100 (*)    BUN <5 (*)    Calcium 8.3 (*)    All other components within normal limits  CBC - Abnormal; Notable for the following:    WBC 10.8 (*)    RBC 3.64 (*)    Hemoglobin 11.0 (*)    HCT 32.5 (*)    All other components within normal limits  URINALYSIS, ROUTINE W REFLEX MICROSCOPIC (NOT AT [facility]) - Abnormal; Notable for the following:    APPearance TURBID (*)    Leukocytes, UA SMALL (*)    All other components within normal limits  HCG, QUANTITATIVE, PREGNANCY - Abnormal; Notable for the following:    hCG, Beta Chain, Quant, S 16,109 (*)    All other components within normal limits  URINE MICROSCOPIC-ADD ON - Abnormal; Notable for the following:    Squamous Epithelial / LPF 0-5 (*)     Bacteria, UA RARE (*)    All other components within normal limits  I-STAT BETA HCG BLOOD, ED ([facility], [facility], [facility] ONLY) - Abnormal; Notable for the following:    I-stat hCG, quantitative >2,000.0 (*)    All other components within normal limits  URINE CULTURE  GC/CHLAMYDIA PROBE AMP ([place]) NOT AT [facility]    EKG  EKG Interpretation None       Radiology US Ob Limited  Result Date: 03/31/2016 CLINICAL DATA:  Mid pelvic pain. Nineteen weeks 1 day pregnant. Unsure of last menstrual. EXAM: LIMITED OBSTETRIC ULTRASOUND FINDINGS: Number of Fetuses:  1 Heart Rate:  158 bpm Movement:  Present Presentation: Breech Placental Location: Fundal and posterior Previa: No Amniotic Fluid (Subjective):  Within normal limits. BPD:  4.6cm 20w  0d MATERNAL FINDINGS: Cervix:  Appears closed. Uterus/Adnexae:  No abnormality visualized. IMPRESSION: Intrauterine pregnancy of approximately 20 weeks 0 days with fetal heart rate of 158 beats per minute. This exam is performed on an emergent basis and does not comprehensively evaluate fetal size, dating, or anatomy; follow-up complete OB US should be considered if further fetal assessment is warranted. Electronically Signed   By: Jeronimo Greaves M.D.   On: 03/31/2016 14:45    Procedures Procedures (including critical care time)  Medications Ordered in ED Medications  sodium chloride 0.9 % bolus 1,000 mL (0 mLs Intravenous Stopped 03/31/16 1316)     Initial Impression / Assessment and Plan / ED Course  I have reviewed the triage vital signs and the nursing notes.  Pertinent labs & imaging results that were available during my care of the patient were reviewed by me and considered in my medical decision making (see chart for details).  Clinical Course   25 year old female presents with syncope and pregnancy. She is mildly hypotensive. Orthostatics are negative. IVF given with some improvement of BP. All other vitals are WNL and stable. Her syncopal episode was 4 days  ago. She has not had any repeat episodes. CBC is remarkable for mild anemia and leukocytosis. CMP is remarkable for mild hypokalemia, and hypocalcemia. Glucose is 100. No definitive reason for her syncope. She had prodromal symptoms and has not had any pain. Possibly vasovagal.   She also has a positive pregnancy test. Hcg is [phone] and formal US shows IUP with EGA of [redacted] weeks. UA shows rare bacteria, small leukocytes. Culture sent. Wet prep has many  WBC and Clue cells but denies vaginal complaints. G&C culture sent. Will hold off on Flagyl and defer to OBGYN. Patient informed of results and given referral for follow up with OBGYN. Advised plenty of fluids. Patient is NAD, non-toxic, with stable VS. Patient is informed of clinical course, understands medical decision making process, and agrees with plan. Opportunity for questions provided and all questions answered. Return precautions given.  Final Clinical Impressions(s) / ED Diagnoses   Final diagnoses:  Pregnancy  Syncope, unspecified syncope type    New Prescriptions New Prescriptions   No medications on file     Bethel BornKelly Marie Cheston Coury, PA-C 04/01/16 [id number]    Loren Raceravid Yelverton, MD 04/03/16 1450

## 2016-03-31 NOTE — ED Triage Notes (Signed)
Patient here with intermittent syncopal events the past week, states that she has missed her periods July and august and had home pregnancy test. No complaints, request eval for syncope and pregnancy. G2, P1 Alert and oriented

## 2016-04-01 LAB — URINE CULTURE

## 2016-04-01 LAB — GC/CHLAMYDIA PROBE AMP (~~LOC~~) NOT AT ARMC
CHLAMYDIA, DNA PROBE: POSITIVE — AB
Neisseria Gonorrhea: NEGATIVE

## 2016-04-02 ENCOUNTER — Telehealth (HOSPITAL_COMMUNITY): Payer: Self-pay

## 2016-04-02 NOTE — Telephone Encounter (Signed)
[facility]  (+) Chlamydia (pregnant).  No antibiotic treatment or Prescription given for STD.  Chart to MD office for review.  DHHS form attached.

## 2016-04-05 ENCOUNTER — Telehealth (HOSPITAL_BASED_OUTPATIENT_CLINIC_OR_DEPARTMENT_OTHER): Payer: Self-pay | Admitting: Emergency Medicine

## 2016-04-05 NOTE — Telephone Encounter (Signed)
Post ED Visit - Positive Culture Follow-up: Successful Patient Follow-Up  Culture assessed and recommendations reviewed by: []  Enzo BiNathan Batchelder, Pharm.D. []  Celedonio MiyamotoJeremy Frens, Pharm.D., BCPS []  Garvin FilaMike Maccia, Pharm.D. []  Georgina PillionElizabeth Martin, Pharm.D., BCPS []  SouthmaydMinh Pham, [address] BoulevardPharm.D., BCPS, AAHIVP []  Estella HuskMichelle Turner, Pharm.D., BCPS, AAHIVP []  Cassie Stewart, Pharm.D. []  Sherle Poeob Vincent, [place].  Positive GC culture  [x]  Patient discharged without antimicrobial prescription and treatment is now indicated []  Organism is resistant to prescribed ED discharge antimicrobial []  Patient with positive blood cultures  Changes discussed with ED provider: Trixie DredgeEmily West PA New antibiotic prescription start Azithromycin 1 gram po x 1  Attempted to contact pt    Berle MullMiller, [name] 04/05/2016, 12:59 PM

## 2016-04-09 ENCOUNTER — Encounter: Payer: Self-pay | Admitting: Advanced Practice Midwife

## 2016-04-09 ENCOUNTER — Other Ambulatory Visit: Payer: Self-pay | Admitting: Advanced Practice Midwife

## 2016-04-09 ENCOUNTER — Other Ambulatory Visit (HOSPITAL_COMMUNITY)
Admission: RE | Admit: 2016-04-09 | Discharge: 2016-04-09 | Disposition: A | Discharge status: Home / Self Care | Payer: Medicaid Other | Source: Ambulatory Visit | Attending: Advanced Practice Midwife | Admitting: Advanced Practice Midwife

## 2016-04-09 ENCOUNTER — Ambulatory Visit (INDEPENDENT_AMBULATORY_CARE_PROVIDER_SITE_OTHER): Payer: Self-pay | Admitting: Advanced Practice Midwife

## 2016-04-09 VITALS — BP 96/69 | HR 69 | Wt 103.7 lb

## 2016-04-09 DIAGNOSIS — Z113 Encounter for screening for infections with a predominantly sexual mode of transmission: Secondary | ICD-10-CM

## 2016-04-09 DIAGNOSIS — O98819 Other maternal infectious and parasitic diseases complicating pregnancy, unspecified trimester: Secondary | ICD-10-CM

## 2016-04-09 DIAGNOSIS — Z3480 Encounter for supervision of other normal pregnancy, unspecified trimester: Secondary | ICD-10-CM | POA: Insufficient documentation

## 2016-04-09 DIAGNOSIS — O98312 Other infections with a predominantly sexual mode of transmission complicating pregnancy, second trimester: Secondary | ICD-10-CM

## 2016-04-09 DIAGNOSIS — Z01419 Encounter for gynecological examination (general) (routine) without abnormal findings: Secondary | ICD-10-CM | POA: Insufficient documentation

## 2016-04-09 DIAGNOSIS — O0932 Supervision of pregnancy with insufficient antenatal care, second trimester: Secondary | ICD-10-CM

## 2016-04-09 DIAGNOSIS — Z3492 Encounter for supervision of normal pregnancy, unspecified, second trimester: Secondary | ICD-10-CM

## 2016-04-09 DIAGNOSIS — Z124 Encounter for screening for malignant neoplasm of cervix: Secondary | ICD-10-CM

## 2016-04-09 DIAGNOSIS — Z3689 Encounter for other specified antenatal screening: Secondary | ICD-10-CM

## 2016-04-09 DIAGNOSIS — N760 Acute vaginitis: Secondary | ICD-10-CM

## 2016-04-09 DIAGNOSIS — A749 Chlamydial infection, unspecified: Secondary | ICD-10-CM | POA: Insufficient documentation

## 2016-04-09 DIAGNOSIS — B9689 Other specified bacterial agents as the cause of diseases classified elsewhere: Secondary | ICD-10-CM | POA: Insufficient documentation

## 2016-04-09 LAB — POCT URINALYSIS DIP (DEVICE)
Bilirubin Urine: NEGATIVE
GLUCOSE, UA: NEGATIVE mg/dL
Hgb urine dipstick: NEGATIVE
KETONES UR: NEGATIVE mg/dL
Nitrite: NEGATIVE
PROTEIN: NEGATIVE mg/dL
Specific Gravity, Urine: 1.02 (ref 1.005–1.030)
UROBILINOGEN UA: 1 mg/dL (ref 0.0–1.0)
pH: 7 (ref 5.0–8.0)

## 2016-04-09 MED ORDER — AZITHROMYCIN 500 MG PO TABS: 1000.0000 mg | ORAL_TABLET | Freq: Once | ORAL | 0 refills | Status: AC

## 2016-04-09 NOTE — Patient Instructions (Signed)
Second Trimester of Pregnancy The second trimester is from week 13 through week 28, months 4 through 6. The second trimester is often a time when you feel your best. Your body has also adjusted to being pregnant, and you begin to feel better physically. Usually, morning sickness has lessened or quit completely, you may have more energy, and you may have an increase in appetite. The second trimester is also a time when the fetus is growing rapidly. At the end of the sixth month, the fetus is about 9 inches long and weighs about 1 pounds. You will likely begin to feel the baby move (quickening) between 18 and 20 weeks of the pregnancy. BODY CHANGES Your body goes through many changes during pregnancy. The changes vary from woman to woman.   Your weight will continue to increase. You will notice your lower abdomen bulging out.  You may begin to get stretch marks on your hips, abdomen, and breasts.  You may develop headaches that can be relieved by medicines approved by your health care provider.  You may urinate more often because the fetus is pressing on your bladder.  You may develop or continue to have heartburn as a result of your pregnancy.  You may develop constipation because certain hormones are causing the muscles that push waste through your intestines to slow down.  You may develop hemorrhoids or swollen, bulging veins (varicose veins).  You may have back pain because of the weight gain and pregnancy hormones relaxing your joints between the bones in your pelvis and as a result of a shift in weight and the muscles that support your balance.  Your breasts will continue to grow and be tender.  Your gums may bleed and may be sensitive to brushing and flossing.  Dark spots or blotches (chloasma, mask of pregnancy) may develop on your face. This will likely fade after the baby is born.  A dark line from your belly button to the pubic area (linea nigra) may appear. This will likely fade  after the baby is born.  You may have changes in your hair. These can include thickening of your hair, rapid growth, and changes in texture. Some women also have hair loss during or after pregnancy, or hair that feels dry or thin. Your hair will most likely return to normal after your baby is born. WHAT TO EXPECT AT YOUR PRENATAL VISITS During a routine prenatal visit:  You will be weighed to make sure you and the fetus are growing normally.  Your blood pressure will be taken.  Your abdomen will be measured to track your baby's growth.  The fetal heartbeat will be listened to.  Any test results from the previous visit will be discussed. Your health care provider may ask you:  How you are feeling.  If you are feeling the baby move.  If you have had any abnormal symptoms, such as leaking fluid, bleeding, severe headaches, or abdominal cramping.  If you are using any tobacco products, including cigarettes, chewing tobacco, and electronic cigarettes.  If you have any questions. Other tests that may be performed during your second trimester include:  Blood tests that check for:  Low iron levels (anemia).  Gestational diabetes (between 24 and 28 weeks).  Rh antibodies.  Urine tests to check for infections, diabetes, or protein in the urine.  An ultrasound to confirm the proper growth and development of the baby.  An amniocentesis to check for possible genetic problems.  Fetal screens for spina bifida   and Down syndrome.  HIV (human immunodeficiency virus) testing. Routine prenatal testing includes screening for HIV, unless you choose not to have this test. HOME CARE INSTRUCTIONS   Avoid all smoking, herbs, alcohol, and unprescribed drugs. These chemicals affect the formation and growth of the baby.  Do not use any tobacco products, including cigarettes, chewing tobacco, and electronic cigarettes. If you need help quitting, ask your health care provider. You may receive  counseling support and other resources to help you quit.  Follow your health care provider's instructions regarding medicine use. There are medicines that are either safe or unsafe to take during pregnancy.  Exercise only as directed by your health care provider. Experiencing uterine cramps is a good sign to stop exercising.  Continue to eat regular, healthy meals.  Wear a good support bra for breast tenderness.  Do not use hot tubs, steam rooms, or saunas.  Wear your seat belt at all times when driving.  Avoid raw meat, uncooked cheese, cat litter boxes, and soil used by cats. These carry germs that can cause birth defects in the baby.  Take your prenatal vitamins.  Take 1500-2000 mg of calcium daily starting at the 20th week of pregnancy until you deliver your baby.  Try taking a stool softener (if your health care provider approves) if you develop constipation. Eat more high-fiber foods, such as fresh vegetables or fruit and whole grains. Drink plenty of fluids to keep your urine clear or pale yellow.  Take warm sitz baths to soothe any pain or discomfort caused by hemorrhoids. Use hemorrhoid cream if your health care provider approves.  If you develop varicose veins, wear support hose. Elevate your feet for 15 minutes, 3-4 times a day. Limit salt in your diet.  Avoid heavy lifting, wear low heel shoes, and practice good posture.  Rest with your legs elevated if you have leg cramps or low back pain.  Visit your dentist if you have not gone yet during your pregnancy. Use a soft toothbrush to brush your teeth and be gentle when you floss.  A sexual relationship may be continued unless your health care provider directs you otherwise.  Continue to go to all your prenatal visits as directed by your health care provider. SEEK MEDICAL CARE IF:   You have dizziness.  You have mild pelvic cramps, pelvic pressure, or nagging pain in the abdominal area.  You have persistent nausea,  vomiting, or diarrhea.  You have a bad smelling vaginal discharge.  You have pain with urination. SEEK IMMEDIATE MEDICAL CARE IF:   You have a fever.  You are leaking fluid from your vagina.  You have spotting or bleeding from your vagina.  You have severe abdominal cramping or pain.  You have rapid weight gain or loss.  You have shortness of breath with chest pain.  You notice sudden or extreme swelling of your face, hands, ankles, feet, or legs.  You have not felt your baby move in over an hour.  You have severe headaches that do not go away with medicine.  You have vision changes.   This information is not intended to replace advice given to you by your health care provider. Make sure you discuss any questions you have with your health care provider.   Document Released: 07/23/2001 Document Revised: 08/19/2014 Document Reviewed: 09/29/2012 Elsevier Interactive Patient Education 2016 [employer].  Expedited Partner Therapy:  Information Sheet for Patients and Partners  You have been offered expedited partner therapy (EPT). This information sheet contains important information and warnings you need to be aware of, so please read it carefully.   Expedited Partner Therapy (EPT) is the clinical practice of treating the sexual partners of persons who receive chlamydia, gonorrhea, or trichomoniasis diagnoses by providing medications or prescriptions to the patient. Patients then provide partners with these therapies without the health-care provider having examined the partner. In other words, EPT is a convenient, fast and private way for patients to help their sexual partners get treated.   Chlamydia and gonorrhea are bacterial infections you get from having sex with a person who is already infected. Trichomoniasis (or "trich") is a very common sexually transmitted infection (STI) that is caused by infection with a protozoan parasite called Trichomonas vaginalis.   Many people with these infections don't know it because they feel fine, but without treatment these infections can cause serious health problems, such as pelvic inflammatory disease, ectopic pregnancy, infertility and increased risk of HIV.   It is important to get treated as soon as possible to protect your health, to avoid spreading these infections to others, and to prevent yourself from becoming re-infected. The good news is these infections can be easily cured with proper antibiotic medicine. The best way to take care of your self is to see a doctor or go to your local health department. If you are not able to see a doctor or other medical provider, you should take EPT.    Recommended Medication: EPT for Chlamydia:  Azithromycin (Zithromax) 1 gram orally in a single dose EPT for Gonorrhea:  Cefixime (Suprax) 400 milligrams orally in a single dose PLUS azithromycin (Zithromax) 1 gram orally in a single dose EPT for Trichomoniasis:  Metronidazole (Flagyl) 2 grams orally in a single dose   These medicines are very safe. However, you should not take them if you have ever had an allergic reaction (like a rash) to any of these medicines: azithromycin (Zithromax), erythromycin, clarithromycin (Biaxin), metronidazole (Flagyl), tinidazole (Tindimax). If you are uncertain about whether you have an allergy, call your medical provider or pharmacist before taking this medicine. If you have a serious, long-term illness like kidney, liver or heart disease, colitis or stomach problems, or you are currently taking other prescription medication, talk to your provider before taking this medication.   Women: If you have lower belly pain, pain during sex, vomiting, or a fever, do not take this medicine. Instead, you should see a medical provider to be certain you do not have pelvic inflammatory disease (PID). PID can be serious and lead to infertility, pregnancy problems or chronic pelvic pain.   Pregnant Women: It  is very important for you to see a doctor to get pregnancy services and pre-natal care. These antibiotics for EPT are safe for pregnant women, but you still need to see a medical provider as soon as possible. It is also important to note that Doxycycline is an alternative therapy for chlamydia, but it should not be taken by someone who is pregnant.   Men: If you have pain or swelling in the testicles or a fever, do not take this medicine and see a medical provider.     Men who have sex with men (MSM): MSM in [place] continue to experience high rates of syphilis and HIV. Many MSM with gonorrhea or chlamydia could also have syphilis and/or HIV and not know it. If you are a man who has sex with other men, it  is very important that you see a medical provider and are tested for HIV and syphilis. EPT is not recommended for gonorrhea for MSM.  Recommended treatment for gonorrhea for MSM is Rocephin (shot) AND azithromycin due to decreased cure rate.  Please see your medical provider if this is the case.    Along with this information sheet is a prescription for the medicine. If you receive a prescription it will be in your name and will indicate your date of birth, or it will be in the name of "Expedited Partner Therapy".   In either case, you can have the prescription filled at a pharmacy. You will be responsible for the cost of the medicine, unless you have prescription drug coverage. In that case, you could provide your name so the pharmacy could bill your health plan.   Take the medication as directed. Some people will have a mild, upset stomach, which does not last long. AVOID alcohol 24 hours after taking metronidazole (Flagyl) to reduce the possibility of a disulfiram-like reaction (severe vomiting and abdominal pain).  After taking the medicine, do not have sex for 7 days. Do not share this medicine or give it to anyone else. It is important to tell everyone you have had sex with in the last 60 days  that they need to go and get tested for sexually transmitted infections.   Ways to prevent these and other sexually transmitted infections (STIs):   Marland Kitchen. Abstain from sex. This is the only sure way to avoid getting an STI.  Marland Kitchen. Use barrier methods, such as condoms, consistently and correctly.  . Limit the number of sexual partners.  . Have regular physical exams, including testing for STIs.   For more information about EPT or other issues pertaining to an STI, please contact your medical provider or the [facility] at [phone] or [email or web address]    Chlamydia, Female Chlamydia is an infection. It is spread through sexual contact. Chlamydia can be in different areas of the body. These areas include the cervix, urethra, throat, or rectum. You may not know you have chlamydia because many people never develop the symptoms. Chlamydia is not difficult to treat once you know you have it. However, if it is left untreated, chlamydia can lead to more serious health problems.  CAUSES  Chlamydia is caused by bacteria. It is a sexually transmitted disease. It is passed from an infected partner during intimate contact. This contact could be with the genitals, mouth, or rectal area. Chlamydia can also be passed from mothers to babies during birth. SIGNS AND SYMPTOMS  There may not be any symptoms. This is often the case early in the infection. If symptoms develop, they may include:  Mild pain and discomfort when urinating.  Redness, soreness, and swelling (inflammation) of the rectum.  Vaginal discharge.  Painful intercourse.  Abdominal pain.  Bleeding between menstrual periods. DIAGNOSIS  To diagnose this infection, your health care provider will do a pelvic exam. Cultures will be taken of the vagina, cervix, urine, and possibly the rectum to verify the diagnosis.  TREATMENT You will be given antibiotic  medicines. If you are pregnant, certain types of antibiotics will need to be avoided. Any sexual partners should also be treated, even if they do not show symptoms. Your health care provider may test you for infection again 3 months after treatment. HOME CARE INSTRUCTIONS   Take your antibiotic medicine as directed by your health care provider. Finish the antibiotic even if  you start to feel better.  Take medicines only as directed by your health care provider.  Inform any sexual partners about the infection. They should also be treated.  Do not have sexual contact until your health care provider tells you it is okay.  Get plenty of rest.  Eat a well-balanced diet.  Drink enough fluids to keep your urine clear or pale yellow.  Keep all follow-up visits as directed by your health care provider. SEEK MEDICAL CARE IF:  You have painful urination.  You have abdominal pain.  You have vaginal discharge.  You have painful sexual intercourse.  You have bleeding between periods and after sex.  You have a fever. SEEK IMMEDIATE MEDICAL CARE IF:   You experience nausea or vomiting.  You experience excessive sweating (diaphoresis).  You have difficulty swallowing.   This information is not intended to replace advice given to you by your health care provider. Make sure you discuss any questions you have with your health care provider.   Document Released: 05/08/2005 Document Revised: 04/19/2015 Document Reviewed: 04/05/2013 Elsevier Interactive Patient Education [employer]

## 2016-04-09 NOTE — Progress Notes (Signed)
  Subjective:    Marcia LanJessica L Jackson is a G2P1001 7425w2d being seen today for her first obstetrical visit.  Her obstetrical history is significant for SVD with first pregnancy, Chlamydia 2nd trimester, BV. Patient does not intend to breast feed. Pregnancy history fully reviewed.  Patient reports no complaints.  Vitals:   04/09/16 0817  BP: 96/69  Pulse: 69  Weight: 47 kg (103 lb 11.2 oz)    HISTORY: OB History  Gravida Para Term Preterm AB Living  2 1 1     1   SAB TAB Ectopic Multiple Live Births          1    # Outcome Date GA Lbr Len/2nd Weight Sex Delivery Anes PTL Lv  2 Current           1 Term 09/27/09 415w0d  2.637 kg (5 lb 13 oz) M Vag-Spont EPI N LIV     Past Medical History:  Diagnosis Date  . Depression    History reviewed. No pertinent surgical history. History reviewed. No pertinent family history.   Exam    Uterus:     Pelvic Exam:    Perineum: Normal Perineum   Vulva: Bartholin's, Urethra, Skene's normal   Vagina:  normal mucosa, normal discharge   pH:    Cervix: multiparous appearance and Friable   Adnexa: no mass, fullness, tenderness   Bony Pelvis: gynecoid  System: Breast:  normal appearance, no masses or tenderness   Skin: normal coloration and turgor, no rashes    Neurologic: oriented, grossly non-focal   Extremities: normal strength, tone, and muscle mass   HEENT neck supple with midline trachea   Mouth/Teeth mucous membranes moist, pharynx normal without lesions   Neck supple and no masses   Cardiovascular: regular rate and rhythm, no murmurs or gallops   Respiratory:  appears well, vitals normal, no respiratory distress, acyanotic, normal RR, ear and throat exam is normal, neck free of mass or lymphadenopathy, chest clear, no wheezing, crepitations, rhonchi, normal symmetric air entry   Abdomen: soft, non-tender; bowel sounds normal; no masses,  no organomegaly   Urinary: urethral meatus normal      Assessment:    Pregnancy:  G2P1001 Patient Active Problem List   Diagnosis Date Noted  . Chlamydia infection affecting pregnancy 04/09/2016  . BV (bacterial vaginosis) 04/09/2016  . Late prenatal care affecting pregnancy in second trimester, antepartum 04/09/2016        Plan:     Initial labs drawn. Prenatal vitamins. Problem list reviewed and updated. Genetic Screening discussed Quad Screen: ordered.  Ultrasound discussed; fetal survey: ordered.  Follow up in 4 weeks. 50% of 30 min visit spent on counseling and coordination of care.   Routines reviewed, welcomed to practice Discussed positive Chlamydia, Rx sent to pharmacy [facility] provided, info provided Discussed + BV, Flagyl Rx sent to pharmacy    [facility]Sharbel Sahagun 04/09/2016

## 2016-04-09 NOTE — Progress Notes (Signed)
New ob packet given Anatomy US scheduled for September 7th @ 1345.  Pt notified.  Home Medicaid Form Completed

## 2016-04-10 LAB — AFP, QUAD SCREEN
AFP: 126.3 ng/mL
Age Alone: 1:1070 {titer}
CURR GEST AGE: 21.3 wk
HCG TOTAL: 32.01 [IU]/mL
INH: 350.3 pg/mL
Interpretation-AFP: NEGATIVE
MOM FOR HCG: 1.31
MoM for AFP: 1.54
MoM for INH: 1.18
Open Spina bifida: NEGATIVE
Tri 18 Scr Risk Est: NEGATIVE
UE3 VALUE: 2.07 ng/mL
uE3 Mom: 0.9

## 2016-04-10 LAB — CYTOLOGY - PAP

## 2016-04-10 LAB — GC/CHLAMYDIA PROBE AMP (~~LOC~~) NOT AT ARMC
CHLAMYDIA, DNA PROBE: POSITIVE — AB
NEISSERIA GONORRHEA: NEGATIVE

## 2016-04-10 LAB — CULTURE, OB URINE: Organism ID, Bacteria: NO GROWTH

## 2016-04-11 ENCOUNTER — Telehealth: Payer: Self-pay | Admitting: *Deleted

## 2016-04-11 DIAGNOSIS — A749 Chlamydial infection, unspecified: Secondary | ICD-10-CM

## 2016-04-11 LAB — PAIN MGMT, PROFILE 6 CONF W/O MM, U
6 ACETYLMORPHINE: NEGATIVE ng/mL (ref <10)
AMPHETAMINES: NEGATIVE ng/mL (ref <500)
Alcohol Metabolites: NEGATIVE ng/mL (ref <500)
BENZODIAZEPINES: NEGATIVE ng/mL (ref <100)
Barbiturates: NEGATIVE ng/mL (ref <300)
CREATININE: 120.2 mg/dL (ref >=20.0)
Cocaine Metabolite: NEGATIVE ng/mL (ref <150)
MARIJUANA METABOLITE: NEGATIVE ng/mL (ref <20)
Methadone Metabolite: NEGATIVE ng/mL (ref <100)
OPIATES: NEGATIVE ng/mL (ref <100)
OXIDANT: NEGATIVE ug/mL (ref <200)
OXYCODONE: NEGATIVE ng/mL (ref <100)
PH: 6.43 (ref 4.5–9.0)
PHENCYCLIDINE: NEGATIVE ng/mL (ref <25)
Please note:: 0

## 2016-04-11 MED ORDER — AZITHROMYCIN 250 MG PO TABS: 1000.0000 mg | ORAL_TABLET | Freq: Once | ORAL | 0 refills | Status: AC

## 2016-04-11 NOTE — Telephone Encounter (Signed)
Pt has chlamydia. Rx to pharmacy. Spoke with patient she was already aware of the diagnosis and has been treated as well as her partner.

## 2016-04-18 ENCOUNTER — Ambulatory Visit (HOSPITAL_COMMUNITY)
Admission: RE | Admit: 2016-04-18 | Discharge: 2016-04-18 | Disposition: A | Discharge status: Home / Self Care | Payer: Medicaid Other | Source: Ambulatory Visit | Attending: Advanced Practice Midwife | Admitting: Advanced Practice Midwife

## 2016-04-18 ENCOUNTER — Other Ambulatory Visit: Payer: Self-pay | Admitting: Advanced Practice Midwife

## 2016-04-18 DIAGNOSIS — Z3A22 22 weeks gestation of pregnancy: Secondary | ICD-10-CM | POA: Diagnosis not present

## 2016-04-18 DIAGNOSIS — Z36 Encounter for antenatal screening of mother: Secondary | ICD-10-CM | POA: Diagnosis not present

## 2016-04-18 DIAGNOSIS — O09292 Supervision of pregnancy with other poor reproductive or obstetric history, second trimester: Secondary | ICD-10-CM | POA: Insufficient documentation

## 2016-04-18 DIAGNOSIS — Z3492 Encounter for supervision of normal pregnancy, unspecified, second trimester: Secondary | ICD-10-CM

## 2016-04-18 DIAGNOSIS — Z3689 Encounter for other specified antenatal screening: Secondary | ICD-10-CM

## 2016-04-22 ENCOUNTER — Other Ambulatory Visit (HOSPITAL_COMMUNITY): Payer: Self-pay | Admitting: *Deleted

## 2016-04-22 ENCOUNTER — Inpatient Hospital Stay (HOSPITAL_COMMUNITY)
Admission: AD | Admit: 2016-04-22 | Discharge: 2016-04-22 | Disposition: A | Discharge status: Home / Self Care | Payer: Medicaid Other | Source: Ambulatory Visit | Attending: Family Medicine | Admitting: Family Medicine

## 2016-04-22 DIAGNOSIS — F17210 Nicotine dependence, cigarettes, uncomplicated: Secondary | ICD-10-CM | POA: Insufficient documentation

## 2016-04-22 DIAGNOSIS — O26892 Other specified pregnancy related conditions, second trimester: Secondary | ICD-10-CM | POA: Diagnosis not present

## 2016-04-22 DIAGNOSIS — Z3A23 23 weeks gestation of pregnancy: Secondary | ICD-10-CM | POA: Diagnosis not present

## 2016-04-22 DIAGNOSIS — O99332 Smoking (tobacco) complicating pregnancy, second trimester: Secondary | ICD-10-CM | POA: Insufficient documentation

## 2016-04-22 DIAGNOSIS — J029 Acute pharyngitis, unspecified: Secondary | ICD-10-CM | POA: Diagnosis present

## 2016-04-22 DIAGNOSIS — J069 Acute upper respiratory infection, unspecified: Secondary | ICD-10-CM

## 2016-04-22 LAB — RAPID STREP SCREEN (MED CTR MEBANE ONLY): STREPTOCOCCUS, GROUP A SCREEN (DIRECT): NEGATIVE

## 2016-04-22 NOTE — Discharge Instructions (Signed)
Pharyngitis Pharyngitis is redness, pain, and swelling (inflammation) of your pharynx.  CAUSES  Pharyngitis is usually caused by infection. Most of the time, these infections are from viruses (viral) and are part of a cold. However, sometimes pharyngitis is caused by bacteria (bacterial). Pharyngitis can also be caused by allergies. Viral pharyngitis may be spread from person to person by coughing, sneezing, and personal items or utensils (cups, forks, spoons, toothbrushes). Bacterial pharyngitis may be spread from person to person by more intimate contact, such as kissing.  SIGNS AND SYMPTOMS  Symptoms of pharyngitis include:   Sore throat.   Tiredness (fatigue).   Low-grade fever.   Headache.  Joint pain and muscle aches.  Skin rashes.  Swollen lymph nodes.  Plaque-like film on throat or tonsils (often seen with bacterial pharyngitis). DIAGNOSIS  Your health care provider will ask you questions about your illness and your symptoms. Your medical history, along with a physical exam, is often all that is needed to diagnose pharyngitis. Sometimes, a rapid strep test is done. Other lab tests may also be done, depending on the suspected cause.  TREATMENT  Viral pharyngitis will usually get better in 3-4 days without the use of medicine. Bacterial pharyngitis is treated with medicines that kill germs (antibiotics).  HOME CARE INSTRUCTIONS   Drink enough water and fluids to keep your urine clear or pale yellow.   Only take over-the-counter or prescription medicines as directed by your health care provider:   If you are prescribed antibiotics, make sure you finish them even if you start to feel better.   Do not take aspirin.   Get lots of rest.   Gargle with 8 oz of salt water ( tsp of salt per 1 qt of water) as often as every 1-2 hours to soothe your throat.   Throat lozenges (if you are not at risk for choking) or sprays may be used to soothe your throat. SEEK MEDICAL  CARE IF:   You have large, tender lumps in your neck.  You have a rash.  You cough up green, yellow-brown, or bloody spit. SEEK IMMEDIATE MEDICAL CARE IF:   Your neck becomes stiff.  You drool or are unable to swallow liquids.  You vomit or are unable to keep medicines or liquids down.  You have severe pain that does not go away with the use of recommended medicines.  You have trouble breathing (not caused by a stuffy nose). MAKE SURE YOU:   Understand these instructions.  Will watch your condition.  Will get help right away if you are not doing well or get worse.   This information is not intended to replace advice given to you by your health care provider. Make sure you discuss any questions you have with your health care provider.   Document Released: 07/29/2005 Document Revised: 05/19/2013 Document Reviewed: 04/05/2013 Elsevier Interactive Patient Education 2016 [employer]     Safe Medications in Pregnancy   Acne: Benzoyl Peroxide Salicylic Acid  Backache/Headache: Tylenol: 2 regular strength every 4 hours OR              2 Extra strength every 6 hours  Colds/Coughs/Allergies: Benadryl (alcohol free) 25 mg every 6 hours as needed Breath right strips Claritin Cepacol throat lozenges Chloraseptic throat spray Cold-Eeze- up to three times per day Cough drops, alcohol free Flonase (by prescription only) Guaifenesin Mucinex Robitussin DM (plain only, alcohol free) Saline nasal spray/drops Sudafed (pseudoephedrine) & Actifed ** use only after [redacted] weeks gestation and if  you do not have high blood pressure Tylenol Vicks Vaporub Zinc lozenges Zyrtec   Constipation: Colace Ducolax suppositories Fleet enema Glycerin suppositories Metamucil Milk of magnesia Miralax Senokot Smooth move tea  Diarrhea: Kaopectate Imodium A-D  *NO pepto Bismol  Hemorrhoids: Anusol Anusol HC Preparation H Tucks  Indigestion: Tums Maalox Mylanta Zantac    Pepcid  Insomnia: Benadryl (alcohol free) 25mg  every 6 hours as needed Tylenol PM Unisom, no Gelcaps  Leg Cramps: Tums MagGel  Nausea/Vomiting:  Bonine Dramamine Emetrol Ginger extract Sea bands Meclizine  Nausea medication to take during pregnancy:  Unisom (doxylamine succinate 25 mg tablets) Take one tablet daily at bedtime. If symptoms are not adequately controlled, the dose can be increased to a maximum recommended dose of two tablets daily (1/2 tablet in the morning, 1/2 tablet mid-afternoon and one at bedtime). Vitamin B6 100mg  tablets. Take one tablet twice a day (up to 200 mg per day).  Skin Rashes: Aveeno products Benadryl cream or 25mg  every 6 hours as needed Calamine Lotion 1% cortisone cream  Yeast infection: Gyne-lotrimin 7 Monistat 7  Gum/tooth pain: Anbesol  **If taking multiple medications, please check labels to avoid duplicating the same active ingredients **take medication as directed on the label ** Do not exceed 4000 mg of tylenol in 24 hours **Do not take medications that contain aspirin or ibuprofen

## 2016-04-22 NOTE — MAU Note (Signed)
Pt C/O sore throat - woke up this morning with it, non-productive cough.  Denies fever.  Denies abd pain, bleeding or discharge.

## 2016-04-22 NOTE — MAU Provider Note (Signed)
History     CSN: [id number]  Arrival date and time: 04/22/16 1542   None     Chief Complaint  Patient presents with  . Sore Throat   Marcia Jackson is a 25 y.o. G2P1001 at 964w1d who presents with sore throat. Sick contacts currently include step children who have a cold. Symptoms began this morning. Denies any OB complaints.    Sore Throat   This is a new problem. The current episode started today. The problem has been unchanged. There has been no fever. The pain is at a severity of 7/10. Associated symptoms include coughing. Pertinent negatives include no abdominal pain, congestion, diarrhea, drooling, ear pain, headaches, hoarse voice, shortness of breath, swollen glands, trouble swallowing or vomiting. She has had no exposure to strep or mono. She has tried nothing for the symptoms.    OB History    Gravida Para Term Preterm AB Living   2 1 1     1    SAB TAB Ectopic Multiple Live Births           1      Past Medical History:  Diagnosis Date  . Depression     No past surgical history on file.  No family history on file.  Social History  Substance Use Topics  . Smoking status: Current Every Day Smoker    Packs/day: 0.50    Types: Cigarettes  . Smokeless tobacco: Never Used  . Alcohol use Yes     Comment: occasionally    Allergies: No Known Allergies  Prescriptions Prior to Admission  Medication Sig Dispense Refill Last Dose  . acetaminophen (TYLENOL) 500 MG tablet Take 1,000 mg by mouth every 6 (six) hours as needed for mild pain.   Not Taking  . benzonatate (TESSALON) 100 MG capsule Take 1 capsule (100 mg total) by mouth every 8 (eight) hours. (Patient not taking: Reported on 04/09/2016) 21 capsule 0 Not Taking  . HYDROcodone-acetaminophen (HYCET) 7.5-325 mg/15 ml solution Take 15 mLs by mouth every 8 (eight) hours as needed for moderate pain (Do not drive after taking). (Patient not taking: Reported on 04/09/2016) 120 mL 0 Not Taking  . ibuprofen (ADVIL,MOTRIN)  600 MG tablet Take 1 tablet (600 mg total) by mouth every 6 (six) hours as needed. (Patient not taking: Reported on 04/09/2016) 30 tablet 0 Not Taking  . ibuprofen (ADVIL,MOTRIN) 800 MG tablet Take 1 tablet (800 mg total) by mouth 3 (three) times daily. (Patient not taking: Reported on 04/09/2016) 21 tablet 0 Not Taking  . medroxyPROGESTERone (DEPO-PROVERA) 150 MG/ML injection Inject 150 mg into the muscle every 3 (three) months.   Not Taking  . Naproxen Sodium 220 MG CAPS Take 220 mg by mouth daily as needed (pain).   Not Taking  . ondansetron (ZOFRAN ODT) 4 MG disintegrating tablet Take 1 tablet (4 mg total) by mouth every 8 (eight) hours as needed for nausea or vomiting. (Patient not taking: Reported on 04/09/2016) 20 tablet 0 Not Taking  . ondansetron (ZOFRAN) 4 MG tablet Take 1 tablet (4 mg total) by mouth every 8 (eight) hours as needed for nausea or vomiting. (Patient not taking: Reported on 04/09/2016) 20 tablet 0 Not Taking    Review of Systems  Constitutional: Negative for chills and fever.  HENT: Positive for sore throat. Negative for congestion, drooling, ear pain, hoarse voice and trouble swallowing.   Respiratory: Positive for cough. Negative for sputum production and shortness of breath.   Gastrointestinal: Negative for abdominal pain,  diarrhea and vomiting.  Genitourinary: Negative.   Neurological: Negative for headaches.   Physical Exam   Blood pressure (!) 97/52, pulse 68, temperature 98.2 F (36.8 C), temperature source Oral, resp. rate 18, last menstrual period 01/11/2016.  Physical Exam  Nursing note and vitals reviewed. Constitutional: She is oriented to person, place, and time. She appears well-developed and well-nourished. No distress.  HENT:  Head: Normocephalic and atraumatic.  Right Ear: Tympanic membrane normal.  Left Ear: Tympanic membrane normal.  Nose: Nose normal.  Mouth/Throat: Posterior oropharyngeal erythema present. No oropharyngeal exudate, posterior  oropharyngeal edema or tonsillar abscesses.  Eyes: Conjunctivae are normal. Right eye exhibits no discharge. Left eye exhibits no discharge. No scleral icterus.  Neck: Normal range of motion.  Cardiovascular: Normal rate, regular rhythm and normal heart sounds.   No murmur heard. Respiratory: Effort normal and breath sounds normal. No respiratory distress. She has no wheezes.  Lymphadenopathy:       Head (right side): Submandibular adenopathy present.       Head (left side): Submandibular adenopathy present.  Neurological: She is alert and oriented to person, place, and time.  Skin: Skin is warm and dry. She is not diaphoretic.  Psychiatric: She has a normal mood and affect. Her behavior is normal. Judgment and thought content normal.    MAU Course  Procedures No results found for this or any previous visit (from the past 24 hour(s)).  MDM FHT 155 by doppler Strep swab collected VSS  Assessment and Plan  A: 1. Acute pharyngitis, unspecified pharyngitis type   2. Acute upper respiratory infection     P: Discharge home Discussed symptomatic tx using otc meds Strep swab pending Discussed reasons to return to MAU Keep f/u with OB   Judeth Horn 04/22/2016, 4:38 PM

## 2016-04-25 LAB — CULTURE, GROUP A STREP (THRC)

## 2016-04-29 ENCOUNTER — Encounter (HOSPITAL_COMMUNITY): Payer: Self-pay

## 2016-04-29 ENCOUNTER — Inpatient Hospital Stay (HOSPITAL_COMMUNITY)
Admission: AD | Admit: 2016-04-29 | Discharge: 2016-04-29 | Disposition: A | Discharge status: Home / Self Care | Payer: Medicaid Other | Source: Ambulatory Visit | Attending: Family Medicine | Admitting: Family Medicine

## 2016-04-29 DIAGNOSIS — O99332 Smoking (tobacco) complicating pregnancy, second trimester: Secondary | ICD-10-CM | POA: Diagnosis not present

## 2016-04-29 DIAGNOSIS — A749 Chlamydial infection, unspecified: Secondary | ICD-10-CM

## 2016-04-29 DIAGNOSIS — O23592 Infection of other part of genital tract in pregnancy, second trimester: Secondary | ICD-10-CM | POA: Diagnosis not present

## 2016-04-29 DIAGNOSIS — O98819 Other maternal infectious and parasitic diseases complicating pregnancy, unspecified trimester: Secondary | ICD-10-CM

## 2016-04-29 DIAGNOSIS — O98319 Other infections with a predominantly sexual mode of transmission complicating pregnancy, unspecified trimester: Secondary | ICD-10-CM

## 2016-04-29 DIAGNOSIS — F17210 Nicotine dependence, cigarettes, uncomplicated: Secondary | ICD-10-CM | POA: Diagnosis not present

## 2016-04-29 DIAGNOSIS — B9689 Other specified bacterial agents as the cause of diseases classified elsewhere: Secondary | ICD-10-CM | POA: Insufficient documentation

## 2016-04-29 DIAGNOSIS — R102 Pelvic and perineal pain: Secondary | ICD-10-CM | POA: Diagnosis present

## 2016-04-29 DIAGNOSIS — N760 Acute vaginitis: Secondary | ICD-10-CM | POA: Insufficient documentation

## 2016-04-29 DIAGNOSIS — Z3A21 21 weeks gestation of pregnancy: Secondary | ICD-10-CM | POA: Diagnosis not present

## 2016-04-29 DIAGNOSIS — O26892 Other specified pregnancy related conditions, second trimester: Secondary | ICD-10-CM | POA: Diagnosis present

## 2016-04-29 DIAGNOSIS — O0932 Supervision of pregnancy with insufficient antenatal care, second trimester: Secondary | ICD-10-CM

## 2016-04-29 LAB — URINALYSIS, ROUTINE W REFLEX MICROSCOPIC
Bilirubin Urine: NEGATIVE
GLUCOSE, UA: NEGATIVE mg/dL
Ketones, ur: NEGATIVE mg/dL
Nitrite: NEGATIVE
PH: 7 (ref 5.0–8.0)
Protein, ur: NEGATIVE mg/dL
SPECIFIC GRAVITY, URINE: 1.02 (ref 1.005–1.030)

## 2016-04-29 LAB — WET PREP, GENITAL
Sperm: NONE SEEN
TRICH WET PREP: NONE SEEN
YEAST WET PREP: NONE SEEN

## 2016-04-29 LAB — URINE MICROSCOPIC-ADD ON

## 2016-04-29 MED ORDER — METRONIDAZOLE 500 MG PO TABS: 500.0000 mg | ORAL_TABLET | Freq: Two times a day (BID) | ORAL | 0 refills | Status: AC

## 2016-04-29 NOTE — MAU Provider Note (Signed)
History     CSN: [id number]  Arrival date and time: 04/29/16 0946   None     Chief Complaint  Patient presents with  . vaginal pressure   Marcia Jackson is a 25 y.o. G2P1001 at 6051w1d who presents with lower abdominal cramping and pressure since 7am this morning. Feels lots of pressure down in pelvic region, rates 7 out of 10, feels similar to labor but comparatively milder in intensity.  Has had some "wetness" with damp underwear twice today. Vaginal discharge x3 days but denies itching or burning or foul odor. Sexual activity 2 days ago. Has not really been drinking water lately.     OB History    Gravida Para Term Preterm AB Living   2 1 1     1    SAB TAB Ectopic Multiple Live Births           1      Past Medical History:  Diagnosis Date  . Depression     Past Surgical History:  Procedure Laterality Date  . NO PAST SURGERIES      Family History  Problem Relation Age of Onset  . Diabetes Maternal Aunt   . Cancer Maternal Grandmother   . Diabetes Maternal Grandmother     Social History  Substance Use Topics  . Smoking status: Current Every Day Smoker    Packs/day: 0.50    Types: Cigarettes  . Smokeless tobacco: Never Used  . Alcohol use Yes     Comment: occasionally    Allergies: No Known Allergies  Prescriptions Prior to Admission  Medication Sig Dispense Refill Last Dose  . acetaminophen (TYLENOL) 500 MG tablet Take 1,000 mg by mouth every 6 (six) hours as needed for mild pain.   Past Week at Unknown time  . Prenatal Vit-Fe Fumarate-FA (PRENATAL MULTIVITAMIN) TABS tablet Take 1 tablet by mouth daily at 12 noon.   04/29/2016 at Unknown time    Review of Systems  Constitutional: Negative for chills and fever.  Eyes: Negative for blurred vision, double vision and photophobia.  Respiratory: Negative for shortness of breath.   Cardiovascular: Negative for chest pain.  Gastrointestinal: Positive for abdominal pain (b/l lower). Negative for constipation,  diarrhea, nausea and vomiting.  Genitourinary: Negative for dysuria, frequency and urgency.  Neurological: Negative for headaches.   Physical Exam   Blood pressure 107/66, pulse 78, temperature 98.1 F (36.7 C), temperature source Oral, resp. rate 18, height 5\' 3"  (1.6 m), weight 48.1 kg (106 lb), last menstrual period 01/11/2016.  Physical Exam  Constitutional: She is oriented to person, place, and time. She appears well-developed and well-nourished. No distress.  HENT:  Head: Normocephalic and atraumatic.  Cardiovascular: Normal rate, regular rhythm and normal heart sounds.   No murmur heard. Respiratory: Effort normal and breath sounds normal. No respiratory distress. She has no wheezes.  GI: Soft. Bowel sounds are normal. There is tenderness (b/l lower over round ligaments). There is no rebound and no guarding.  Genitourinary: Vaginal discharge found.  Neurological: She is alert and oriented to person, place, and time.  Skin: Skin is warm and dry.  Psychiatric: She has a normal mood and affect.   Cervical exam: closed/thick/high  MAU Course  Procedures  MDM PO hydration Wet mount showed clue cells  Urinalysis showed few bacteria and small leukocytes but negative for nitrites GC/chlamydia sent  Assessment and Plan  IUP@21 .1 Bacterial vaginosis - flagyl - given return precautions  Leland HerElsia J Yoo PGY-1 04/29/2016, 10:55 AM  OB FELLOW DISCHARGE ATTESTATION  I have seen and examined this patient and agree with above documentation in the resident's note.   Ernestina Penna, MD 12:51 PM

## 2016-04-29 NOTE — Discharge Instructions (Signed)

## 2016-04-29 NOTE — MAU Note (Signed)
Pt states she went to work this morning and started feeling intense vaginal pressure. Pt states the pressure comes and goes but is very severe. Pt denies bleeding and leaking of fluid. Pt states baby is moving normally.

## 2016-04-30 LAB — GC/CHLAMYDIA PROBE AMP (~~LOC~~) NOT AT ARMC
Chlamydia: NEGATIVE
NEISSERIA GONORRHEA: NEGATIVE

## 2016-05-07 ENCOUNTER — Ambulatory Visit (INDEPENDENT_AMBULATORY_CARE_PROVIDER_SITE_OTHER): Payer: Self-pay | Admitting: Advanced Practice Midwife

## 2016-05-07 VITALS — BP 99/57 | HR 72 | Wt 109.5 lb

## 2016-05-07 DIAGNOSIS — Z8279 Family history of other congenital malformations, deformations and chromosomal abnormalities: Secondary | ICD-10-CM | POA: Insufficient documentation

## 2016-05-07 DIAGNOSIS — O09899 Supervision of other high risk pregnancies, unspecified trimester: Secondary | ICD-10-CM

## 2016-05-07 DIAGNOSIS — Z283 Underimmunization status: Secondary | ICD-10-CM

## 2016-05-07 DIAGNOSIS — O0932 Supervision of pregnancy with insufficient antenatal care, second trimester: Secondary | ICD-10-CM

## 2016-05-07 DIAGNOSIS — O9989 Other specified diseases and conditions complicating pregnancy, childbirth and the puerperium: Secondary | ICD-10-CM

## 2016-05-07 NOTE — Patient Instructions (Addendum)
Pregnancy and Influenza Influenza, also called the flu, is an infection of the respiratory tract. If you are pregnant, you are more likely to catch the flu. You are also more likely to have a more serious case of the flu. This is because pregnancy lowers your body's ability to fight off infections (it weakens your immune system). It also puts additional stress on your heart and lungs, which makes you more likely to have complications. Having a bad case of the flu, especially with a high fever, can be dangerous for your developing baby. It can cause you to go into early labor. HOW DO PEOPLE GET THE FLU? The flu is caused by the influenza virus. This virus is common every year in the fall and winter. It spreads when virus particles get passed from person to person. You can get the virus if you are near a sick person who is coughing or sneezing. You can also get the virus if you touch something that has the virus on it and then touch your face. HOW CAN I PROTECT MYSELF AGAINST THE FLU?  Get a flu shot. The best way to prevent the flu is to get a flu shot before flu season starts. The flu shot is not dangerous for your developing baby. It may even help protect your baby from the flu for up to 6 months after birth. The flu shot is one type of flu vaccine. Another type is a nasal spray vaccine. Do not get the nasal spray vaccine. It is not approved for pregnancy.  Do not come in close contact with sick people.  Do not share food, drinks, or utensils with other people.  Wash your hands often. Use hand sanitizer when soap and water are not available. WHAT SHOULD I DO IF I HAVE FLU SYMPTOMS? If you have any flu symptoms, call your health care provider right away. Flu symptoms include:  Fever or chills.  Muscle aches.  Headache.  Sore throat.  Nasal congestion.  Cough.  Feeling tired.  Loss of appetite.  Vomiting.  Diarrhea. You may be able to take an antiviral medicine to keep the flu from  becoming severe and to shorten how long it lasts. WHAT SHOULD I DO AT HOME IF I AM DIAGNOSED WITH THE FLU?  Do not take any medicine, including cold or flu medicine, unless directed by your health care provider.  If you take antiviral medicine, make sure you finish it even if you start to feel better.  Drink enough fluid to keep your urine clear or pale yellow.  Get plenty of rest. WHEN WOULD I SEEK IMMEDIATE MEDICAL CARE IF I HAVE THE FLU?  You have trouble breathing.  You have chest pain.  You begin to have labor pains.  You have a high fever that does not go down after you take medicine.  You do not feel your baby move.  You have diarrhea or vomiting that will not go away.   This information is not intended to replace advice given to you by your health care provider. Make sure you discuss any questions you have with your health care provider.   Document Released: 05/31/2008 Document Revised: 08/03/2013 Document Reviewed: 06/25/2013 Elsevier Interactive Patient Education 2016 Elsevier Inc.   Glucose Tolerance Test During Pregnancy The glucose tolerance test (GTT) is a blood test used to determine if you have developed a type of diabetes during pregnancy (gestational diabetes). This is when your body does not properly process sugar (glucose) in the food  you eat, resulting in high blood glucose levels. Typically, a GTT is done after you have had a 1-hour glucose test with results that indicate you possibly have gestational diabetes. It may also be done if:  You have a history of giving birth to very large babies or have experienced repeated fetal loss (stillbirth).   You have signs and symptoms of diabetes, such as:   Changes in your vision.   Tingling or numbness in your hands or feet.   Changes in hunger, thirst, and urination not otherwise explained by your pregnancy.  The GTT lasts about 3 hours. You will be given a sugar-water solution to drink at the beginning of  the test. You will have blood drawn before you drink the solution and then again 1, 2, and 3 hours after you drink it. You will not be allowed to eat or drink anything else during the test. You must remain at the testing location to make sure that your blood is drawn on time. You should also avoid exercising during the test, because exercise can alter test results. PREPARATION FOR TEST  Eat normally for 3 days prior to the GTT test, including having plenty of carbohydrate-rich foods. Do not eat or drink anything except water during the final 12 hours before the test. In addition, your health care provider may ask you to stop taking certain medicines before the test. RESULTS  It is your responsibility to obtain your test results. Ask the lab or department performing the test when and how you will get your results. Contact your health care provider to discuss any questions you have about your results.  Range of Normal Values Ranges for normal values may vary among different labs and hospitals. You should always check with your health care provider after having lab work or other tests done to discuss whether your values are considered within normal limits. Normal levels of blood glucose are as follows:  Fasting: less than 105 mg/dL.   1 hour after drinking the solution: less than 190 mg/dL.   2 hours after drinking the solution: less than 165 mg/dL.   3 hours after drinking the solution: less than 145 mg/dL.  Some substances can interfere with GTT results. These may include:  Blood pressure and heart failure medicines, including beta blockers, furosemide, and thiazides.   Anti-inflammatory medicines, including aspirin.   Nicotine.   Some psychiatric medicines.  Meaning of Results Outside Normal Value Ranges GTT test results that are above normal values may indicate a number of health problems, such as:   Gestational diabetes.   Acute stress response.   Cushing syndrome.    Tumors such as pheochromocytoma or glucagonoma.   Long-term kidney problems.   Pancreatitis.   Hyperthyroidism.   Current infection.  Discuss your test results with your health care provider. He or she will use the results to make a diagnosis and determine a treatment plan that is right for you.   This information is not intended to replace advice given to you by your health care provider. Make sure you discuss any questions you have with your health care provider.   Document Released: 01/28/2012 Document Revised: 08/19/2014 Document Reviewed: 12/03/2013 Elsevier Interactive Patient Education [employer]2016 Elsevier Inc.

## 2016-05-07 NOTE — Progress Notes (Signed)
   PRENATAL VISIT NOTE  Subjective:  Marcia Jackson is a 25 y.o. G2P1001 at 608w2d being seen today for ongoing prenatal care.  She is currently monitored for the following issues for this low-risk pregnancy and has Chlamydia infection affecting pregnancy; BV (bacterial vaginosis); Late prenatal care affecting pregnancy in second trimester, antepartum; and Family history of congenital heart defect on her problem list.  Patient reports no complaints.  Contractions: Not present.  .  Movement: Present. Denies leaking of fluid.   The following portions of the patient's history were reviewed and updated as appropriate: allergies, current medications, past family history, past medical history, past social history, past surgical history and problem list. Problem list updated.  Objective:   Vitals:   05/07/16 1258  BP: (!) 99/57  Pulse: 72  Weight: 109 lb 8 oz (49.7 kg)    Fetal Status: Fetal Heart Rate (bpm): 154 Fundal Height: 25 cm Movement: Present     General:  Alert, oriented and cooperative. Patient is in no acute distress.  Skin: Skin is warm and dry. No rash noted.   Cardiovascular: Normal heart rate noted  Respiratory: Normal respiratory effort, no problems with respiration noted  Abdomen: Soft, gravid, appropriate for gestational age. Pain/Pressure: Present     Pelvic:  Cervical exam deferred        Extremities: Normal range of motion.  Edema: None  Mental Status: Normal mood and affect. Normal behavior. Normal judgment and thought content.   Urinalysis:      Assessment and Plan:  Pregnancy: G2P1001 at 368w2d  1. Family history of congenital heart defect   2. Late prenatal care affecting pregnancy in second trimester, antepartum  - Prenatal Profile  Preterm labor symptoms and general obstetric precautions including but not limited to vaginal bleeding, contractions, leaking of fluid and fetal movement were reviewed in detail with the patient. Please refer to After Visit  Summary for other counseling recommendations.  Return in about 3 weeks (around 05/28/2016) for ROB/GTT.  Dorathy KinsmanVirginia Samanthajo Payano, CNM

## 2016-05-08 LAB — PRENATAL PROFILE (SOLSTAS)
Antibody Screen: NEGATIVE
Basophils Absolute: 0 {cells}/uL (ref 0–200)
Basophils Relative: 0 %
Eosinophils Absolute: 135 {cells}/uL (ref 15–500)
Eosinophils Relative: 1 %
HCT: 31.5 % — ABNORMAL LOW (ref 35.0–45.0)
HIV 1&2 Ab, 4th Generation: NONREACTIVE
Hemoglobin: 10.9 g/dL — ABNORMAL LOW (ref 11.7–15.5)
Hepatitis B Surface Ag: NEGATIVE
Lymphocytes Relative: 16 %
Lymphs Abs: 2160 {cells}/uL (ref 850–3900)
MCH: 30 pg (ref 27.0–33.0)
MCHC: 34.6 g/dL (ref 32.0–36.0)
MCV: 86.8 fL (ref 80.0–100.0)
MPV: 11.3 fL (ref 7.5–12.5)
Monocytes Absolute: 945 {cells}/uL (ref 200–950)
Monocytes Relative: 7 %
Neutro Abs: 10260 {cells}/uL — ABNORMAL HIGH (ref 1500–7800)
Neutrophils Relative %: 76 %
Platelets: 214 K/uL (ref 140–400)
RBC: 3.63 MIL/uL — ABNORMAL LOW (ref 3.80–5.10)
RDW: 12.6 % (ref 11.0–15.0)
Rh Type: NEGATIVE
Rubella: <0.9 {index} (ref <0.90)
WBC: 13.5 K/uL — ABNORMAL HIGH (ref 3.8–10.8)

## 2016-05-10 ENCOUNTER — Encounter: Payer: Self-pay | Admitting: Family Medicine

## 2016-05-17 ENCOUNTER — Encounter: Payer: Self-pay | Admitting: Advanced Practice Midwife

## 2016-05-17 DIAGNOSIS — O09899 Supervision of other high risk pregnancies, unspecified trimester: Secondary | ICD-10-CM | POA: Insufficient documentation

## 2016-05-17 DIAGNOSIS — Z6791 Unspecified blood type, Rh negative: Secondary | ICD-10-CM | POA: Insufficient documentation

## 2016-05-17 DIAGNOSIS — Z283 Underimmunization status: Secondary | ICD-10-CM | POA: Insufficient documentation

## 2016-05-17 DIAGNOSIS — O26899 Other specified pregnancy related conditions, unspecified trimester: Secondary | ICD-10-CM

## 2016-05-17 DIAGNOSIS — O9989 Other specified diseases and conditions complicating pregnancy, childbirth and the puerperium: Secondary | ICD-10-CM

## 2016-05-29 ENCOUNTER — Ambulatory Visit (INDEPENDENT_AMBULATORY_CARE_PROVIDER_SITE_OTHER): Payer: Self-pay | Admitting: Family Medicine

## 2016-05-29 VITALS — BP 95/52 | HR 80 | Wt 111.0 lb

## 2016-05-29 DIAGNOSIS — Z8279 Family history of other congenital malformations, deformations and chromosomal abnormalities: Secondary | ICD-10-CM

## 2016-05-29 DIAGNOSIS — O26899 Other specified pregnancy related conditions, unspecified trimester: Secondary | ICD-10-CM

## 2016-05-29 DIAGNOSIS — Z6791 Unspecified blood type, Rh negative: Secondary | ICD-10-CM

## 2016-05-29 DIAGNOSIS — Z3492 Encounter for supervision of normal pregnancy, unspecified, second trimester: Secondary | ICD-10-CM

## 2016-05-29 DIAGNOSIS — O0932 Supervision of pregnancy with insufficient antenatal care, second trimester: Secondary | ICD-10-CM

## 2016-05-29 DIAGNOSIS — O360920 Maternal care for other rhesus isoimmunization, second trimester, not applicable or unspecified: Secondary | ICD-10-CM

## 2016-05-29 LAB — CBC
HCT: 31.8 % — ABNORMAL LOW (ref 35.0–45.0)
Hemoglobin: 10.9 g/dL — ABNORMAL LOW (ref 11.7–15.5)
MCH: 29.9 pg (ref 27.0–33.0)
MCHC: 34.3 g/dL (ref 32.0–36.0)
MCV: 87.1 fL (ref 80.0–100.0)
MPV: 10.9 fL (ref 7.5–12.5)
PLATELETS: 221 10*3/uL (ref 140–400)
RBC: 3.65 MIL/uL — ABNORMAL LOW (ref 3.80–5.10)
RDW: 12.7 % (ref 11.0–15.0)
WBC: 15.4 10*3/uL — AB (ref 3.8–10.8)

## 2016-05-29 MED ORDER — RHO D IMMUNE GLOBULIN 1500 UNIT/2ML IJ SOSY
300.0000 ug | PREFILLED_SYRINGE | Freq: Once | INTRAMUSCULAR | Status: AC
  Administered 2016-05-29: 300 ug via INTRAMUSCULAR

## 2016-05-29 NOTE — Progress Notes (Signed)
   PRENATAL VISIT NOTE  Subjective:  Marcia Jackson is a 25 y.o. G2P1001 at [redacted]w[redacted]d being seen today for ongoing prenatal care.  She is currently monitored for the following issues for this low-risk pregnancy and has Chlamydia infection affecting pregnancy; BV (bacterial vaginosis); Late prenatal care affecting pregnancy in second trimester; Family history of congenital heart defect; Rh negative, antepartum; and Rubella non-immune status, antepartum on her problem list.  Patient reports no complaints.  Contractions: Not present. Vag. Bleeding: None.  Movement: Present. Denies leaking of fluid.   The following portions of the patient's history were reviewed and updated as appropriate: allergies, current medications, past family history, past medical history, past social history, past surgical history and problem list. Problem list updated.  Objective:   Vitals:   05/29/16 1329  BP: (!) 95/52  Pulse: 80  Weight: 111 lb (50.3 kg)    Fetal Status: Fetal Heart Rate (bpm): 145   Movement: Present     General:  Alert, oriented and cooperative. Patient is in no acute distress.  Skin: Skin is warm and dry. No rash noted.   Cardiovascular: Normal heart rate noted  Respiratory: Normal respiratory effort, no problems with respiration noted  Abdomen: Soft, gravid, appropriate for gestational age. Pain/Pressure: Present     Pelvic:  Cervical exam deferred        Extremities: Normal range of motion.  Edema: None  Mental Status: Normal mood and affect. Normal behavior. Normal judgment and thought content.   Assessment and Plan:  Pregnancy: G2P1001 at 2451w3d  1. Supervision of low-risk pregnancy, second trimester FHT and FH normal. 28 week labs today.  2. Family history of congenital heart defect Echo yesterday - per pt, normal  3. Rh negative, antepartum Rhogam given  4. Late prenatal care affecting pregnancy in second trimester  Preterm labor symptoms and general obstetric precautions  including but not limited to vaginal bleeding, contractions, leaking of fluid and fetal movement were reviewed in detail with the patient. Please refer to After Visit Summary for other counseling recommendations.  Return in about 2 weeks (around 06/12/2016) for OB f/u.  Levie HeritageJacob J Stinson, DO

## 2016-05-30 ENCOUNTER — Other Ambulatory Visit (HOSPITAL_COMMUNITY): Payer: Self-pay

## 2016-05-30 LAB — HIV ANTIBODY (ROUTINE TESTING W REFLEX): HIV 1&2 Ab, 4th Generation: NONREACTIVE

## 2016-05-30 LAB — RPR

## 2016-05-30 LAB — GLUCOSE TOLERANCE, 1 HOUR (50G) W/O FASTING: GLUCOSE, 1 HR, GESTATIONAL: 130 mg/dL (ref <140)

## 2016-06-05 ENCOUNTER — Telehealth (HOSPITAL_BASED_OUTPATIENT_CLINIC_OR_DEPARTMENT_OTHER): Payer: Self-pay | Admitting: Emergency Medicine

## 2016-06-05 NOTE — Telephone Encounter (Signed)
Lost to followup 

## 2016-06-18 ENCOUNTER — Encounter: Payer: Self-pay | Admitting: Advanced Practice Midwife

## 2016-06-18 ENCOUNTER — Inpatient Hospital Stay (HOSPITAL_COMMUNITY)
Admission: AD | Admit: 2016-06-18 | Discharge: 2016-06-18 | Disposition: A | Discharge status: Home / Self Care | Payer: Medicaid Other | Source: Ambulatory Visit | Attending: Family Medicine | Admitting: Family Medicine

## 2016-06-18 DIAGNOSIS — R109 Unspecified abdominal pain: Secondary | ICD-10-CM | POA: Insufficient documentation

## 2016-06-18 DIAGNOSIS — A749 Chlamydial infection, unspecified: Secondary | ICD-10-CM

## 2016-06-18 DIAGNOSIS — B9689 Other specified bacterial agents as the cause of diseases classified elsewhere: Secondary | ICD-10-CM

## 2016-06-18 DIAGNOSIS — Z3A31 31 weeks gestation of pregnancy: Secondary | ICD-10-CM | POA: Insufficient documentation

## 2016-06-18 DIAGNOSIS — Z6791 Unspecified blood type, Rh negative: Secondary | ICD-10-CM

## 2016-06-18 DIAGNOSIS — O26893 Other specified pregnancy related conditions, third trimester: Secondary | ICD-10-CM | POA: Diagnosis present

## 2016-06-18 DIAGNOSIS — N760 Acute vaginitis: Secondary | ICD-10-CM

## 2016-06-18 DIAGNOSIS — Z2839 Other underimmunization status: Secondary | ICD-10-CM

## 2016-06-18 DIAGNOSIS — O0932 Supervision of pregnancy with insufficient antenatal care, second trimester: Secondary | ICD-10-CM

## 2016-06-18 DIAGNOSIS — O9989 Other specified diseases and conditions complicating pregnancy, childbirth and the puerperium: Secondary | ICD-10-CM

## 2016-06-18 DIAGNOSIS — Z283 Underimmunization status: Secondary | ICD-10-CM

## 2016-06-18 DIAGNOSIS — O98813 Other maternal infectious and parasitic diseases complicating pregnancy, third trimester: Secondary | ICD-10-CM

## 2016-06-18 DIAGNOSIS — O26899 Other specified pregnancy related conditions, unspecified trimester: Secondary | ICD-10-CM

## 2016-06-18 LAB — URINE MICROSCOPIC-ADD ON

## 2016-06-18 LAB — URINALYSIS, ROUTINE W REFLEX MICROSCOPIC
Bilirubin Urine: NEGATIVE
GLUCOSE, UA: NEGATIVE mg/dL
HGB URINE DIPSTICK: NEGATIVE
Ketones, ur: NEGATIVE mg/dL
Nitrite: NEGATIVE
PH: 6.5 (ref 5.0–8.0)
PROTEIN: NEGATIVE mg/dL
SPECIFIC GRAVITY, URINE: 1.025 (ref 1.005–1.030)

## 2016-06-18 NOTE — MAU Note (Signed)
Since last night has been having pain in stomach around to back.  Continued to hurt all day at work.

## 2016-06-18 NOTE — MAU Note (Signed)
Not in lobby #2 

## 2016-06-18 NOTE — MAU Note (Signed)
Not in lobby #3 

## 2016-06-18 NOTE — MAU Note (Signed)
Not in lobby#1 

## 2016-06-18 NOTE — MAU Note (Signed)
Urine in lab 

## 2016-06-25 ENCOUNTER — Ambulatory Visit (INDEPENDENT_AMBULATORY_CARE_PROVIDER_SITE_OTHER): Payer: Medicaid Other | Admitting: Medical

## 2016-06-25 VITALS — BP 102/58 | HR 82 | Wt 114.7 lb

## 2016-06-25 DIAGNOSIS — Z23 Encounter for immunization: Secondary | ICD-10-CM

## 2016-06-25 DIAGNOSIS — O26843 Uterine size-date discrepancy, third trimester: Secondary | ICD-10-CM

## 2016-06-25 DIAGNOSIS — Z3492 Encounter for supervision of normal pregnancy, unspecified, second trimester: Secondary | ICD-10-CM

## 2016-06-25 NOTE — Progress Notes (Signed)
   PRENATAL VISIT NOTE  Subjective:  Marcia Jackson is a 25 y.o. G2P1001 at 3948w2d being seen today for ongoing prenatal care.  She is currently monitored for the following issues for this low-risk pregnancy and has Chlamydia infection affecting pregnancy; BV (bacterial vaginosis); Late prenatal care affecting pregnancy in second trimester; Family history of congenital heart defect; Rh negative, antepartum; and Rubella non-immune status, antepartum on her problem list.  Patient reports no complaints.  Contractions: Not present. Vag. Bleeding: None.  Movement: Present. Denies leaking of fluid.   The following portions of the patient's history were reviewed and updated as appropriate: allergies, current medications, past family history, past medical history, past social history, past surgical history and problem list. Problem list updated.  Objective:   Vitals:   06/25/16 1550  BP: (!) 102/58  Pulse: 82  Weight: 114 lb 11.2 oz (52 kg)    Fetal Status: Fetal Heart Rate (bpm): 150 Fundal Height: 28 cm Movement: Present     General:  Alert, oriented and cooperative. Patient is in no acute distress.  Skin: Skin is warm and dry. No rash noted.   Cardiovascular: Normal heart rate noted  Respiratory: Normal respiratory effort, no problems with respiration noted  Abdomen: Soft, gravid, appropriate for gestational age. Pain/Pressure: Present     Pelvic:  Cervical exam deferred        Extremities: Normal range of motion.  Edema: None  Mental Status: Normal mood and affect. Normal behavior. Normal judgment and thought content.   Assessment and Plan:  Pregnancy: G2P1001 at 8148w2d  1. Supervision of low-risk pregnancy, second trimester - Tdap vaccine greater than or equal to 7yo IM - Flu Vaccine QUAD with presevative (Fluzone, Quad MDV)  2. Uterine size date discrepancy, antepartum condition, third trimester - US MFM OB FOLLOW UP; Future  Preterm labor symptoms and general obstetric  precautions including but not limited to vaginal bleeding, contractions, leaking of fluid and fetal movement were reviewed in detail with the patient. Please refer to After Visit Summary for other counseling recommendations.  Return in about 2 weeks (around 07/09/2016) for LROB.   Marny LowensteinJulie N Conleigh Heinlein, PA-C

## 2016-06-25 NOTE — Patient Instructions (Signed)
Braxton Hicks Contractions Contractions of the uterus can occur throughout pregnancy. Contractions are not always a sign that you are in labor.  WHAT ARE BRAXTON HICKS CONTRACTIONS?  Contractions that occur before labor are called Braxton Hicks contractions, or false labor. Toward the end of pregnancy (32-34 weeks), these contractions can develop more often and may become more forceful. This is not true labor because these contractions do not result in opening (dilatation) and thinning of the cervix. They are sometimes difficult to tell apart from true labor because these contractions can be forceful and people have different pain tolerances. You should not feel embarrassed if you go to the hospital with false labor. Sometimes, the only way to tell if you are in true labor is for your health care provider to look for changes in the cervix. If there are no prenatal problems or other health problems associated with the pregnancy, it is completely safe to be sent home with false labor and await the onset of true labor. HOW CAN YOU TELL THE DIFFERENCE BETWEEN TRUE AND FALSE LABOR? False Labor   The contractions of false labor are usually shorter and not as hard as those of true labor.   The contractions are usually irregular.   The contractions are often felt in the front of the lower abdomen and in the groin.   The contractions may go away when you walk around or change positions while lying down.   The contractions get weaker and are shorter lasting as time goes on.   The contractions do not usually become progressively stronger, regular, and closer together as with true labor.  True Labor   Contractions in true labor last 30-70 seconds, become very regular, usually become more intense, and increase in frequency.   The contractions do not go away with walking.   The discomfort is usually felt in the top of the uterus and spreads to the lower abdomen and low back.   True labor can be  determined by your health care provider with an exam. This will show that the cervix is dilating and getting thinner.  WHAT TO REMEMBER  Keep up with your usual exercises and follow other instructions given by your health care provider.   Take medicines as directed by your health care provider.   Keep your regular prenatal appointments.   Eat and drink lightly if you think you are going into labor.   If Braxton Hicks contractions are making you uncomfortable:   Change your position from lying down or resting to walking, or from walking to resting.   Sit and rest in a tub of warm water.   Drink 2-3 glasses of water. Dehydration may cause these contractions.   Do slow and deep breathing several times an hour.  WHEN SHOULD I SEEK IMMEDIATE MEDICAL CARE? Seek immediate medical care if:  Your contractions become stronger, more regular, and closer together.   You have fluid leaking or gushing from your vagina.   You have a fever.   You pass blood-tinged mucus.   You have vaginal bleeding.   You have continuous abdominal pain.   You have low back pain that you never had before.   You feel your baby's head pushing down and causing pelvic pressure.   Your baby is not moving as much as it used to.  This information is not intended to replace advice given to you by your health care provider. Make sure you discuss any questions you have with your health care   provider. Document Released: 07/29/2005 Document Revised: 11/20/2015 Document Reviewed: 05/10/2013 Elsevier Interactive Patient Education  2017 [employer] Introduction Patient Name: ________________________________________________ Patient Due Date: ____________________ What is a fetal movement count? A fetal movement count is the number of times that you feel your baby move during a certain amount of time. This may also be called a fetal kick count. A fetal movement count is recommended for every pregnant  woman. You may be asked to start counting fetal movements as early as week 28 of your pregnancy. Pay attention to when your baby is most active. You may notice your baby's sleep and wake cycles. You may also notice things that make your baby move more. You should do a fetal movement count:  When your baby is normally most active.  At the same time each day. A good time to count movements is while you are resting, after having something to eat and drink. How do I count fetal movements? 1. Find a quiet, comfortable area. Sit, or lie down on your side. 2. Write down the date, the start time and stop time, and the number of movements that you felt between those two times. Take this information with you to your health care visits. 3. For 2 hours, count kicks, flutters, swishes, rolls, and jabs. You should feel at least 10 movements during 2 hours. 4. You may stop counting after you have felt 10 movements. 5. If you do not feel 10 movements in 2 hours, have something to eat and drink. Then, keep resting and counting for 1 hour. If you feel at least 4 movements during that hour, you may stop counting. Contact a health care provider if:  You feel fewer than 4 movements in 2 hours.  Your baby is not moving like he or she usually does. Date: ____________ Start time: ____________ Stop time: ____________ Movements: ____________ Date: ____________ Start time: ____________ Stop time: ____________ Movements: ____________ Date: ____________ Start time: ____________ Stop time: ____________ Movements: ____________ Date: ____________ Start time: ____________ Stop time: ____________ Movements: ____________ Date: ____________ Start time: ____________ Stop time: ____________ Movements: ____________ Date: ____________ Start time: ____________ Stop time: ____________ Movements: ____________ Date: ____________ Start time: ____________ Stop time: ____________ Movements: ____________ Date: ____________ Start time:  ____________ Stop time: ____________ Movements: ____________ Date: ____________ Start time: ____________ Stop time: ____________ Movements: ____________ This information is not intended to replace advice given to you by your health care provider. Make sure you discuss any questions you have with your health care provider. Document Released: 08/28/2006 Document Revised: 03/27/2016 Document Reviewed: 09/07/2015 Elsevier Interactive Patient Education  2017 [employer]  

## 2016-07-10 ENCOUNTER — Encounter: Payer: Self-pay | Admitting: Family Medicine

## 2016-07-10 ENCOUNTER — Ambulatory Visit (HOSPITAL_COMMUNITY)
Admission: RE | Admit: 2016-07-10 | Discharge: 2016-07-10 | Disposition: A | Discharge status: Home / Self Care | Payer: Medicaid Other | Source: Ambulatory Visit | Attending: Medical | Admitting: Medical

## 2016-07-10 ENCOUNTER — Ambulatory Visit (INDEPENDENT_AMBULATORY_CARE_PROVIDER_SITE_OTHER): Payer: Medicaid Other | Admitting: Family Medicine

## 2016-07-10 ENCOUNTER — Encounter (HOSPITAL_COMMUNITY): Payer: Self-pay

## 2016-07-10 VITALS — BP 101/59 | HR 72 | Wt 117.2 lb

## 2016-07-10 DIAGNOSIS — O26843 Uterine size-date discrepancy, third trimester: Secondary | ICD-10-CM | POA: Diagnosis not present

## 2016-07-10 DIAGNOSIS — Z3492 Encounter for supervision of normal pregnancy, unspecified, second trimester: Secondary | ICD-10-CM

## 2016-07-10 DIAGNOSIS — Z3A34 34 weeks gestation of pregnancy: Secondary | ICD-10-CM | POA: Insufficient documentation

## 2016-07-10 DIAGNOSIS — O0932 Supervision of pregnancy with insufficient antenatal care, second trimester: Secondary | ICD-10-CM

## 2016-07-10 NOTE — Addendum Note (Signed)
Encounter addended by: Lestine MountSybil Ralyn Stlaurent, RT on: 07/10/2016  4:59 PM<BR>    Actions taken: Imaging Exam ended

## 2016-07-10 NOTE — Progress Notes (Signed)
   PRENATAL VISIT NOTE  Subjective:  Marcia Jackson is a 25 y.o. G2P1001 at 4222w3d being seen today for ongoing prenatal care.  She is currently monitored for the following issues for this low-risk pregnancy and has Chlamydia infection affecting pregnancy; BV (bacterial vaginosis); Late prenatal care affecting pregnancy in second trimester; Family history of congenital heart defect; Rh negative, antepartum; and Rubella non-immune status, antepartum on her problem list.  Patient reports no complaints.  Contractions: Not present.  .  Movement: Present. Denies leaking of fluid.   The following portions of the patient's history were reviewed and updated as appropriate: allergies, current medications, past family history, past medical history, past social history, past surgical history and problem list. Problem list updated.  Objective:   Vitals:   07/10/16 1514  BP: (!) 101/59  Pulse: 72  Weight: 117 lb 3.2 oz (53.2 kg)    Fetal Status: Fetal Heart Rate (bpm): 132 Fundal Height: 29 cm Movement: Present     General:  Alert, oriented and cooperative. Patient is in no acute distress.  Skin: Skin is warm and dry. No rash noted.   Cardiovascular: Normal heart rate noted  Respiratory: Normal respiratory effort, no problems with respiration noted  Abdomen: Soft, gravid, appropriate for gestational age. Pain/Pressure: Present     Pelvic:  Cervical exam deferred        Extremities: Normal range of motion.     Mental Status: Normal mood and affect. Normal behavior. Normal judgment and thought content.   Assessment and Plan:  Pregnancy: G2P1001 at 8922w3d  1. Late prenatal care affecting pregnancy in second trimester S < D for u/s for growth today  Preterm labor symptoms and general obstetric precautions including but not limited to vaginal bleeding, contractions, leaking of fluid and fetal movement were reviewed in detail with the patient. Please refer to After Visit Summary for other counseling  recommendations.  Return in 2 weeks (on 07/24/2016).   Reva Boresanya S Oakley Orban, MD

## 2016-07-10 NOTE — Patient Instructions (Addendum)
Third Trimester of Pregnancy The third trimester is from week 29 through week 40 (months 7 through 9). The third trimester is a time when the unborn baby (fetus) is growing rapidly. At the end of the ninth month, the fetus is about 20 inches in length and weighs 6-10 pounds. Body changes during your third trimester Your body goes through many changes during pregnancy. The changes vary from woman to woman. During the third trimester:  Your weight will continue to increase. You can expect to gain 25-35 pounds (11-16 kg) by the end of the pregnancy.  You may begin to get stretch marks on your hips, abdomen, and breasts.  You may urinate more often because the fetus is moving lower into your pelvis and pressing on your bladder.  You may develop or continue to have heartburn. This is caused by increased hormones that slow down muscles in the digestive tract.  You may develop or continue to have constipation because increased hormones slow digestion and cause the muscles that push waste through your intestines to relax.  You may develop hemorrhoids. These are swollen veins (varicose veins) in the rectum that can itch or be painful.  You may develop swollen, bulging veins (varicose veins) in your legs.  You may have increased body aches in the pelvis, back, or thighs. This is due to weight gain and increased hormones that are relaxing your joints.  You may have changes in your hair. These can include thickening of your hair, rapid growth, and changes in texture. Some women also have hair loss during or after pregnancy, or hair that feels dry or thin. Your hair will most likely return to normal after your baby is born.  Your breasts will continue to grow and they will continue to become tender. A yellow fluid (colostrum) may leak from your breasts. This is the first milk you are producing for your baby.  Your belly button may stick out.  You may notice more swelling in your hands, face, or  ankles.  You may have increased tingling or numbness in your hands, arms, and legs. The skin on your belly may also feel numb.  You may feel short of breath because of your expanding uterus.  You may have more problems sleeping. This can be caused by the size of your belly, increased need to urinate, and an increase in your body's metabolism.  You may notice the fetus "dropping," or moving lower in your abdomen.  You may have increased vaginal discharge.  Your cervix becomes thin and soft (effaced) near your due date. What to expect at prenatal visits You will have prenatal exams every 2 weeks until week 36. Then you will have weekly prenatal exams. During a routine prenatal visit:  You will be weighed to make sure you and the fetus are growing normally.  Your blood pressure will be taken.  Your abdomen will be measured to track your baby's growth.  The fetal heartbeat will be listened to.  Any test results from the previous visit will be discussed.  You may have a cervical check near your due date to see if you have effaced. At around 36 weeks, your health care provider will check your cervix. At the same time, your health care provider will also perform a test on the secretions of the vaginal tissue. This test is to determine if a type of bacteria, Group B streptococcus, is present. Your health care provider will explain this further. Your health care provider may ask you:    What your birth plan is.  How you are feeling.  If you are feeling the baby move.  If you have had any abnormal symptoms, such as leaking fluid, bleeding, severe headaches, or abdominal cramping.  If you are using any tobacco products, including cigarettes, chewing tobacco, and electronic cigarettes.  If you have any questions. Other tests or screenings that may be performed during your third trimester include:  Blood tests that check for low iron levels (anemia).  Fetal testing to check the health,  activity level, and growth of the fetus. Testing is done if you have certain medical conditions or if there are problems during the pregnancy.  Nonstress test (NST). This test checks the health of your baby to make sure there are no signs of problems, such as the baby not getting enough oxygen. During this test, a belt is placed around your belly. The baby is made to move, and its heart rate is monitored during movement. What is false labor? False labor is a condition in which you feel small, irregular tightenings of the muscles in the womb (contractions) that eventually go away. These are called Braxton Hicks contractions. Contractions may last for hours, days, or even weeks before true labor sets in. If contractions come at regular intervals, become more frequent, increase in intensity, or become painful, you should see your health care provider. What are the signs of labor?  Abdominal cramps.  Regular contractions that start at 10 minutes apart and become stronger and more frequent with time.  Contractions that start on the top of the uterus and spread down to the lower abdomen and back.  Increased pelvic pressure and dull back pain.  A watery or bloody mucus discharge that comes from the vagina.  Leaking of amniotic fluid. This is also known as your "water breaking." It could be a slow trickle or a gush. Let your doctor know if it has a color or strange odor. If you have any of these signs, call your health care provider right away, even if it is before your due date. Follow these instructions at home: Eating and drinking  Continue to eat regular, healthy meals.  Do not eat:  Raw meat or meat spreads.  Unpasteurized milk or cheese.  Unpasteurized juice.  Store-made salad.  Refrigerated smoked seafood.  Hot dogs or deli meat, unless they are piping hot.  More than 6 ounces of albacore tuna a week.  Shark, swordfish, king mackerel, or tile fish.  Store-made salads.  Raw  sprouts, such as mung bean or alfalfa sprouts.  Take prenatal vitamins as told by your health care provider.  Take 1000 mg of calcium daily as told by your health care provider.  If you develop constipation:  Take over-the-counter or prescription medicines.  Drink enough fluid to keep your urine clear or pale yellow.  Eat foods that are high in fiber, such as fresh fruits and vegetables, whole grains, and beans.  Limit foods that are high in fat and processed sugars, such as fried and sweet foods. Activity  Exercise only as directed by your health care provider. Healthy pregnant women should aim for 2 hours and 30 minutes of moderate exercise per week. If you experience any pain or discomfort while exercising, stop.  Avoid heavy lifting.  Do not exercise in extreme heat or humidity, or at high altitudes.  Wear low-heel, comfortable shoes.  Practice good posture.  Do not travel far distances unless it is absolutely necessary and only with the approval   of your health care provider.  Wear your seat belt at all times while in a car, on a bus, or on a plane.  Take frequent breaks and rest with your legs elevated if you have leg cramps or low back pain.  Do not use hot tubs, steam rooms, or saunas.  You may continue to have sex unless your health care provider tells you otherwise. Lifestyle  Do not use any products that contain nicotine or tobacco, such as cigarettes and e-cigarettes. If you need help quitting, ask your health care provider.  Do not drink alcohol.  Do not use any medicinal herbs or unprescribed drugs. These chemicals affect the formation and growth of the baby.  If you develop varicose veins:  Wear support pantyhose or compression stockings as told by your healthcare provider.  Elevate your feet for 15 minutes, 3-4 times a day.  Wear a supportive maternity bra to help with breast tenderness. General instructions  Take over-the-counter and prescription  medicines only as told by your health care provider. There are medicines that are either safe or unsafe to take during pregnancy.  Take warm sitz baths to soothe any pain or discomfort caused by hemorrhoids. Use hemorrhoid cream or witch hazel if your health care provider approves.  Avoid cat litter boxes and soil used by cats. These carry germs that can cause birth defects in the baby. If you have a cat, ask someone to clean the litter box for you.  To prepare for the arrival of your baby:  Take prenatal classes to understand, practice, and ask questions about the labor and delivery.  Make a trial run to the hospital.  Visit the hospital and tour the maternity area.  Arrange for maternity or paternity leave through employers.  Arrange for family and friends to take care of pets while you are in the hospital.  Purchase a rear-facing car seat and make sure you know how to install it in your car.  Pack your hospital bag.  Prepare the baby's nursery. Make sure to remove all pillows and stuffed animals from the baby's crib to prevent suffocation.  Visit your dentist if you have not gone during your pregnancy. Use a soft toothbrush to brush your teeth and be gentle when you floss.  Keep all prenatal follow-up visits as told by your health care provider. This is important. Contact a health care provider if:  You are unsure if you are in labor or if your water has broken.  You become dizzy.  You have mild pelvic cramps, pelvic pressure, or nagging pain in your abdominal area.  You have lower back pain.  You have persistent nausea, vomiting, or diarrhea.  You have an unusual or bad smelling vaginal discharge.  You have pain when you urinate. Get help right away if:  You have a fever.  You are leaking fluid from your vagina.  You have spotting or bleeding from your vagina.  You have severe abdominal pain or cramping.  You have rapid weight loss or weight gain.  You have  shortness of breath with chest pain.  You notice sudden or extreme swelling of your face, hands, ankles, feet, or legs.  Your baby makes fewer than 10 movements in 2 hours.  You have severe headaches that do not go away with medicine.  You have vision changes. Summary  The third trimester is from week 29 through week 40, months 7 through 9. The third trimester is a time when the unborn baby (fetus)   is growing rapidly.  During the third trimester, your discomfort may increase as you and your baby continue to gain weight. You may have abdominal, leg, and back pain, sleeping problems, and an increased need to urinate.  During the third trimester your breasts will keep growing and they will continue to become tender. A yellow fluid (colostrum) may leak from your breasts. This is the first milk you are producing for your baby.  False labor is a condition in which you feel small, irregular tightenings of the muscles in the womb (contractions) that eventually go away. These are called Braxton Hicks contractions. Contractions may last for hours, days, or even weeks before true labor sets in.  Signs of labor can include: abdominal cramps; regular contractions that start at 10 minutes apart and become stronger and more frequent with time; watery or bloody mucus discharge that comes from the vagina; increased pelvic pressure and dull back pain; and leaking of amniotic fluid. This information is not intended to replace advice given to you by your health care provider. Make sure you discuss any questions you have with your health care provider. Document Released: 07/23/2001 Document Revised: 01/04/2016 Document Reviewed: 09/29/2012 [employer] Interactive Patient Education  2017 [employer]   Breastfeeding Deciding to breastfeed is one of the best choices you can make for you and your baby. A change in hormones during pregnancy causes your breast tissue to grow and increases the number and size of your  milk ducts. These hormones also allow proteins, sugars, and fats from your blood supply to make breast milk in your milk-producing glands. Hormones prevent breast milk from being released before your baby is born as well as prompt milk flow after birth. Once breastfeeding has begun, thoughts of your baby, as well as his or her sucking or crying, can stimulate the release of milk from your milk-producing glands. Benefits of breastfeeding For Your Baby  Your first milk (colostrum) helps your baby's digestive system function better.  There are antibodies in your milk that help your baby fight off infections.  Your baby has a lower incidence of asthma, allergies, and sudden infant death syndrome.  The nutrients in breast milk are better for your baby than infant formulas and are designed uniquely for your baby's needs.  Breast milk improves your baby's brain development.  Your baby is less likely to develop other conditions, such as childhood obesity, asthma, or type 2 diabetes mellitus. For You  Breastfeeding helps to create a very special bond between you and your baby.  Breastfeeding is convenient. Breast milk is always available at the correct temperature and costs nothing.  Breastfeeding helps to burn calories and helps you lose the weight gained during pregnancy.  Breastfeeding makes your uterus contract to its prepregnancy size faster and slows bleeding (lochia) after you give birth.  Breastfeeding helps to lower your risk of developing type 2 diabetes mellitus, osteoporosis, and breast or ovarian cancer later in life. Signs that your baby is hungry Early Signs of Hunger  Increased alertness or activity.  Stretching.  Movement of the head from side to side.  Movement of the head and opening of the mouth when the corner of the mouth or cheek is stroked (rooting).  Increased sucking sounds, smacking lips, cooing, sighing, or squeaking.  Hand-to-mouth movements.  Increased  sucking of fingers or hands. Late Signs of Hunger  Fussing.  Intermittent crying. Extreme Signs of Hunger  Signs of extreme hunger will require calming and consoling before your baby will   be able to breastfeed successfully. Do not wait for the following signs of extreme hunger to occur before you initiate breastfeeding:  Restlessness.  A loud, strong cry.  Screaming. Breastfeeding basics  Breastfeeding Initiation  Find a comfortable place to sit or lie down, with your neck and back well supported.  Place a pillow or rolled up blanket under your baby to bring him or her to the level of your breast (if you are seated). Nursing pillows are specially designed to help support your arms and your baby while you breastfeed.  Make sure that your baby's abdomen is facing your abdomen.  Gently massage your breast. With your fingertips, massage from your chest wall toward your nipple in a circular motion. This encourages milk flow. You may need to continue this action during the feeding if your milk flows slowly.  Support your breast with 4 fingers underneath and your thumb above your nipple. Make sure your fingers are well away from your nipple and your baby's mouth.  Stroke your baby's lips gently with your finger or nipple.  When your baby's mouth is open wide enough, quickly bring your baby to your breast, placing your entire nipple and as much of the colored area around your nipple (areola) as possible into your baby's mouth.  More areola should be visible above your baby's upper lip than below the lower lip.  Your baby's tongue should be between his or her lower gum and your breast.  Ensure that your baby's mouth is correctly positioned around your nipple (latched). Your baby's lips should create a seal on your breast and be turned out (everted).  It is common for your baby to suck about 2-3 minutes in order to start the flow of breast milk. Latching  Teaching your baby how to latch  on to your breast properly is very important. An improper latch can cause nipple pain and decreased milk supply for you and poor weight gain in your baby. Also, if your baby is not latched onto your nipple properly, he or she may swallow some air during feeding. This can make your baby fussy. Burping your baby when you switch breasts during the feeding can help to get rid of the air. However, teaching your baby to latch on properly is still the best way to prevent fussiness from swallowing air while breastfeeding. Signs that your baby has successfully latched on to your nipple:  Silent tugging or silent sucking, without causing you pain.  Swallowing heard between every 3-4 sucks.  Muscle movement above and in front of his or her ears while sucking. Signs that your baby has not successfully latched on to nipple:  Sucking sounds or smacking sounds from your baby while breastfeeding.  Nipple pain. If you think your baby has not latched on correctly, slip your finger into the corner of your baby's mouth to break the suction and place it between your baby's gums. Attempt breastfeeding initiation again. Signs of Successful Breastfeeding  Signs from your baby:  A gradual decrease in the number of sucks or complete cessation of sucking.  Falling asleep.  Relaxation of his or her body.  Retention of a small amount of milk in his or her mouth.  Letting go of your breast by himself or herself. Signs from you:  Breasts that have increased in firmness, weight, and size 1-3 hours after feeding.  Breasts that are softer immediately after breastfeeding.  Increased milk volume, as well as a change in milk consistency and color by   the fifth day of breastfeeding.  Nipples that are not sore, cracked, or bleeding. Signs That Your Baby is Getting Enough Milk  Wetting at least 1-2 diapers during the first 24 hours after birth.  Wetting at least 5-6 diapers every 24 hours for the first week after  birth. The urine should be clear or pale yellow by 5 days after birth.  Wetting 6-8 diapers every 24 hours as your baby continues to grow and develop.  At least 3 stools in a 24-hour period by age 5 days. The stool should be soft and yellow.  At least 3 stools in a 24-hour period by age 7 days. The stool should be seedy and yellow.  No loss of weight greater than 10% of birth weight during the first 3 days of age.  Average weight gain of 4-7 ounces (113-198 g) per week after age 4 days.  Consistent daily weight gain by age 5 days, without weight loss after the age of 2 weeks. After a feeding, your baby may spit up a small amount. This is common. Breastfeeding frequency and duration Frequent feeding will help you make more milk and can prevent sore nipples and breast engorgement. Breastfeed when you feel the need to reduce the fullness of your breasts or when your baby shows signs of hunger. This is called "breastfeeding on demand." Avoid introducing a pacifier to your baby while you are working to establish breastfeeding (the first 4-6 weeks after your baby is born). After this time you may choose to use a pacifier. Research has shown that pacifier use during the first year of a baby's life decreases the risk of sudden infant death syndrome (SIDS). Allow your baby to feed on each breast as long as he or she wants. Breastfeed until your baby is finished feeding. When your baby unlatches or falls asleep while feeding from the first breast, offer the second breast. Because newborns are often sleepy in the first few weeks of life, you may need to awaken your baby to get him or her to feed. Breastfeeding times will vary from baby to baby. However, the following rules can serve as a guide to help you ensure that your baby is properly fed:  Newborns (babies 4 weeks of age or younger) may breastfeed every 1-3 hours.  Newborns should not go longer than 3 hours during the day or 5 hours during the night  without breastfeeding.  You should breastfeed your baby a minimum of 8 times in a 24-hour period until you begin to introduce solid foods to your baby at around 6 months of age. Breast milk pumping Pumping and storing breast milk allows you to ensure that your baby is exclusively fed your breast milk, even at times when you are unable to breastfeed. This is especially important if you are going back to work while you are still breastfeeding or when you are not able to be present during feedings. Your lactation consultant can give you guidelines on how long it is safe to store breast milk. A breast pump is a machine that allows you to pump milk from your breast into a sterile bottle. The pumped breast milk can then be stored in a refrigerator or freezer. Some breast pumps are operated by hand, while others use electricity. Ask your lactation consultant which type will work best for you. Breast pumps can be purchased, but some hospitals and breastfeeding support groups lease breast pumps on a monthly basis. A lactation consultant can teach you how to   hand express breast milk, if you prefer not to use a pump. Caring for your breasts while you breastfeed Nipples can become dry, cracked, and sore while breastfeeding. The following recommendations can help keep your breasts moisturized and healthy:  Avoid using soap on your nipples.  Wear a supportive bra. Although not required, special nursing bras and tank tops are designed to allow access to your breasts for breastfeeding without taking off your entire bra or top. Avoid wearing underwire-style bras or extremely tight bras.  Air dry your nipples for 3-4minutes after each feeding.  Use only cotton bra pads to absorb leaked breast milk. Leaking of breast milk between feedings is normal.  Use lanolin on your nipples after breastfeeding. Lanolin helps to maintain your skin's normal moisture barrier. If you use pure lanolin, you do not need to wash it off  before feeding your baby again. Pure lanolin is not toxic to your baby. You may also hand express a few drops of breast milk and gently massage that milk into your nipples and allow the milk to air dry. In the first few weeks after giving birth, some women experience extremely full breasts (engorgement). Engorgement can make your breasts feel heavy, warm, and tender to the touch. Engorgement peaks within 3-5 days after you give birth. The following recommendations can help ease engorgement:  Completely empty your breasts while breastfeeding or pumping. You may want to start by applying warm, moist heat (in the shower or with warm water-soaked hand towels) just before feeding or pumping. This increases circulation and helps the milk flow. If your baby does not completely empty your breasts while breastfeeding, pump any extra milk after he or she is finished.  Wear a snug bra (nursing or regular) or tank top for 1-2 days to signal your body to slightly decrease milk production.  Apply ice packs to your breasts, unless this is too uncomfortable for you.  Make sure that your baby is latched on and positioned properly while breastfeeding. If engorgement persists after 48 hours of following these recommendations, contact your health care provider or a lactation consultant. Overall health care recommendations while breastfeeding  Eat healthy foods. Alternate between meals and snacks, eating 3 of each per day. Because what you eat affects your breast milk, some of the foods may make your baby more irritable than usual. Avoid eating these foods if you are sure that they are negatively affecting your baby.  Drink milk, fruit juice, and water to satisfy your thirst (about 10 glasses a day).  Rest often, relax, and continue to take your prenatal vitamins to prevent fatigue, stress, and anemia.  Continue breast self-awareness checks.  Avoid chewing and smoking tobacco. Chemicals from cigarettes that pass  into breast milk and exposure to secondhand smoke may harm your baby.  Avoid alcohol and drug use, including marijuana. Some medicines that may be harmful to your baby can pass through breast milk. It is important to ask your health care provider before taking any medicine, including all over-the-counter and prescription medicine as well as vitamin and herbal supplements. It is possible to become pregnant while breastfeeding. If birth control is desired, ask your health care provider about options that will be safe for your baby. Contact a health care provider if:  You feel like you want to stop breastfeeding or have become frustrated with breastfeeding.  You have painful breasts or nipples.  Your nipples are cracked or bleeding.  Your breasts are red, tender, or warm.  You have   a swollen area on either breast.  You have a fever or chills.  You have nausea or vomiting.  You have drainage other than breast milk from your nipples.  Your breasts do not become full before feedings by the fifth day after you give birth.  You feel sad and depressed.  Your baby is too sleepy to eat well.  Your baby is having trouble sleeping.  Your baby is wetting less than 3 diapers in a 24-hour period.  Your baby has less than 3 stools in a 24-hour period.  Your baby's skin or the white part of his or her eyes becomes yellow.  Your baby is not gaining weight by 5 days of age. Get help right away if:  Your baby is overly tired (lethargic) and does not want to wake up and feed.  Your baby develops an unexplained fever. This information is not intended to replace advice given to you by your health care provider. Make sure you discuss any questions you have with your health care provider. Document Released: 07/29/2005 Document Revised: 01/10/2016 Document Reviewed: 01/20/2013 Elsevier Interactive Patient Education  2017 Elsevier Inc.  

## 2016-07-24 ENCOUNTER — Other Ambulatory Visit (HOSPITAL_COMMUNITY)
Admission: RE | Admit: 2016-07-24 | Discharge: 2016-07-24 | Disposition: A | Discharge status: Home / Self Care | Payer: Medicaid Other | Source: Ambulatory Visit | Attending: Medical | Admitting: Medical

## 2016-07-24 ENCOUNTER — Ambulatory Visit (INDEPENDENT_AMBULATORY_CARE_PROVIDER_SITE_OTHER): Payer: Medicaid Other | Admitting: Medical

## 2016-07-24 VITALS — BP 106/55 | HR 78 | Wt 114.2 lb

## 2016-07-24 DIAGNOSIS — Z113 Encounter for screening for infections with a predominantly sexual mode of transmission: Secondary | ICD-10-CM | POA: Insufficient documentation

## 2016-07-24 DIAGNOSIS — Z3403 Encounter for supervision of normal first pregnancy, third trimester: Secondary | ICD-10-CM

## 2016-07-24 LAB — OB RESULTS CONSOLE GBS: GBS: NEGATIVE

## 2016-07-24 NOTE — Patient Instructions (Signed)
Introduction Patient Name: ________________________________________________ Patient Due Date: ____________________ What is a fetal movement count? A fetal movement count is the number of times that you feel your baby move during a certain amount of time. This may also be called a fetal kick count. A fetal movement count is recommended for every pregnant woman. You may be asked to start counting fetal movements as early as week 28 of your pregnancy. Pay attention to when your baby is most active. You may notice your baby's sleep and wake cycles. You may also notice things that make your baby move more. You should do a fetal movement count:  When your baby is normally most active.  At the same time each day. A good time to count movements is while you are resting, after having something to eat and drink. How do I count fetal movements? 1. Find a quiet, comfortable area. Sit, or lie down on your side. 2. Write down the date, the start time and stop time, and the number of movements that you felt between those two times. Take this information with you to your health care visits. 3. For 2 hours, count kicks, flutters, swishes, rolls, and jabs. You should feel at least 10 movements during 2 hours. 4. You may stop counting after you have felt 10 movements. 5. If you do not feel 10 movements in 2 hours, have something to eat and drink. Then, keep resting and counting for 1 hour. If you feel at least 4 movements during that hour, you may stop counting. Contact a health care provider if:  You feel fewer than 4 movements in 2 hours.  Your baby is not moving like he or she usually does. Date: ____________ Start time: ____________ Stop time: ____________ Movements: ____________ Date: ____________ Start time: ____________ Stop time: ____________ Movements: ____________ Date: ____________ Start time: ____________ Stop time: ____________ Movements: ____________ Date: ____________ Start time: ____________  Stop time: ____________ Movements: ____________ Date: ____________ Start time: ____________ Stop time: ____________ Movements: ____________ Date: ____________ Start time: ____________ Stop time: ____________ Movements: ____________ Date: ____________ Start time: ____________ Stop time: ____________ Movements: ____________ Date: ____________ Start time: ____________ Stop time: ____________ Movements: ____________ Date: ____________ Start time: ____________ Stop time: ____________ Movements: ____________ This information is not intended to replace advice given to you by your health care provider. Make sure you discuss any questions you have with your health care provider. Document Released: 08/28/2006 Document Revised: 03/27/2016 Document Reviewed: 09/07/2015 Elsevier Interactive Patient Education  2017 Elsevier Inc. Braxton Hicks Contractions Contractions of the uterus can occur throughout pregnancy. Contractions are not always a sign that you are in labor.  WHAT ARE BRAXTON HICKS CONTRACTIONS?  Contractions that occur before labor are called Braxton Hicks contractions, or false labor. Toward the end of pregnancy (32-34 weeks), these contractions can develop more often and may become more forceful. This is not true labor because these contractions do not result in opening (dilatation) and thinning of the cervix. They are sometimes difficult to tell apart from true labor because these contractions can be forceful and people have different pain tolerances. You should not feel embarrassed if you go to the hospital with false labor. Sometimes, the only way to tell if you are in true labor is for your health care provider to look for changes in the cervix. If there are no prenatal problems or other health problems associated with the pregnancy, it is completely safe to be sent home with false labor and await the onset of true labor.   HOW CAN YOU TELL THE DIFFERENCE BETWEEN TRUE AND FALSE LABOR? False Labor     The contractions of false labor are usually shorter and not as hard as those of true labor.   The contractions are usually irregular.   The contractions are often felt in the front of the lower abdomen and in the groin.   The contractions may go away when you walk around or change positions while lying down.   The contractions get weaker and are shorter lasting as time goes on.   The contractions do not usually become progressively stronger, regular, and closer together as with true labor.  True Labor   Contractions in true labor last 30-70 seconds, become very regular, usually become more intense, and increase in frequency.   The contractions do not go away with walking.   The discomfort is usually felt in the top of the uterus and spreads to the lower abdomen and low back.   True labor can be determined by your health care provider with an exam. This will show that the cervix is dilating and getting thinner.  WHAT TO REMEMBER  Keep up with your usual exercises and follow other instructions given by your health care provider.   Take medicines as directed by your health care provider.   Keep your regular prenatal appointments.   Eat and drink lightly if you think you are going into labor.   If Braxton Hicks contractions are making you uncomfortable:   Change your position from lying down or resting to walking, or from walking to resting.   Sit and rest in a tub of warm water.   Drink 2-3 glasses of water. Dehydration may cause these contractions.   Do slow and deep breathing several times an hour.  WHEN SHOULD I SEEK IMMEDIATE MEDICAL CARE? Seek immediate medical care if:  Your contractions become stronger, more regular, and closer together.   You have fluid leaking or gushing from your vagina.   You have a fever.   You pass blood-tinged mucus.   You have vaginal bleeding.   You have continuous abdominal pain.   You have low back pain  that you never had before.   You feel your baby's head pushing down and causing pelvic pressure.   Your baby is not moving as much as it used to.  This information is not intended to replace advice given to you by your health care provider. Make sure you discuss any questions you have with your health care provider. Document Released: 07/29/2005 Document Revised: 11/20/2015 Document Reviewed: 05/10/2013 Elsevier Interactive Patient Education  2017 Elsevier Inc.  

## 2016-07-24 NOTE — Progress Notes (Signed)
Cultures today 

## 2016-07-24 NOTE — Progress Notes (Signed)
   PRENATAL VISIT NOTE  Subjective:  Marcia LanJessica L Jackson is a 25 y.o. G2P1001 at 74101w3d being seen today for ongoing prenatal care.  She is currently monitored for the following issues for this low-risk pregnancy and has Chlamydia infection affecting pregnancy; BV (bacterial vaginosis); Late prenatal care affecting pregnancy in second trimester; Family history of congenital heart defect; Rh negative, antepartum; and Rubella non-immune status, antepartum on her problem list.  Patient reports no complaints.  Contractions: Irritability. Vag. Bleeding: None.  Movement: Present. Denies leaking of fluid.   The following portions of the patient's history were reviewed and updated as appropriate: allergies, current medications, past family history, past medical history, past social history, past surgical history and problem list. Problem list updated.  Objective:   Vitals:   07/24/16 1342  BP: (!) 106/55  Pulse: 78  Weight: 114 lb 3.2 oz (51.8 kg)    Fetal Status: Fetal Heart Rate (bpm): 140 Fundal Height: 31 cm Movement: Present  Presentation: Vertex  General:  Alert, oriented and cooperative. Patient is in no acute distress.  Skin: Skin is warm and dry. No rash noted.   Cardiovascular: Normal heart rate noted  Respiratory: Normal respiratory effort, no problems with respiration noted  Abdomen: Soft, gravid, appropriate for gestational age. Pain/Pressure: Present     Pelvic:  Cervical exam performed Dilation: 1 Effacement (%): Thick Station: -2  Extremities: Normal range of motion.  Edema: None  Mental Status: Normal mood and affect. Normal behavior. Normal judgment and thought content.   Assessment and Plan:  Pregnancy: G2P1001 at 2101w3d  1. Encounter for supervision of normal first pregnancy in third trimester - Culture, beta strep (group b only) - [facility]  Preterm labor symptoms and general obstetric precautions including but not limited to vaginal  bleeding, contractions, leaking of fluid and fetal movement were reviewed in detail with the patient. Please refer to After Visit Summary for other counseling recommendations.  Return in about 1 week (around 07/31/2016) for LOB.   Marny LowensteinJulie N Jon Kasparek, PA-C

## 2016-07-25 LAB — GC/CHLAMYDIA PROBE AMP (~~LOC~~) NOT AT ARMC
Chlamydia: NEGATIVE
Neisseria Gonorrhea: NEGATIVE

## 2016-07-27 LAB — CULTURE, BETA STREP (GROUP B ONLY)

## 2016-07-31 ENCOUNTER — Inpatient Hospital Stay (HOSPITAL_COMMUNITY)
Admission: AD | Admit: 2016-07-31 | Discharge: 2016-07-31 | Disposition: A | Discharge status: Home / Self Care | Payer: Medicaid Other | Source: Ambulatory Visit | Attending: Family Medicine | Admitting: Family Medicine

## 2016-07-31 ENCOUNTER — Encounter (HOSPITAL_COMMUNITY): Payer: Self-pay | Admitting: *Deleted

## 2016-07-31 DIAGNOSIS — Z6791 Unspecified blood type, Rh negative: Secondary | ICD-10-CM | POA: Diagnosis not present

## 2016-07-31 DIAGNOSIS — B9689 Other specified bacterial agents as the cause of diseases classified elsewhere: Secondary | ICD-10-CM | POA: Insufficient documentation

## 2016-07-31 DIAGNOSIS — O98813 Other maternal infectious and parasitic diseases complicating pregnancy, third trimester: Secondary | ICD-10-CM

## 2016-07-31 DIAGNOSIS — N760 Acute vaginitis: Secondary | ICD-10-CM

## 2016-07-31 DIAGNOSIS — A568 Sexually transmitted chlamydial infection of other sites: Secondary | ICD-10-CM | POA: Diagnosis not present

## 2016-07-31 DIAGNOSIS — O99333 Smoking (tobacco) complicating pregnancy, third trimester: Secondary | ICD-10-CM | POA: Insufficient documentation

## 2016-07-31 DIAGNOSIS — O26893 Other specified pregnancy related conditions, third trimester: Secondary | ICD-10-CM | POA: Insufficient documentation

## 2016-07-31 DIAGNOSIS — O9989 Other specified diseases and conditions complicating pregnancy, childbirth and the puerperium: Secondary | ICD-10-CM

## 2016-07-31 DIAGNOSIS — O26899 Other specified pregnancy related conditions, unspecified trimester: Secondary | ICD-10-CM

## 2016-07-31 DIAGNOSIS — Z3A37 37 weeks gestation of pregnancy: Secondary | ICD-10-CM | POA: Diagnosis not present

## 2016-07-31 DIAGNOSIS — O0933 Supervision of pregnancy with insufficient antenatal care, third trimester: Secondary | ICD-10-CM | POA: Insufficient documentation

## 2016-07-31 DIAGNOSIS — O98313 Other infections with a predominantly sexual mode of transmission complicating pregnancy, third trimester: Secondary | ICD-10-CM | POA: Diagnosis not present

## 2016-07-31 DIAGNOSIS — O23593 Infection of other part of genital tract in pregnancy, third trimester: Secondary | ICD-10-CM | POA: Insufficient documentation

## 2016-07-31 DIAGNOSIS — Z2839 Other underimmunization status: Secondary | ICD-10-CM

## 2016-07-31 DIAGNOSIS — F17210 Nicotine dependence, cigarettes, uncomplicated: Secondary | ICD-10-CM | POA: Diagnosis not present

## 2016-07-31 DIAGNOSIS — R109 Unspecified abdominal pain: Secondary | ICD-10-CM | POA: Diagnosis present

## 2016-07-31 DIAGNOSIS — A749 Chlamydial infection, unspecified: Secondary | ICD-10-CM

## 2016-07-31 DIAGNOSIS — O0932 Supervision of pregnancy with insufficient antenatal care, second trimester: Secondary | ICD-10-CM

## 2016-07-31 DIAGNOSIS — Z283 Underimmunization status: Secondary | ICD-10-CM

## 2016-07-31 LAB — URINALYSIS, ROUTINE W REFLEX MICROSCOPIC
Bilirubin Urine: NEGATIVE
Glucose, UA: NEGATIVE mg/dL
Hgb urine dipstick: NEGATIVE
Ketones, ur: NEGATIVE mg/dL
Nitrite: NEGATIVE
Protein, ur: NEGATIVE mg/dL
Specific Gravity, Urine: 1.019 (ref 1.005–1.030)
pH: 6 (ref 5.0–8.0)

## 2016-07-31 NOTE — Discharge Instructions (Signed)
Abdominal Pain During Pregnancy °Belly (abdominal) pain is common during pregnancy. Most of the time, it is not a serious problem. Other times, it can be a sign that something is wrong with the pregnancy. Always tell your doctor if you have belly pain. °Follow these instructions at home: °Monitor your belly pain for any changes. The following actions may help you feel better: °· Do not have sex (intercourse) or put anything in your vagina until you feel better. °· Rest until your pain stops. °· Drink clear fluids if you feel sick to your stomach (nauseous). Do not eat solid food until you feel better. °· Only take medicine as told by your doctor. °· Keep all doctor visits as told. °Get help right away if: °· You are bleeding, leaking fluid, or pieces of tissue come out of your vagina. °· You have more pain or cramping. °· You keep throwing up (vomiting). °· You have pain when you pee (urinate) or have blood in your pee. °· You have a fever. °· You do not feel your baby moving as much. °· You feel very weak or feel like passing out. °· You have trouble breathing, with or without belly pain. °· You have a very bad headache and belly pain. °· You have fluid leaking from your vagina and belly pain. °· You keep having watery poop (diarrhea). °· Your belly pain does not go away after resting, or the pain gets worse. °This information is not intended to replace advice given to you by your health care provider. Make sure you discuss any questions you have with your health care provider. °Document Released: 07/17/2009 Document Revised: 03/06/2016 Document Reviewed: 02/25/2013 °Elsevier Interactive Patient Education © 2017 Elsevier Inc. ° °

## 2016-07-31 NOTE — MAU Note (Signed)
Last night and this morning, not feeling good, nauseated. This morning, started having cramps in lower abd.  Since Sunday has been going to the bathroom a lot more often (urination), no pain associated.

## 2016-07-31 NOTE — MAU Provider Note (Signed)
MAU PROVIDER NOTE  Chief Complaint:  Abdominal Pain and Nausea   HPI: Marcia Jackson is a 25 y.o. G2P1001 at 2032w3d who presents to maternity admissions reporting abdominal pain located along her lower belly that began this AM.  Describes it as a sharp pain that shoots down into her pelvic area.  Rates it as a 4/10, comes and goes.  Did not take anything at home for the pain.  Has been eating and drinking ok.  Reports nausea since last night but no episodes of vomiting. No urinary symptoms.  Denies diarrhea, fevers, chills.  At appointment last week cervix was checked and was 1 cm.    Denies contractions, leakage of fluid or vaginal bleeding. Good fetal movement.    Pregnancy Course: Late to prenatal care (began at 5 months), rubella non-immune  Past Medical History:  Past Medical History:  Diagnosis Date  . Depression    Past obstetric history: OB History  Gravida Para Term Preterm AB Living  2 1 1     1   SAB TAB Ectopic Multiple Live Births          1    # Outcome Date GA Lbr Len/2nd Weight Sex Delivery Anes PTL Lv  2 Current           1 Term 09/27/09 676w0d  5 lb 13 oz (2.637 kg) M Vag-Spont EPI N LIV     Past Surgical History: Past Surgical History:  Procedure Laterality Date  . NO PAST SURGERIES     Family History: Family History  Problem Relation Age of Onset  . Diabetes Maternal Aunt   . Cancer Maternal Grandmother   . Diabetes Maternal Grandmother    Social History: Social History  Substance Use Topics  . Smoking status: Current Every Day Smoker    Packs/day: 0.25    Types: Cigarettes  . Smokeless tobacco: Never Used  . Alcohol use Yes     Comment: occasionally   Allergies: No Known Allergies  Meds:  Prescriptions Prior to Admission  Medication Sig Dispense Refill Last Dose  . acetaminophen (TYLENOL) 500 MG tablet Take 1,000 mg by mouth every 6 (six) hours as needed for mild pain, moderate pain or headache.    07/30/2016 at 1900  . Prenatal Vit-Fe  Fumarate-FA (PRENATAL MULTIVITAMIN) TABS tablet Take 1 tablet by mouth daily.    07/30/2016 at Unknown time   I have reviewed patient's Past Medical Hx, Surgical Hx, Family Hx, Social Hx, medications and allergies.   ROS:  A comprehensive ROS was negative except per HPI.   Physical Exam   Patient Vitals for the past 24 hrs:  BP Temp Temp src Pulse Resp SpO2 Weight  07/31/16 1101 - - - 82 - 97 % -  07/31/16 0953 (!) 92/54 98 F (36.7 C) Oral 87 16 - 113 lb 6.4 oz (51.4 kg)   Constitutional: Well-developed, well-nourished female in no acute distress.  Cardiovascular: normal rate Respiratory: normal effort GI: Abd soft, slight tenderness along lower quadrants,  gravid appropriate for gestational age. Pos BS x 4 MS: Extremities nontender, no edema, normal ROM Neurologic: Alert and oriented x 4.  GU: Neg CVAT.  Pelvic: NEFG, physiologic discharge, no blood, cervix clean. No CMT.  Cervix 1cm and thick.   Dilation: Fingertip (Ext os, internal os closed) Effacement (%): Thick Cervical Position: Posterior Station: Ballotable Presentation: Undeterminable Exam by:: Sarajane MarekS. Carrera, RNC FHT:  Baseline 150s , moderate variability, accelerations present, no decelerations Contractions: None  Labs: Results  for orders placed or performed during the hospital encounter of 07/31/16 (from the past 24 hour(s))  Urinalysis, Routine w reflex microscopic     Status: Abnormal   Collection Time: 07/31/16 10:25 AM  Result Value Ref Range   Color, Urine YELLOW YELLOW   APPearance HAZY (A) CLEAR   Specific Gravity, Urine 1.019 1.005 - 1.030   pH 6.0 5.0 - 8.0   Glucose, UA NEGATIVE NEGATIVE mg/dL   Hgb urine dipstick NEGATIVE NEGATIVE   Bilirubin Urine NEGATIVE NEGATIVE   Ketones, ur NEGATIVE NEGATIVE mg/dL   Protein, ur NEGATIVE NEGATIVE mg/dL   Nitrite NEGATIVE NEGATIVE   Leukocytes, UA MODERATE (A) NEGATIVE   RBC / HPF 0-5 0 - 5 RBC/hpf   WBC, UA 6-30 0 - 5 WBC/hpf   Bacteria, UA RARE (A) NONE SEEN    Squamous Epithelial / LPF 6-30 (A) NONE SEEN   Mucous PRESENT     Imaging:  [place] [facility] Ob Follow Up  Result Date: 07/10/2016 OBSTETRICAL ULTRASOUND: This exam was performed within a [facility]. The OB US report was generated in the AS system, and faxed to the ordering physician.  This report is available in the [facility] [employer] PACS. See the [facility] Obstetric US report via the Image Link.  MAU Course: Cervical check Po fluids UA  MDM: Plan of care reviewed with patient, including labs and tests ordered and medical treatment.  Assessment: 1. Abdominal pain during pregnancy in third trimester   2. Rh negative, antepartum   3. Rubella non-immune status, antepartum   4. Chlamydia infection affecting pregnancy in third trimester   5. BV (bacterial vaginosis)   6. Late prenatal care affecting pregnancy in second trimester    Plan: IUP at 3398w3d presenting to MAU with lower abdominal pain.  Likely normal abdominal pain of pregnancy.  On monitor baby initially nonreactive in MAU.  However now reactive after abdominal exam and drinking fluids.  Cat I.  No contractions, LOF or signs of labor.  Cervix remains at 1cm, posterior and thick.  UA negative.  Discharge home in stable condition with strict return precautions .  Advised to keep appointment for tomorrow.  Labor precautions and fetal kick counts.   Follow-up Information    [facility]             . Schedule an appointment as soon as possible for a visit.   Why:  If pain not improved.  Contact information: [place]          Allergies as of 07/31/2016   No Known Allergies     Medication List    TAKE these medications   acetaminophen 500 MG tablet Commonly known as:  TYLENOL Take 1,000 mg by mouth every 6 (six) hours as needed for mild pain, moderate pain or headache.   prenatal multivitamin Tabs tablet Take 1 tablet by mouth daily.       Freddrick MarchYashika Amin, MD PGY-1 07/31/2016 11:08 AM  OB  FELLOW DISCHARGE ATTESTATION  I have seen and examined this patient and agree with above documentation in the resident's note.   Ernestina PennaNicholas Modesto Ganoe, MD 11:22 AM

## 2016-08-01 ENCOUNTER — Encounter: Payer: Medicaid Other | Admitting: Obstetrics & Gynecology

## 2016-08-08 ENCOUNTER — Ambulatory Visit (INDEPENDENT_AMBULATORY_CARE_PROVIDER_SITE_OTHER): Payer: Medicaid Other | Admitting: Family Medicine

## 2016-08-08 ENCOUNTER — Encounter: Payer: Medicaid Other | Admitting: Family Medicine

## 2016-08-08 VITALS — BP 118/63 | HR 85 | Wt 117.5 lb

## 2016-08-08 DIAGNOSIS — Z6791 Unspecified blood type, Rh negative: Secondary | ICD-10-CM

## 2016-08-08 DIAGNOSIS — O26899 Other specified pregnancy related conditions, unspecified trimester: Secondary | ICD-10-CM

## 2016-08-08 DIAGNOSIS — O0932 Supervision of pregnancy with insufficient antenatal care, second trimester: Secondary | ICD-10-CM

## 2016-08-08 NOTE — Progress Notes (Signed)
   PRENATAL VISIT NOTE  Subjective:  Marcia Jackson is a 25 y.o. G2P1001 at 5916w4d being seen today for ongoing prenatal care.  She is currently monitored for the following issues for this low-risk pregnancy and has Chlamydia infection affecting pregnancy; BV (bacterial vaginosis); Late prenatal care affecting pregnancy in second trimester; Family history of congenital heart defect; Rh negative, antepartum; and Rubella non-immune status, antepartum on her problem list.  Patient reports no complaints.  Contractions: Irritability. Vag. Bleeding: None.  Movement: Present. Denies leaking of fluid.   The following portions of the patient's history were reviewed and updated as appropriate: allergies, current medications, past family history, past medical history, past social history, past surgical history and problem list. Problem list updated.  Objective:   Vitals:   08/08/16 1141  BP: 118/63  Pulse: 85  Weight: 117 lb 8 oz (53.3 kg)    Fetal Status: Fetal Heart Rate (bpm): 154 Fundal Height: 33 cm Movement: Present  Presentation: Vertex  General:  Alert, oriented and cooperative. Patient is in no acute distress.  Skin: Skin is warm and dry. No rash noted.   Cardiovascular: Normal heart rate noted  Respiratory: Normal respiratory effort, no problems with respiration noted  Abdomen: Soft, gravid, appropriate for gestational age. Pain/Pressure: Present     Pelvic:  Cervical exam deferred        Extremities: Normal range of motion.  Edema: None  Mental Status: Normal mood and affect. Normal behavior. Normal judgment and thought content.   Assessment and Plan:  Pregnancy: G2P1001 at 8216w4d  1. Rh negative, antepartum S/p Rhogam  2. Late prenatal care affecting pregnancy in second trimester Continue routine prenatal care.   Term labor symptoms and general obstetric precautions including but not limited to vaginal bleeding, contractions, leaking of fluid and fetal movement were reviewed  in detail with the patient. Please refer to After Visit Summary for other counseling recommendations.  Return in 1 week (on 08/15/2016).   Reva Boresanya S Simora Dingee, MD

## 2016-08-08 NOTE — Patient Instructions (Signed)
Third Trimester of Pregnancy The third trimester is from week 29 through week 40 (months 7 through 9). The third trimester is a time when the unborn baby (fetus) is growing rapidly. At the end of the ninth month, the fetus is about 20 inches in length and weighs 6-10 pounds. Body changes during your third trimester Your body goes through many changes during pregnancy. The changes vary from woman to woman. During the third trimester:  Your weight will continue to increase. You can expect to gain 25-35 pounds (11-16 kg) by the end of the pregnancy.  You may begin to get stretch marks on your hips, abdomen, and breasts.  You may urinate more often because the fetus is moving lower into your pelvis and pressing on your bladder.  You may develop or continue to have heartburn. This is caused by increased hormones that slow down muscles in the digestive tract.  You may develop or continue to have constipation because increased hormones slow digestion and cause the muscles that push waste through your intestines to relax.  You may develop hemorrhoids. These are swollen veins (varicose veins) in the rectum that can itch or be painful.  You may develop swollen, bulging veins (varicose veins) in your legs.  You may have increased body aches in the pelvis, back, or thighs. This is due to weight gain and increased hormones that are relaxing your joints.  You may have changes in your hair. These can include thickening of your hair, rapid growth, and changes in texture. Some women also have hair loss during or after pregnancy, or hair that feels dry or thin. Your hair will most likely return to normal after your baby is born.  Your breasts will continue to grow and they will continue to become tender. A yellow fluid (colostrum) may leak from your breasts. This is the first milk you are producing for your baby.  Your belly button may stick out.  You may notice more swelling in your hands, face, or  ankles.  You may have increased tingling or numbness in your hands, arms, and legs. The skin on your belly may also feel numb.  You may feel short of breath because of your expanding uterus.  You may have more problems sleeping. This can be caused by the size of your belly, increased need to urinate, and an increase in your body's metabolism.  You may notice the fetus "dropping," or moving lower in your abdomen.  You may have increased vaginal discharge.  Your cervix becomes thin and soft (effaced) near your due date. What to expect at prenatal visits You will have prenatal exams every 2 weeks until week 36. Then you will have weekly prenatal exams. During a routine prenatal visit:  You will be weighed to make sure you and the fetus are growing normally.  Your blood pressure will be taken.  Your abdomen will be measured to track your baby's growth.  The fetal heartbeat will be listened to.  Any test results from the previous visit will be discussed.  You may have a cervical check near your due date to see if you have effaced. At around 36 weeks, your health care provider will check your cervix. At the same time, your health care provider will also perform a test on the secretions of the vaginal tissue. This test is to determine if a type of bacteria, Group B streptococcus, is present. Your health care provider will explain this further. Your health care provider may ask you:    What your birth plan is.  How you are feeling.  If you are feeling the baby move.  If you have had any abnormal symptoms, such as leaking fluid, bleeding, severe headaches, or abdominal cramping.  If you are using any tobacco products, including cigarettes, chewing tobacco, and electronic cigarettes.  If you have any questions. Other tests or screenings that may be performed during your third trimester include:  Blood tests that check for low iron levels (anemia).  Fetal testing to check the health,  activity level, and growth of the fetus. Testing is done if you have certain medical conditions or if there are problems during the pregnancy.  Nonstress test (NST). This test checks the health of your baby to make sure there are no signs of problems, such as the baby not getting enough oxygen. During this test, a belt is placed around your belly. The baby is made to move, and its heart rate is monitored during movement. What is false labor? False labor is a condition in which you feel small, irregular tightenings of the muscles in the womb (contractions) that eventually go away. These are called Braxton Hicks contractions. Contractions may last for hours, days, or even weeks before true labor sets in. If contractions come at regular intervals, become more frequent, increase in intensity, or become painful, you should see your health care provider. What are the signs of labor?  Abdominal cramps.  Regular contractions that start at 10 minutes apart and become stronger and more frequent with time.  Contractions that start on the top of the uterus and spread down to the lower abdomen and back.  Increased pelvic pressure and dull back pain.  A watery or bloody mucus discharge that comes from the vagina.  Leaking of amniotic fluid. This is also known as your "water breaking." It could be a slow trickle or a gush. Let your doctor know if it has a color or strange odor. If you have any of these signs, call your health care provider right away, even if it is before your due date. Follow these instructions at home: Eating and drinking  Continue to eat regular, healthy meals.  Do not eat:  Raw meat or meat spreads.  Unpasteurized milk or cheese.  Unpasteurized juice.  Store-made salad.  Refrigerated smoked seafood.  Hot dogs or deli meat, unless they are piping hot.  More than 6 ounces of albacore tuna a week.  Shark, swordfish, king mackerel, or tile fish.  Store-made salads.  Raw  sprouts, such as mung bean or alfalfa sprouts.  Take prenatal vitamins as told by your health care provider.  Take 1000 mg of calcium daily as told by your health care provider.  If you develop constipation:  Take over-the-counter or prescription medicines.  Drink enough fluid to keep your urine clear or pale yellow.  Eat foods that are high in fiber, such as fresh fruits and vegetables, whole grains, and beans.  Limit foods that are high in fat and processed sugars, such as fried and sweet foods. Activity  Exercise only as directed by your health care provider. Healthy pregnant women should aim for 2 hours and 30 minutes of moderate exercise per week. If you experience any pain or discomfort while exercising, stop.  Avoid heavy lifting.  Do not exercise in extreme heat or humidity, or at high altitudes.  Wear low-heel, comfortable shoes.  Practice good posture.  Do not travel far distances unless it is absolutely necessary and only with the approval   of your health care provider.  Wear your seat belt at all times while in a car, on a bus, or on a plane.  Take frequent breaks and rest with your legs elevated if you have leg cramps or low back pain.  Do not use hot tubs, steam rooms, or saunas.  You may continue to have sex unless your health care provider tells you otherwise. Lifestyle  Do not use any products that contain nicotine or tobacco, such as cigarettes and e-cigarettes. If you need help quitting, ask your health care provider.  Do not drink alcohol.  Do not use any medicinal herbs or unprescribed drugs. These chemicals affect the formation and growth of the baby.  If you develop varicose veins:  Wear support pantyhose or compression stockings as told by your healthcare provider.  Elevate your feet for 15 minutes, 3-4 times a day.  Wear a supportive maternity bra to help with breast tenderness. General instructions  Take over-the-counter and prescription  medicines only as told by your health care provider. There are medicines that are either safe or unsafe to take during pregnancy.  Take warm sitz baths to soothe any pain or discomfort caused by hemorrhoids. Use hemorrhoid cream or witch hazel if your health care provider approves.  Avoid cat litter boxes and soil used by cats. These carry germs that can cause birth defects in the baby. If you have a cat, ask someone to clean the litter box for you.  To prepare for the arrival of your baby:  Take prenatal classes to understand, practice, and ask questions about the labor and delivery.  Make a trial run to the hospital.  Visit the hospital and tour the maternity area.  Arrange for maternity or paternity leave through employers.  Arrange for family and friends to take care of pets while you are in the hospital.  Purchase a rear-facing car seat and make sure you know how to install it in your car.  Pack your hospital bag.  Prepare the baby's nursery. Make sure to remove all pillows and stuffed animals from the baby's crib to prevent suffocation.  Visit your dentist if you have not gone during your pregnancy. Use a soft toothbrush to brush your teeth and be gentle when you floss.  Keep all prenatal follow-up visits as told by your health care provider. This is important. Contact a health care provider if:  You are unsure if you are in labor or if your water has broken.  You become dizzy.  You have mild pelvic cramps, pelvic pressure, or nagging pain in your abdominal area.  You have lower back pain.  You have persistent nausea, vomiting, or diarrhea.  You have an unusual or bad smelling vaginal discharge.  You have pain when you urinate. Get help right away if:  You have a fever.  You are leaking fluid from your vagina.  You have spotting or bleeding from your vagina.  You have severe abdominal pain or cramping.  You have rapid weight loss or weight gain.  You have  shortness of breath with chest pain.  You notice sudden or extreme swelling of your face, hands, ankles, feet, or legs.  Your baby makes fewer than 10 movements in 2 hours.  You have severe headaches that do not go away with medicine.  You have vision changes. Summary  The third trimester is from week 29 through week 40, months 7 through 9. The third trimester is a time when the unborn baby (fetus)   is growing rapidly.  During the third trimester, your discomfort may increase as you and your baby continue to gain weight. You may have abdominal, leg, and back pain, sleeping problems, and an increased need to urinate.  During the third trimester your breasts will keep growing and they will continue to become tender. A yellow fluid (colostrum) may leak from your breasts. This is the first milk you are producing for your baby.  False labor is a condition in which you feel small, irregular tightenings of the muscles in the womb (contractions) that eventually go away. These are called Braxton Hicks contractions. Contractions may last for hours, days, or even weeks before true labor sets in.  Signs of labor can include: abdominal cramps; regular contractions that start at 10 minutes apart and become stronger and more frequent with time; watery or bloody mucus discharge that comes from the vagina; increased pelvic pressure and dull back pain; and leaking of amniotic fluid. This information is not intended to replace advice given to you by your health care provider. Make sure you discuss any questions you have with your health care provider. Document Released: 07/23/2001 Document Revised: 01/04/2016 Document Reviewed: 09/29/2012 [employer] Interactive Patient Education  2017 [employer]   Breastfeeding Deciding to breastfeed is one of the best choices you can make for you and your baby. A change in hormones during pregnancy causes your breast tissue to grow and increases the number and size of your  milk ducts. These hormones also allow proteins, sugars, and fats from your blood supply to make breast milk in your milk-producing glands. Hormones prevent breast milk from being released before your baby is born as well as prompt milk flow after birth. Once breastfeeding has begun, thoughts of your baby, as well as his or her sucking or crying, can stimulate the release of milk from your milk-producing glands. Benefits of breastfeeding For Your Baby  Your first milk (colostrum) helps your baby's digestive system function better.  There are antibodies in your milk that help your baby fight off infections.  Your baby has a lower incidence of asthma, allergies, and sudden infant death syndrome.  The nutrients in breast milk are better for your baby than infant formulas and are designed uniquely for your baby's needs.  Breast milk improves your baby's brain development.  Your baby is less likely to develop other conditions, such as childhood obesity, asthma, or type 2 diabetes mellitus. For You  Breastfeeding helps to create a very special bond between you and your baby.  Breastfeeding is convenient. Breast milk is always available at the correct temperature and costs nothing.  Breastfeeding helps to burn calories and helps you lose the weight gained during pregnancy.  Breastfeeding makes your uterus contract to its prepregnancy size faster and slows bleeding (lochia) after you give birth.  Breastfeeding helps to lower your risk of developing type 2 diabetes mellitus, osteoporosis, and breast or ovarian cancer later in life. Signs that your baby is hungry Early Signs of Hunger  Increased alertness or activity.  Stretching.  Movement of the head from side to side.  Movement of the head and opening of the mouth when the corner of the mouth or cheek is stroked (rooting).  Increased sucking sounds, smacking lips, cooing, sighing, or squeaking.  Hand-to-mouth movements.  Increased  sucking of fingers or hands. Late Signs of Hunger  Fussing.  Intermittent crying. Extreme Signs of Hunger  Signs of extreme hunger will require calming and consoling before your baby will   be able to breastfeed successfully. Do not wait for the following signs of extreme hunger to occur before you initiate breastfeeding:  Restlessness.  A loud, strong cry.  Screaming. Breastfeeding basics  Breastfeeding Initiation  Find a comfortable place to sit or lie down, with your neck and back well supported.  Place a pillow or rolled up blanket under your baby to bring him or her to the level of your breast (if you are seated). Nursing pillows are specially designed to help support your arms and your baby while you breastfeed.  Make sure that your baby's abdomen is facing your abdomen.  Gently massage your breast. With your fingertips, massage from your chest wall toward your nipple in a circular motion. This encourages milk flow. You may need to continue this action during the feeding if your milk flows slowly.  Support your breast with 4 fingers underneath and your thumb above your nipple. Make sure your fingers are well away from your nipple and your baby's mouth.  Stroke your baby's lips gently with your finger or nipple.  When your baby's mouth is open wide enough, quickly bring your baby to your breast, placing your entire nipple and as much of the colored area around your nipple (areola) as possible into your baby's mouth.  More areola should be visible above your baby's upper lip than below the lower lip.  Your baby's tongue should be between his or her lower gum and your breast.  Ensure that your baby's mouth is correctly positioned around your nipple (latched). Your baby's lips should create a seal on your breast and be turned out (everted).  It is common for your baby to suck about 2-3 minutes in order to start the flow of breast milk. Latching  Teaching your baby how to latch  on to your breast properly is very important. An improper latch can cause nipple pain and decreased milk supply for you and poor weight gain in your baby. Also, if your baby is not latched onto your nipple properly, he or she may swallow some air during feeding. This can make your baby fussy. Burping your baby when you switch breasts during the feeding can help to get rid of the air. However, teaching your baby to latch on properly is still the best way to prevent fussiness from swallowing air while breastfeeding. Signs that your baby has successfully latched on to your nipple:  Silent tugging or silent sucking, without causing you pain.  Swallowing heard between every 3-4 sucks.  Muscle movement above and in front of his or her ears while sucking. Signs that your baby has not successfully latched on to nipple:  Sucking sounds or smacking sounds from your baby while breastfeeding.  Nipple pain. If you think your baby has not latched on correctly, slip your finger into the corner of your baby's mouth to break the suction and place it between your baby's gums. Attempt breastfeeding initiation again. Signs of Successful Breastfeeding  Signs from your baby:  A gradual decrease in the number of sucks or complete cessation of sucking.  Falling asleep.  Relaxation of his or her body.  Retention of a small amount of milk in his or her mouth.  Letting go of your breast by himself or herself. Signs from you:  Breasts that have increased in firmness, weight, and size 1-3 hours after feeding.  Breasts that are softer immediately after breastfeeding.  Increased milk volume, as well as a change in milk consistency and color by   the fifth day of breastfeeding.  Nipples that are not sore, cracked, or bleeding. Signs That Your Baby is Getting Enough Milk  Wetting at least 1-2 diapers during the first 24 hours after birth.  Wetting at least 5-6 diapers every 24 hours for the first week after  birth. The urine should be clear or pale yellow by 5 days after birth.  Wetting 6-8 diapers every 24 hours as your baby continues to grow and develop.  At least 3 stools in a 24-hour period by age 5 days. The stool should be soft and yellow.  At least 3 stools in a 24-hour period by age 7 days. The stool should be seedy and yellow.  No loss of weight greater than 10% of birth weight during the first 3 days of age.  Average weight gain of 4-7 ounces (113-198 g) per week after age 4 days.  Consistent daily weight gain by age 5 days, without weight loss after the age of 2 weeks. After a feeding, your baby may spit up a small amount. This is common. Breastfeeding frequency and duration Frequent feeding will help you make more milk and can prevent sore nipples and breast engorgement. Breastfeed when you feel the need to reduce the fullness of your breasts or when your baby shows signs of hunger. This is called "breastfeeding on demand." Avoid introducing a pacifier to your baby while you are working to establish breastfeeding (the first 4-6 weeks after your baby is born). After this time you may choose to use a pacifier. Research has shown that pacifier use during the first year of a baby's life decreases the risk of sudden infant death syndrome (SIDS). Allow your baby to feed on each breast as long as he or she wants. Breastfeed until your baby is finished feeding. When your baby unlatches or falls asleep while feeding from the first breast, offer the second breast. Because newborns are often sleepy in the first few weeks of life, you may need to awaken your baby to get him or her to feed. Breastfeeding times will vary from baby to baby. However, the following rules can serve as a guide to help you ensure that your baby is properly fed:  Newborns (babies 4 weeks of age or younger) may breastfeed every 1-3 hours.  Newborns should not go longer than 3 hours during the day or 5 hours during the night  without breastfeeding.  You should breastfeed your baby a minimum of 8 times in a 24-hour period until you begin to introduce solid foods to your baby at around 6 months of age. Breast milk pumping Pumping and storing breast milk allows you to ensure that your baby is exclusively fed your breast milk, even at times when you are unable to breastfeed. This is especially important if you are going back to work while you are still breastfeeding or when you are not able to be present during feedings. Your lactation consultant can give you guidelines on how long it is safe to store breast milk. A breast pump is a machine that allows you to pump milk from your breast into a sterile bottle. The pumped breast milk can then be stored in a refrigerator or freezer. Some breast pumps are operated by hand, while others use electricity. Ask your lactation consultant which type will work best for you. Breast pumps can be purchased, but some hospitals and breastfeeding support groups lease breast pumps on a monthly basis. A lactation consultant can teach you how to   hand express breast milk, if you prefer not to use a pump. Caring for your breasts while you breastfeed Nipples can become dry, cracked, and sore while breastfeeding. The following recommendations can help keep your breasts moisturized and healthy:  Avoid using soap on your nipples.  Wear a supportive bra. Although not required, special nursing bras and tank tops are designed to allow access to your breasts for breastfeeding without taking off your entire bra or top. Avoid wearing underwire-style bras or extremely tight bras.  Air dry your nipples for 3-4minutes after each feeding.  Use only cotton bra pads to absorb leaked breast milk. Leaking of breast milk between feedings is normal.  Use lanolin on your nipples after breastfeeding. Lanolin helps to maintain your skin's normal moisture barrier. If you use pure lanolin, you do not need to wash it off  before feeding your baby again. Pure lanolin is not toxic to your baby. You may also hand express a few drops of breast milk and gently massage that milk into your nipples and allow the milk to air dry. In the first few weeks after giving birth, some women experience extremely full breasts (engorgement). Engorgement can make your breasts feel heavy, warm, and tender to the touch. Engorgement peaks within 3-5 days after you give birth. The following recommendations can help ease engorgement:  Completely empty your breasts while breastfeeding or pumping. You may want to start by applying warm, moist heat (in the shower or with warm water-soaked hand towels) just before feeding or pumping. This increases circulation and helps the milk flow. If your baby does not completely empty your breasts while breastfeeding, pump any extra milk after he or she is finished.  Wear a snug bra (nursing or regular) or tank top for 1-2 days to signal your body to slightly decrease milk production.  Apply ice packs to your breasts, unless this is too uncomfortable for you.  Make sure that your baby is latched on and positioned properly while breastfeeding. If engorgement persists after 48 hours of following these recommendations, contact your health care provider or a lactation consultant. Overall health care recommendations while breastfeeding  Eat healthy foods. Alternate between meals and snacks, eating 3 of each per day. Because what you eat affects your breast milk, some of the foods may make your baby more irritable than usual. Avoid eating these foods if you are sure that they are negatively affecting your baby.  Drink milk, fruit juice, and water to satisfy your thirst (about 10 glasses a day).  Rest often, relax, and continue to take your prenatal vitamins to prevent fatigue, stress, and anemia.  Continue breast self-awareness checks.  Avoid chewing and smoking tobacco. Chemicals from cigarettes that pass  into breast milk and exposure to secondhand smoke may harm your baby.  Avoid alcohol and drug use, including marijuana. Some medicines that may be harmful to your baby can pass through breast milk. It is important to ask your health care provider before taking any medicine, including all over-the-counter and prescription medicine as well as vitamin and herbal supplements. It is possible to become pregnant while breastfeeding. If birth control is desired, ask your health care provider about options that will be safe for your baby. Contact a health care provider if:  You feel like you want to stop breastfeeding or have become frustrated with breastfeeding.  You have painful breasts or nipples.  Your nipples are cracked or bleeding.  Your breasts are red, tender, or warm.  You have   a swollen area on either breast.  You have a fever or chills.  You have nausea or vomiting.  You have drainage other than breast milk from your nipples.  Your breasts do not become full before feedings by the fifth day after you give birth.  You feel sad and depressed.  Your baby is too sleepy to eat well.  Your baby is having trouble sleeping.  Your baby is wetting less than 3 diapers in a 24-hour period.  Your baby has less than 3 stools in a 24-hour period.  Your baby's skin or the white part of his or her eyes becomes yellow.  Your baby is not gaining weight by 5 days of age. Get help right away if:  Your baby is overly tired (lethargic) and does not want to wake up and feed.  Your baby develops an unexplained fever. This information is not intended to replace advice given to you by your health care provider. Make sure you discuss any questions you have with your health care provider. Document Released: 07/29/2005 Document Revised: 01/10/2016 Document Reviewed: 01/20/2013 Elsevier Interactive Patient Education  2017 [employer] Inc.  

## 2016-08-12 NOTE — L&D Delivery Note (Signed)
26 y.o. G2P1001 at 5744w6d delivered a viable female infant in cephalic, LOA position. Anterior shoulder delivered with ease. 60 sec delayed cord clamping. Cord clamped x2 and cut. Placenta delivered spontaneously intact, with 3VC. Fundus firm on exam with massage and pitocin. Good hemostasis noted.  Laceration: None Suture: none Good hemostasis noted. EBL 150cc  Mom and baby recovering in LDR.    Apgars: 9/9 Weight: pending  Dinora Hemm L. Myrtie SomanWarden, MD [facility] Resident PGY-1 08/20/2016 4:48 PM

## 2016-08-15 ENCOUNTER — Ambulatory Visit (INDEPENDENT_AMBULATORY_CARE_PROVIDER_SITE_OTHER): Payer: Medicaid Other | Admitting: Family Medicine

## 2016-08-15 VITALS — BP 100/60 | HR 85 | Wt 117.0 lb

## 2016-08-15 DIAGNOSIS — Z3483 Encounter for supervision of other normal pregnancy, third trimester: Secondary | ICD-10-CM

## 2016-08-15 DIAGNOSIS — Z283 Underimmunization status: Secondary | ICD-10-CM

## 2016-08-15 DIAGNOSIS — O09899 Supervision of other high risk pregnancies, unspecified trimester: Secondary | ICD-10-CM

## 2016-08-15 DIAGNOSIS — O9989 Other specified diseases and conditions complicating pregnancy, childbirth and the puerperium: Secondary | ICD-10-CM

## 2016-08-15 DIAGNOSIS — O26899 Other specified pregnancy related conditions, unspecified trimester: Principal | ICD-10-CM

## 2016-08-15 DIAGNOSIS — Z6791 Unspecified blood type, Rh negative: Secondary | ICD-10-CM

## 2016-08-15 NOTE — Progress Notes (Signed)
   PRENATAL VISIT NOTE  Subjective:  Marcia Jackson is a 26 y.o. G2P1001 at 15w4dbeing seen today for ongoing prenatal care.  She is currently monitored for the following issues for this low-risk pregnancy and has Chlamydia infection affecting pregnancy; BV (bacterial vaginosis); Supervision of normal intrauterine pregnancy in multigravida; Family history of congenital heart defect; Rh negative, antepartum; and Rubella non-immune status, antepartum on her problem list.  Patient reports no complaints.  Contractions: Irritability. Vag. Bleeding: None.  Movement: Present. Denies leaking of fluid.   The following portions of the patient's history were reviewed and updated as appropriate: allergies, current medications, past family history, past medical history, past social history, past surgical history and problem list. Problem list updated.  Objective:   Vitals:   08/15/16 1541  BP: 100/60  Pulse: 85  Weight: 117 lb (53.1 kg)    Fetal Status: Fetal Heart Rate (bpm): 155 Fundal Height: 34 cm Movement: Present  Presentation: Vertex  General:  Alert, oriented and cooperative. Patient is in no acute distress.  Skin: Skin is warm and dry. No rash noted.   Cardiovascular: Normal heart rate noted  Respiratory: Normal respiratory effort, no problems with respiration noted  Abdomen: Soft, gravid, appropriate for gestational age. Pain/Pressure: Present     Pelvic:  Cervical exam performed Dilation: 1.5 Effacement (%): 70 Station: -2  Extremities: Normal range of motion.  Edema: None  Mental Status: Normal mood and affect. Normal behavior. Normal judgment and thought content.   Assessment and Plan:  Pregnancy: G2P1001 at 369w4d1. Rh negative, antepartum S/p Rhogam  2. Rubella non-immune status, antepartum Needs MMR pp  3. Supervision of normal intrauterine pregnancy in multigravida in third trimester Continue routine prenatal care. Membranes stripped  Term labor symptoms and general  obstetric precautions including but not limited to vaginal bleeding, contractions, leaking of fluid and fetal movement were reviewed in detail with the patient. Please refer to After Visit Summary for other counseling recommendations.  Return in 1 week (on 08/22/2016).   TaDonnamae JudeMD

## 2016-08-15 NOTE — Patient Instructions (Signed)
Third Trimester of Pregnancy The third trimester is from week 29 through week 40 (months 7 through 9). The third trimester is a time when the unborn baby (fetus) is growing rapidly. At the end of the ninth month, the fetus is about 20 inches in length and weighs 6-10 pounds. Body changes during your third trimester Your body goes through many changes during pregnancy. The changes vary from woman to woman. During the third trimester:  Your weight will continue to increase. You can expect to gain 25-35 pounds (11-16 kg) by the end of the pregnancy.  You may begin to get stretch marks on your hips, abdomen, and breasts.  You may urinate more often because the fetus is moving lower into your pelvis and pressing on your bladder.  You may develop or continue to have heartburn. This is caused by increased hormones that slow down muscles in the digestive tract.  You may develop or continue to have constipation because increased hormones slow digestion and cause the muscles that push waste through your intestines to relax.  You may develop hemorrhoids. These are swollen veins (varicose veins) in the rectum that can itch or be painful.  You may develop swollen, bulging veins (varicose veins) in your legs.  You may have increased body aches in the pelvis, back, or thighs. This is due to weight gain and increased hormones that are relaxing your joints.  You may have changes in your hair. These can include thickening of your hair, rapid growth, and changes in texture. Some women also have hair loss during or after pregnancy, or hair that feels dry or thin. Your hair will most likely return to normal after your baby is born.  Your breasts will continue to grow and they will continue to become tender. A yellow fluid (colostrum) may leak from your breasts. This is the first milk you are producing for your baby.  Your belly button may stick out.  You may notice more swelling in your hands, face, or  ankles.  You may have increased tingling or numbness in your hands, arms, and legs. The skin on your belly may also feel numb.  You may feel short of breath because of your expanding uterus.  You may have more problems sleeping. This can be caused by the size of your belly, increased need to urinate, and an increase in your body's metabolism.  You may notice the fetus "dropping," or moving lower in your abdomen.  You may have increased vaginal discharge.  Your cervix becomes thin and soft (effaced) near your due date. What to expect at prenatal visits You will have prenatal exams every 2 weeks until week 36. Then you will have weekly prenatal exams. During a routine prenatal visit:  You will be weighed to make sure you and the fetus are growing normally.  Your blood pressure will be taken.  Your abdomen will be measured to track your baby's growth.  The fetal heartbeat will be listened to.  Any test results from the previous visit will be discussed.  You may have a cervical check near your due date to see if you have effaced. At around 36 weeks, your health care provider will check your cervix. At the same time, your health care provider will also perform a test on the secretions of the vaginal tissue. This test is to determine if a type of bacteria, Group B streptococcus, is present. Your health care provider will explain this further. Your health care provider may ask you:    What your birth plan is.  How you are feeling.  If you are feeling the baby move.  If you have had any abnormal symptoms, such as leaking fluid, bleeding, severe headaches, or abdominal cramping.  If you are using any tobacco products, including cigarettes, chewing tobacco, and electronic cigarettes.  If you have any questions. Other tests or screenings that may be performed during your third trimester include:  Blood tests that check for low iron levels (anemia).  Fetal testing to check the health,  activity level, and growth of the fetus. Testing is done if you have certain medical conditions or if there are problems during the pregnancy.  Nonstress test (NST). This test checks the health of your baby to make sure there are no signs of problems, such as the baby not getting enough oxygen. During this test, a belt is placed around your belly. The baby is made to move, and its heart rate is monitored during movement. What is false labor? False labor is a condition in which you feel small, irregular tightenings of the muscles in the womb (contractions) that eventually go away. These are called Braxton Hicks contractions. Contractions may last for hours, days, or even weeks before true labor sets in. If contractions come at regular intervals, become more frequent, increase in intensity, or become painful, you should see your health care provider. What are the signs of labor?  Abdominal cramps.  Regular contractions that start at 10 minutes apart and become stronger and more frequent with time.  Contractions that start on the top of the uterus and spread down to the lower abdomen and back.  Increased pelvic pressure and dull back pain.  A watery or bloody mucus discharge that comes from the vagina.  Leaking of amniotic fluid. This is also known as your "water breaking." It could be a slow trickle or a gush. Let your doctor know if it has a color or strange odor. If you have any of these signs, call your health care provider right away, even if it is before your due date. Follow these instructions at home: Eating and drinking  Continue to eat regular, healthy meals.  Do not eat:  Raw meat or meat spreads.  Unpasteurized milk or cheese.  Unpasteurized juice.  Store-made salad.  Refrigerated smoked seafood.  Hot dogs or deli meat, unless they are piping hot.  More than 6 ounces of albacore tuna a week.  Shark, swordfish, king mackerel, or tile fish.  Store-made salads.  Raw  sprouts, such as mung bean or alfalfa sprouts.  Take prenatal vitamins as told by your health care provider.  Take 1000 mg of calcium daily as told by your health care provider.  If you develop constipation:  Take over-the-counter or prescription medicines.  Drink enough fluid to keep your urine clear or pale yellow.  Eat foods that are high in fiber, such as fresh fruits and vegetables, whole grains, and beans.  Limit foods that are high in fat and processed sugars, such as fried and sweet foods. Activity  Exercise only as directed by your health care provider. Healthy pregnant women should aim for 2 hours and 30 minutes of moderate exercise per week. If you experience any pain or discomfort while exercising, stop.  Avoid heavy lifting.  Do not exercise in extreme heat or humidity, or at high altitudes.  Wear low-heel, comfortable shoes.  Practice good posture.  Do not travel far distances unless it is absolutely necessary and only with the approval   of your health care provider.  Wear your seat belt at all times while in a car, on a bus, or on a plane.  Take frequent breaks and rest with your legs elevated if you have leg cramps or low back pain.  Do not use hot tubs, steam rooms, or saunas.  You may continue to have sex unless your health care provider tells you otherwise. Lifestyle  Do not use any products that contain nicotine or tobacco, such as cigarettes and e-cigarettes. If you need help quitting, ask your health care provider.  Do not drink alcohol.  Do not use any medicinal herbs or unprescribed drugs. These chemicals affect the formation and growth of the baby.  If you develop varicose veins:  Wear support pantyhose or compression stockings as told by your healthcare provider.  Elevate your feet for 15 minutes, 3-4 times a day.  Wear a supportive maternity bra to help with breast tenderness. General instructions  Take over-the-counter and prescription  medicines only as told by your health care provider. There are medicines that are either safe or unsafe to take during pregnancy.  Take warm sitz baths to soothe any pain or discomfort caused by hemorrhoids. Use hemorrhoid cream or witch hazel if your health care provider approves.  Avoid cat litter boxes and soil used by cats. These carry germs that can cause birth defects in the baby. If you have a cat, ask someone to clean the litter box for you.  To prepare for the arrival of your baby:  Take prenatal classes to understand, practice, and ask questions about the labor and delivery.  Make a trial run to the hospital.  Visit the hospital and tour the maternity area.  Arrange for maternity or paternity leave through employers.  Arrange for family and friends to take care of pets while you are in the hospital.  Purchase a rear-facing car seat and make sure you know how to install it in your car.  Pack your hospital bag.  Prepare the baby's nursery. Make sure to remove all pillows and stuffed animals from the baby's crib to prevent suffocation.  Visit your dentist if you have not gone during your pregnancy. Use a soft toothbrush to brush your teeth and be gentle when you floss.  Keep all prenatal follow-up visits as told by your health care provider. This is important. Contact a health care provider if:  You are unsure if you are in labor or if your water has broken.  You become dizzy.  You have mild pelvic cramps, pelvic pressure, or nagging pain in your abdominal area.  You have lower back pain.  You have persistent nausea, vomiting, or diarrhea.  You have an unusual or bad smelling vaginal discharge.  You have pain when you urinate. Get help right away if:  You have a fever.  You are leaking fluid from your vagina.  You have spotting or bleeding from your vagina.  You have severe abdominal pain or cramping.  You have rapid weight loss or weight gain.  You have  shortness of breath with chest pain.  You notice sudden or extreme swelling of your face, hands, ankles, feet, or legs.  Your baby makes fewer than 10 movements in 2 hours.  You have severe headaches that do not go away with medicine.  You have vision changes. Summary  The third trimester is from week 29 through week 40, months 7 through 9. The third trimester is a time when the unborn baby (fetus)   is growing rapidly.  During the third trimester, your discomfort may increase as you and your baby continue to gain weight. You may have abdominal, leg, and back pain, sleeping problems, and an increased need to urinate.  During the third trimester your breasts will keep growing and they will continue to become tender. A yellow fluid (colostrum) may leak from your breasts. This is the first milk you are producing for your baby.  False labor is a condition in which you feel small, irregular tightenings of the muscles in the womb (contractions) that eventually go away. These are called Braxton Hicks contractions. Contractions may last for hours, days, or even weeks before true labor sets in.  Signs of labor can include: abdominal cramps; regular contractions that start at 10 minutes apart and become stronger and more frequent with time; watery or bloody mucus discharge that comes from the vagina; increased pelvic pressure and dull back pain; and leaking of amniotic fluid. This information is not intended to replace advice given to you by your health care provider. Make sure you discuss any questions you have with your health care provider. Document Released: 07/23/2001 Document Revised: 01/04/2016 Document Reviewed: 09/29/2012 [employer] Interactive Patient Education  2017 [employer]   Breastfeeding Deciding to breastfeed is one of the best choices you can make for you and your baby. A change in hormones during pregnancy causes your breast tissue to grow and increases the number and size of your  milk ducts. These hormones also allow proteins, sugars, and fats from your blood supply to make breast milk in your milk-producing glands. Hormones prevent breast milk from being released before your baby is born as well as prompt milk flow after birth. Once breastfeeding has begun, thoughts of your baby, as well as his or her sucking or crying, can stimulate the release of milk from your milk-producing glands. Benefits of breastfeeding For Your Baby  Your first milk (colostrum) helps your baby's digestive system function better.  There are antibodies in your milk that help your baby fight off infections.  Your baby has a lower incidence of asthma, allergies, and sudden infant death syndrome.  The nutrients in breast milk are better for your baby than infant formulas and are designed uniquely for your baby's needs.  Breast milk improves your baby's brain development.  Your baby is less likely to develop other conditions, such as childhood obesity, asthma, or type 2 diabetes mellitus. For You  Breastfeeding helps to create a very special bond between you and your baby.  Breastfeeding is convenient. Breast milk is always available at the correct temperature and costs nothing.  Breastfeeding helps to burn calories and helps you lose the weight gained during pregnancy.  Breastfeeding makes your uterus contract to its prepregnancy size faster and slows bleeding (lochia) after you give birth.  Breastfeeding helps to lower your risk of developing type 2 diabetes mellitus, osteoporosis, and breast or ovarian cancer later in life. Signs that your baby is hungry Early Signs of Hunger  Increased alertness or activity.  Stretching.  Movement of the head from side to side.  Movement of the head and opening of the mouth when the corner of the mouth or cheek is stroked (rooting).  Increased sucking sounds, smacking lips, cooing, sighing, or squeaking.  Hand-to-mouth movements.  Increased  sucking of fingers or hands. Late Signs of Hunger  Fussing.  Intermittent crying. Extreme Signs of Hunger  Signs of extreme hunger will require calming and consoling before your baby will   be able to breastfeed successfully. Do not wait for the following signs of extreme hunger to occur before you initiate breastfeeding:  Restlessness.  A loud, strong cry.  Screaming. Breastfeeding basics  Breastfeeding Initiation  Find a comfortable place to sit or lie down, with your neck and back well supported.  Place a pillow or rolled up blanket under your baby to bring him or her to the level of your breast (if you are seated). Nursing pillows are specially designed to help support your arms and your baby while you breastfeed.  Make sure that your baby's abdomen is facing your abdomen.  Gently massage your breast. With your fingertips, massage from your chest wall toward your nipple in a circular motion. This encourages milk flow. You may need to continue this action during the feeding if your milk flows slowly.  Support your breast with 4 fingers underneath and your thumb above your nipple. Make sure your fingers are well away from your nipple and your baby's mouth.  Stroke your baby's lips gently with your finger or nipple.  When your baby's mouth is open wide enough, quickly bring your baby to your breast, placing your entire nipple and as much of the colored area around your nipple (areola) as possible into your baby's mouth.  More areola should be visible above your baby's upper lip than below the lower lip.  Your baby's tongue should be between his or her lower gum and your breast.  Ensure that your baby's mouth is correctly positioned around your nipple (latched). Your baby's lips should create a seal on your breast and be turned out (everted).  It is common for your baby to suck about 2-3 minutes in order to start the flow of breast milk. Latching  Teaching your baby how to latch  on to your breast properly is very important. An improper latch can cause nipple pain and decreased milk supply for you and poor weight gain in your baby. Also, if your baby is not latched onto your nipple properly, he or she may swallow some air during feeding. This can make your baby fussy. Burping your baby when you switch breasts during the feeding can help to get rid of the air. However, teaching your baby to latch on properly is still the best way to prevent fussiness from swallowing air while breastfeeding. Signs that your baby has successfully latched on to your nipple:  Silent tugging or silent sucking, without causing you pain.  Swallowing heard between every 3-4 sucks.  Muscle movement above and in front of his or her ears while sucking. Signs that your baby has not successfully latched on to nipple:  Sucking sounds or smacking sounds from your baby while breastfeeding.  Nipple pain. If you think your baby has not latched on correctly, slip your finger into the corner of your baby's mouth to break the suction and place it between your baby's gums. Attempt breastfeeding initiation again. Signs of Successful Breastfeeding  Signs from your baby:  A gradual decrease in the number of sucks or complete cessation of sucking.  Falling asleep.  Relaxation of his or her body.  Retention of a small amount of milk in his or her mouth.  Letting go of your breast by himself or herself. Signs from you:  Breasts that have increased in firmness, weight, and size 1-3 hours after feeding.  Breasts that are softer immediately after breastfeeding.  Increased milk volume, as well as a change in milk consistency and color by   the fifth day of breastfeeding.  Nipples that are not sore, cracked, or bleeding. Signs That Your Baby is Getting Enough Milk  Wetting at least 1-2 diapers during the first 24 hours after birth.  Wetting at least 5-6 diapers every 24 hours for the first week after  birth. The urine should be clear or pale yellow by 5 days after birth.  Wetting 6-8 diapers every 24 hours as your baby continues to grow and develop.  At least 3 stools in a 24-hour period by age 5 days. The stool should be soft and yellow.  At least 3 stools in a 24-hour period by age 7 days. The stool should be seedy and yellow.  No loss of weight greater than 10% of birth weight during the first 3 days of age.  Average weight gain of 4-7 ounces (113-198 g) per week after age 4 days.  Consistent daily weight gain by age 5 days, without weight loss after the age of 2 weeks. After a feeding, your baby may spit up a small amount. This is common. Breastfeeding frequency and duration Frequent feeding will help you make more milk and can prevent sore nipples and breast engorgement. Breastfeed when you feel the need to reduce the fullness of your breasts or when your baby shows signs of hunger. This is called "breastfeeding on demand." Avoid introducing a pacifier to your baby while you are working to establish breastfeeding (the first 4-6 weeks after your baby is born). After this time you may choose to use a pacifier. Research has shown that pacifier use during the first year of a baby's life decreases the risk of sudden infant death syndrome (SIDS). Allow your baby to feed on each breast as long as he or she wants. Breastfeed until your baby is finished feeding. When your baby unlatches or falls asleep while feeding from the first breast, offer the second breast. Because newborns are often sleepy in the first few weeks of life, you may need to awaken your baby to get him or her to feed. Breastfeeding times will vary from baby to baby. However, the following rules can serve as a guide to help you ensure that your baby is properly fed:  Newborns (babies 4 weeks of age or younger) may breastfeed every 1-3 hours.  Newborns should not go longer than 3 hours during the day or 5 hours during the night  without breastfeeding.  You should breastfeed your baby a minimum of 8 times in a 24-hour period until you begin to introduce solid foods to your baby at around 6 months of age. Breast milk pumping Pumping and storing breast milk allows you to ensure that your baby is exclusively fed your breast milk, even at times when you are unable to breastfeed. This is especially important if you are going back to work while you are still breastfeeding or when you are not able to be present during feedings. Your lactation consultant can give you guidelines on how long it is safe to store breast milk. A breast pump is a machine that allows you to pump milk from your breast into a sterile bottle. The pumped breast milk can then be stored in a refrigerator or freezer. Some breast pumps are operated by hand, while others use electricity. Ask your lactation consultant which type will work best for you. Breast pumps can be purchased, but some hospitals and breastfeeding support groups lease breast pumps on a monthly basis. A lactation consultant can teach you how to   hand express breast milk, if you prefer not to use a pump. Caring for your breasts while you breastfeed Nipples can become dry, cracked, and sore while breastfeeding. The following recommendations can help keep your breasts moisturized and healthy:  Avoid using soap on your nipples.  Wear a supportive bra. Although not required, special nursing bras and tank tops are designed to allow access to your breasts for breastfeeding without taking off your entire bra or top. Avoid wearing underwire-style bras or extremely tight bras.  Air dry your nipples for 3-4minutes after each feeding.  Use only cotton bra pads to absorb leaked breast milk. Leaking of breast milk between feedings is normal.  Use lanolin on your nipples after breastfeeding. Lanolin helps to maintain your skin's normal moisture barrier. If you use pure lanolin, you do not need to wash it off  before feeding your baby again. Pure lanolin is not toxic to your baby. You may also hand express a few drops of breast milk and gently massage that milk into your nipples and allow the milk to air dry. In the first few weeks after giving birth, some women experience extremely full breasts (engorgement). Engorgement can make your breasts feel heavy, warm, and tender to the touch. Engorgement peaks within 3-5 days after you give birth. The following recommendations can help ease engorgement:  Completely empty your breasts while breastfeeding or pumping. You may want to start by applying warm, moist heat (in the shower or with warm water-soaked hand towels) just before feeding or pumping. This increases circulation and helps the milk flow. If your baby does not completely empty your breasts while breastfeeding, pump any extra milk after he or she is finished.  Wear a snug bra (nursing or regular) or tank top for 1-2 days to signal your body to slightly decrease milk production.  Apply ice packs to your breasts, unless this is too uncomfortable for you.  Make sure that your baby is latched on and positioned properly while breastfeeding. If engorgement persists after 48 hours of following these recommendations, contact your health care provider or a lactation consultant. Overall health care recommendations while breastfeeding  Eat healthy foods. Alternate between meals and snacks, eating 3 of each per day. Because what you eat affects your breast milk, some of the foods may make your baby more irritable than usual. Avoid eating these foods if you are sure that they are negatively affecting your baby.  Drink milk, fruit juice, and water to satisfy your thirst (about 10 glasses a day).  Rest often, relax, and continue to take your prenatal vitamins to prevent fatigue, stress, and anemia.  Continue breast self-awareness checks.  Avoid chewing and smoking tobacco. Chemicals from cigarettes that pass  into breast milk and exposure to secondhand smoke may harm your baby.  Avoid alcohol and drug use, including marijuana. Some medicines that may be harmful to your baby can pass through breast milk. It is important to ask your health care provider before taking any medicine, including all over-the-counter and prescription medicine as well as vitamin and herbal supplements. It is possible to become pregnant while breastfeeding. If birth control is desired, ask your health care provider about options that will be safe for your baby. Contact a health care provider if:  You feel like you want to stop breastfeeding or have become frustrated with breastfeeding.  You have painful breasts or nipples.  Your nipples are cracked or bleeding.  Your breasts are red, tender, or warm.  You have   a swollen area on either breast.  You have a fever or chills.  You have nausea or vomiting.  You have drainage other than breast milk from your nipples.  Your breasts do not become full before feedings by the fifth day after you give birth.  You feel sad and depressed.  Your baby is too sleepy to eat well.  Your baby is having trouble sleeping.  Your baby is wetting less than 3 diapers in a 24-hour period.  Your baby has less than 3 stools in a 24-hour period.  Your baby's skin or the white part of his or her eyes becomes yellow.  Your baby is not gaining weight by 5 days of age. Get help right away if:  Your baby is overly tired (lethargic) and does not want to wake up and feed.  Your baby develops an unexplained fever. This information is not intended to replace advice given to you by your health care provider. Make sure you discuss any questions you have with your health care provider. Document Released: 07/29/2005 Document Revised: 01/10/2016 Document Reviewed: 01/20/2013 Elsevier Interactive Patient Education  2017 [employer] Inc.  

## 2016-08-19 ENCOUNTER — Encounter (HOSPITAL_COMMUNITY): Payer: Self-pay

## 2016-08-19 ENCOUNTER — Inpatient Hospital Stay (HOSPITAL_COMMUNITY)
Admission: AD | Admit: 2016-08-19 | Discharge: 2016-08-19 | Disposition: A | Discharge status: Home / Self Care | Payer: Medicaid Other | Source: Ambulatory Visit | Attending: Family Medicine | Admitting: Family Medicine

## 2016-08-19 DIAGNOSIS — O98813 Other maternal infectious and parasitic diseases complicating pregnancy, third trimester: Secondary | ICD-10-CM

## 2016-08-19 DIAGNOSIS — Z3483 Encounter for supervision of other normal pregnancy, third trimester: Secondary | ICD-10-CM | POA: Diagnosis present

## 2016-08-19 DIAGNOSIS — N760 Acute vaginitis: Secondary | ICD-10-CM

## 2016-08-19 DIAGNOSIS — O26899 Other specified pregnancy related conditions, unspecified trimester: Secondary | ICD-10-CM

## 2016-08-19 DIAGNOSIS — Z283 Underimmunization status: Secondary | ICD-10-CM

## 2016-08-19 DIAGNOSIS — O9989 Other specified diseases and conditions complicating pregnancy, childbirth and the puerperium: Secondary | ICD-10-CM

## 2016-08-19 DIAGNOSIS — B9689 Other specified bacterial agents as the cause of diseases classified elsewhere: Secondary | ICD-10-CM

## 2016-08-19 DIAGNOSIS — Z6791 Unspecified blood type, Rh negative: Secondary | ICD-10-CM

## 2016-08-19 DIAGNOSIS — A749 Chlamydial infection, unspecified: Secondary | ICD-10-CM

## 2016-08-19 DIAGNOSIS — O09899 Supervision of other high risk pregnancies, unspecified trimester: Secondary | ICD-10-CM

## 2016-08-19 NOTE — Discharge Instructions (Signed)
Third Trimester of Pregnancy °The third trimester is from week 29 through week 40 (months 7 through 9). The third trimester is a time when the unborn baby (fetus) is growing rapidly. At the end of the ninth month, the fetus is about 20 inches in length and weighs 6-10 pounds. °Body changes during your third trimester °Your body goes through many changes during pregnancy. The changes vary from woman to woman. During the third trimester: °· Your weight will continue to increase. You can expect to gain 25-35 pounds (11-16 kg) by the end of the pregnancy. °· You may begin to get stretch marks on your hips, abdomen, and breasts. °· You may urinate more often because the fetus is moving lower into your pelvis and pressing on your bladder. °· You may develop or continue to have heartburn. This is caused by increased hormones that slow down muscles in the digestive tract. °· You may develop or continue to have constipation because increased hormones slow digestion and cause the muscles that push waste through your intestines to relax. °· You may develop hemorrhoids. These are swollen veins (varicose veins) in the rectum that can itch or be painful. °· You may develop swollen, bulging veins (varicose veins) in your legs. °· You may have increased body aches in the pelvis, back, or thighs. This is due to weight gain and increased hormones that are relaxing your joints. °· You may have changes in your hair. These can include thickening of your hair, rapid growth, and changes in texture. Some women also have hair loss during or after pregnancy, or hair that feels dry or thin. Your hair will most likely return to normal after your baby is born. °· Your breasts will continue to grow and they will continue to become tender. A yellow fluid (colostrum) may leak from your breasts. This is the first milk you are producing for your baby. °· Your belly button may stick out. °· You may notice more swelling in your hands, face, or  ankles. °· You may have increased tingling or numbness in your hands, arms, and legs. The skin on your belly may also feel numb. °· You may feel short of breath because of your expanding uterus. °· You may have more problems sleeping. This can be caused by the size of your belly, increased need to urinate, and an increase in your body's metabolism. °· You may notice the fetus "dropping," or moving lower in your abdomen. °· You may have increased vaginal discharge. °· Your cervix becomes thin and soft (effaced) near your due date. °What to expect at prenatal visits °You will have prenatal exams every 2 weeks until week 36. Then you will have weekly prenatal exams. During a routine prenatal visit: °· You will be weighed to make sure you and the fetus are growing normally. °· Your blood pressure will be taken. °· Your abdomen will be measured to track your baby's growth. °· The fetal heartbeat will be listened to. °· Any test results from the previous visit will be discussed. °· You may have a cervical check near your due date to see if you have effaced. °At around 36 weeks, your health care provider will check your cervix. At the same time, your health care provider will also perform a test on the secretions of the vaginal tissue. This test is to determine if a type of bacteria, Group B streptococcus, is present. Your health care provider will explain this further. °Your health care provider may ask you: °·   What your birth plan is. °· How you are feeling. °· If you are feeling the baby move. °· If you have had any abnormal symptoms, such as leaking fluid, bleeding, severe headaches, or abdominal cramping. °· If you are using any tobacco products, including cigarettes, chewing tobacco, and electronic cigarettes. °· If you have any questions. °Other tests or screenings that may be performed during your third trimester include: °· Blood tests that check for low iron levels (anemia). °· Fetal testing to check the health,  activity level, and growth of the fetus. Testing is done if you have certain medical conditions or if there are problems during the pregnancy. °· Nonstress test (NST). This test checks the health of your baby to make sure there are no signs of problems, such as the baby not getting enough oxygen. During this test, a belt is placed around your belly. The baby is made to move, and its heart rate is monitored during movement. °What is false labor? °False labor is a condition in which you feel small, irregular tightenings of the muscles in the womb (contractions) that eventually go away. These are called Braxton Hicks contractions. Contractions may last for hours, days, or even weeks before true labor sets in. If contractions come at regular intervals, become more frequent, increase in intensity, or become painful, you should see your health care provider. °What are the signs of labor? °· Abdominal cramps. °· Regular contractions that start at 10 minutes apart and become stronger and more frequent with time. °· Contractions that start on the top of the uterus and spread down to the lower abdomen and back. °· Increased pelvic pressure and dull back pain. °· A watery or bloody mucus discharge that comes from the vagina. °· Leaking of amniotic fluid. This is also known as your "water breaking." It could be a slow trickle or a gush. Let your doctor know if it has a color or strange odor. °If you have any of these signs, call your health care provider right away, even if it is before your due date. °Follow these instructions at home: °Eating and drinking °· Continue to eat regular, healthy meals. °· Do not eat: °¨ Raw meat or meat spreads. °¨ Unpasteurized milk or cheese. °¨ Unpasteurized juice. °¨ Store-made salad. °¨ Refrigerated smoked seafood. °¨ Hot dogs or deli meat, unless they are piping hot. °¨ More than 6 ounces of albacore tuna a week. °¨ Shark, swordfish, king mackerel, or tile fish. °¨ Store-made salads. °¨ Raw  sprouts, such as mung bean or alfalfa sprouts. °· Take prenatal vitamins as told by your health care provider. °· Take 1000 mg of calcium daily as told by your health care provider. °· If you develop constipation: °¨ Take over-the-counter or prescription medicines. °¨ Drink enough fluid to keep your urine clear or pale yellow. °¨ Eat foods that are high in fiber, such as fresh fruits and vegetables, whole grains, and beans. °¨ Limit foods that are high in fat and processed sugars, such as fried and sweet foods. °Activity °· Exercise only as directed by your health care provider. Healthy pregnant women should aim for 2 hours and 30 minutes of moderate exercise per week. If you experience any pain or discomfort while exercising, stop. °· Avoid heavy lifting. °· Do not exercise in extreme heat or humidity, or at high altitudes. °· Wear low-heel, comfortable shoes. °· Practice good posture. °· Do not travel far distances unless it is absolutely necessary and only with the approval   of your health care provider. °· Wear your seat belt at all times while in a car, on a bus, or on a plane. °· Take frequent breaks and rest with your legs elevated if you have leg cramps or low back pain. °· Do not use hot tubs, steam rooms, or saunas. °· You may continue to have sex unless your health care provider tells you otherwise. °Lifestyle °· Do not use any products that contain nicotine or tobacco, such as cigarettes and e-cigarettes. If you need help quitting, ask your health care provider. °· Do not drink alcohol. °· Do not use any medicinal herbs or unprescribed drugs. These chemicals affect the formation and growth of the baby. °· If you develop varicose veins: °¨ Wear support pantyhose or compression stockings as told by your healthcare provider. °¨ Elevate your feet for 15 minutes, 3-4 times a day. °· Wear a supportive maternity bra to help with breast tenderness. °General instructions °· Take over-the-counter and prescription  medicines only as told by your health care provider. There are medicines that are either safe or unsafe to take during pregnancy. °· Take warm sitz baths to soothe any pain or discomfort caused by hemorrhoids. Use hemorrhoid cream or witch hazel if your health care provider approves. °· Avoid cat litter boxes and soil used by cats. These carry germs that can cause birth defects in the baby. If you have a cat, ask someone to clean the litter box for you. °· To prepare for the arrival of your baby: °¨ Take prenatal classes to understand, practice, and ask questions about the labor and delivery. °¨ Make a trial run to the hospital. °¨ Visit the hospital and tour the maternity area. °¨ Arrange for maternity or paternity leave through employers. °¨ Arrange for family and friends to take care of pets while you are in the hospital. °¨ Purchase a rear-facing car seat and make sure you know how to install it in your car. °¨ Pack your hospital bag. °¨ Prepare the baby’s nursery. Make sure to remove all pillows and stuffed animals from the baby's crib to prevent suffocation. °· Visit your dentist if you have not gone during your pregnancy. Use a soft toothbrush to brush your teeth and be gentle when you floss. °· Keep all prenatal follow-up visits as told by your health care provider. This is important. °Contact a health care provider if: °· You are unsure if you are in labor or if your water has broken. °· You become dizzy. °· You have mild pelvic cramps, pelvic pressure, or nagging pain in your abdominal area. °· You have lower back pain. °· You have persistent nausea, vomiting, or diarrhea. °· You have an unusual or bad smelling vaginal discharge. °· You have pain when you urinate. °Get help right away if: °· You have a fever. °· You are leaking fluid from your vagina. °· You have spotting or bleeding from your vagina. °· You have severe abdominal pain or cramping. °· You have rapid weight loss or weight gain. °· You have  shortness of breath with chest pain. °· You notice sudden or extreme swelling of your face, hands, ankles, feet, or legs. °· Your baby makes fewer than 10 movements in 2 hours. °· You have severe headaches that do not go away with medicine. °· You have vision changes. °Summary °· The third trimester is from week 29 through week 40, months 7 through 9. The third trimester is a time when the unborn baby (fetus)   is growing rapidly. °· During the third trimester, your discomfort may increase as you and your baby continue to gain weight. You may have abdominal, leg, and back pain, sleeping problems, and an increased need to urinate. °· During the third trimester your breasts will keep growing and they will continue to become tender. A yellow fluid (colostrum) may leak from your breasts. This is the first milk you are producing for your baby. °· False labor is a condition in which you feel small, irregular tightenings of the muscles in the womb (contractions) that eventually go away. These are called Braxton Hicks contractions. Contractions may last for hours, days, or even weeks before true labor sets in. °· Signs of labor can include: abdominal cramps; regular contractions that start at 10 minutes apart and become stronger and more frequent with time; watery or bloody mucus discharge that comes from the vagina; increased pelvic pressure and dull back pain; and leaking of amniotic fluid. °This information is not intended to replace advice given to you by your health care provider. Make sure you discuss any questions you have with your health care provider. °Document Released: 07/23/2001 Document Revised: 01/04/2016 Document Reviewed: 09/29/2012 °Elsevier Interactive Patient Education © 2017 [employer] °Introduction °Patient Name: ________________________________________________ Patient Due Date: ____________________ °What is a fetal movement count? °A fetal movement count is the number of times that you feel your baby  move during a certain amount of time. This may also be called a fetal kick count. A fetal movement count is recommended for every pregnant woman. You may be asked to start counting fetal movements as early as week 28 of your pregnancy. °Pay attention to when your baby is most active. You may notice your baby's sleep and wake cycles. You may also notice things that make your baby move more. You should do a fetal movement count: °· When your baby is normally most active. °· At the same time each day. °A good time to count movements is while you are resting, after having something to eat and drink. °How do I count fetal movements? °1. Find a quiet, comfortable area. Sit, or lie down on your side. °2. Write down the date, the start time and stop time, and the number of movements that you felt between those two times. Take this information with you to your health care visits. °3. For 2 hours, count kicks, flutters, swishes, rolls, and jabs. You should feel at least 10 movements during 2 hours. °4. You may stop counting after you have felt 10 movements. °5. If you do not feel 10 movements in 2 hours, have something to eat and drink. Then, keep resting and counting for 1 hour. If you feel at least 4 movements during that hour, you may stop counting. °Contact a health care provider if: °· You feel fewer than 4 movements in 2 hours. °· Your baby is not moving like he or she usually does. °Date: ____________ Start time: ____________ Stop time: ____________ Movements: ____________ °Date: ____________ Start time: ____________ Stop time: ____________ Movements: ____________ °Date: ____________ Start time: ____________ Stop time: ____________ Movements: ____________ °Date: ____________ Start time: ____________ Stop time: ____________ Movements: ____________ °Date: ____________ Start time: ____________ Stop time: ____________ Movements: ____________ °Date: ____________ Start time: ____________ Stop time: ____________ Movements:  ____________ °Date: ____________ Start time: ____________ Stop time: ____________ Movements: ____________ °Date: ____________ Start time: ____________ Stop time: ____________ Movements: ____________ °Date: ____________ Start time: ____________ Stop time: ____________ Movements: ____________ °This information is not intended to replace   advice given to you by your health care provider. Make sure you discuss any questions you have with your health care provider. °Document Released: 08/28/2006 Document Revised: 03/27/2016 Document Reviewed: 09/07/2015 °Elsevier Interactive Patient Education © 2017 [employer] °Braxton Hicks Contractions °Contractions of the uterus can occur throughout pregnancy. Contractions are not always a sign that you are in labor.  °WHAT ARE BRAXTON HICKS CONTRACTIONS?  °Contractions that occur before labor are called Braxton Hicks contractions, or false labor. Toward the end of pregnancy (32-34 weeks), these contractions can develop more often and may become more forceful. This is not true labor because these contractions do not result in opening (dilatation) and thinning of the cervix. They are sometimes difficult to tell apart from true labor because these contractions can be forceful and people have different pain tolerances. You should not feel embarrassed if you go to the hospital with false labor. Sometimes, the only way to tell if you are in true labor is for your health care provider to look for changes in the cervix. °If there are no prenatal problems or other health problems associated with the pregnancy, it is completely safe to be sent home with false labor and await the onset of true labor. °HOW CAN YOU TELL THE DIFFERENCE BETWEEN TRUE AND FALSE LABOR? °False Labor  °· The contractions of false labor are usually shorter and not as hard as those of true labor.   °· The contractions are usually irregular.   °· The contractions are often felt in the front of the lower abdomen and in  the groin.   °· The contractions may go away when you walk around or change positions while lying down.   °· The contractions get weaker and are shorter lasting as time goes on.   °· The contractions do not usually become progressively stronger, regular, and closer together as with true labor.   °True Labor  °· Contractions in true labor last 30-70 seconds, become very regular, usually become more intense, and increase in frequency.   °· The contractions do not go away with walking.   °· The discomfort is usually felt in the top of the uterus and spreads to the lower abdomen and low back.   °· True labor can be determined by your health care provider with an exam. This will show that the cervix is dilating and getting thinner.   °WHAT TO REMEMBER °· Keep up with your usual exercises and follow other instructions given by your health care provider.   °· Take medicines as directed by your health care provider.   °· Keep your regular prenatal appointments.   °· Eat and drink lightly if you think you are going into labor.   °· If Braxton Hicks contractions are making you uncomfortable:   °¨ Change your position from lying down or resting to walking, or from walking to resting.   °¨ Sit and rest in a tub of warm water.   °¨ Drink 2-3 glasses of water. Dehydration may cause these contractions.   °¨ Do slow and deep breathing several times an hour.   °WHEN SHOULD I SEEK IMMEDIATE MEDICAL CARE? °Seek immediate medical care if: °· Your contractions become stronger, more regular, and closer together.   °· You have fluid leaking or gushing from your vagina.   °· You have a fever.   °· You pass blood-tinged mucus.   °· You have vaginal bleeding.   °· You have continuous abdominal pain.   °· You have low back pain that you never had before.   °· You feel your baby's head pushing down and causing pelvic pressure.   °· Your   baby is not moving as much as it used to.   °This information is not intended to replace advice given to you  by your health care provider. Make sure you discuss any questions you have with your health care provider. °Document Released: 07/29/2005 Document Revised: 11/20/2015 Document Reviewed: 05/10/2013 °Elsevier Interactive Patient Education © 2017 [employer] ° °

## 2016-08-19 NOTE — Progress Notes (Signed)
Notified of pt unchanged cervix. Ok to discharge home with labor precautions 

## 2016-08-19 NOTE — Progress Notes (Signed)
Notified of pt arrival in MAU and exam. Will review tracing

## 2016-08-19 NOTE — MAU Note (Signed)
Pt presents complaining of contractions she thinks are every 5-6 minutes apart. Denies leaking or bleeding. Reports good fetal movement. 1cm last time cervix was checked.

## 2016-08-20 ENCOUNTER — Inpatient Hospital Stay (HOSPITAL_COMMUNITY): Payer: Medicaid Other | Admitting: Anesthesiology

## 2016-08-20 ENCOUNTER — Encounter (HOSPITAL_COMMUNITY): Payer: Self-pay | Admitting: Anesthesiology

## 2016-08-20 ENCOUNTER — Inpatient Hospital Stay (HOSPITAL_COMMUNITY)
Admission: AD | Admit: 2016-08-20 | Discharge: 2016-08-22 | DRG: 775 | Disposition: A | Discharge status: Home / Self Care | Payer: Medicaid Other | Source: Ambulatory Visit | Attending: Family Medicine | Admitting: Family Medicine

## 2016-08-20 DIAGNOSIS — O26893 Other specified pregnancy related conditions, third trimester: Principal | ICD-10-CM | POA: Diagnosis present

## 2016-08-20 DIAGNOSIS — Z833 Family history of diabetes mellitus: Secondary | ICD-10-CM | POA: Diagnosis not present

## 2016-08-20 DIAGNOSIS — O479 False labor, unspecified: Secondary | ICD-10-CM | POA: Diagnosis present

## 2016-08-20 DIAGNOSIS — Z3483 Encounter for supervision of other normal pregnancy, third trimester: Secondary | ICD-10-CM

## 2016-08-20 DIAGNOSIS — O99334 Smoking (tobacco) complicating childbirth: Secondary | ICD-10-CM | POA: Diagnosis present

## 2016-08-20 DIAGNOSIS — F17210 Nicotine dependence, cigarettes, uncomplicated: Secondary | ICD-10-CM | POA: Diagnosis present

## 2016-08-20 DIAGNOSIS — Z3A40 40 weeks gestation of pregnancy: Secondary | ICD-10-CM

## 2016-08-20 DIAGNOSIS — Z6791 Unspecified blood type, Rh negative: Secondary | ICD-10-CM | POA: Diagnosis not present

## 2016-08-20 DIAGNOSIS — Z3493 Encounter for supervision of normal pregnancy, unspecified, third trimester: Secondary | ICD-10-CM | POA: Diagnosis present

## 2016-08-20 DIAGNOSIS — O98813 Other maternal infectious and parasitic diseases complicating pregnancy, third trimester: Secondary | ICD-10-CM

## 2016-08-20 DIAGNOSIS — B9689 Other specified bacterial agents as the cause of diseases classified elsewhere: Secondary | ICD-10-CM

## 2016-08-20 DIAGNOSIS — Z3A39 39 weeks gestation of pregnancy: Secondary | ICD-10-CM

## 2016-08-20 DIAGNOSIS — O9989 Other specified diseases and conditions complicating pregnancy, childbirth and the puerperium: Secondary | ICD-10-CM

## 2016-08-20 DIAGNOSIS — N760 Acute vaginitis: Secondary | ICD-10-CM

## 2016-08-20 DIAGNOSIS — O26899 Other specified pregnancy related conditions, unspecified trimester: Secondary | ICD-10-CM

## 2016-08-20 DIAGNOSIS — O09899 Supervision of other high risk pregnancies, unspecified trimester: Secondary | ICD-10-CM

## 2016-08-20 DIAGNOSIS — A749 Chlamydial infection, unspecified: Secondary | ICD-10-CM

## 2016-08-20 DIAGNOSIS — Z283 Underimmunization status: Secondary | ICD-10-CM

## 2016-08-20 LAB — CBC
HEMATOCRIT: 33.2 % — AB (ref 36.0–46.0)
Hemoglobin: 11.6 g/dL — ABNORMAL LOW (ref 12.0–15.0)
MCH: 29.7 pg (ref 26.0–34.0)
MCHC: 34.9 g/dL (ref 30.0–36.0)
MCV: 84.9 fL (ref 78.0–100.0)
Platelets: 202 10*3/uL (ref 150–400)
RBC: 3.91 MIL/uL (ref 3.87–5.11)
RDW: 13.6 % (ref 11.5–15.5)
WBC: 19.8 10*3/uL — AB (ref 4.0–10.5)

## 2016-08-20 LAB — RPR: RPR Ser Ql: NONREACTIVE

## 2016-08-20 MED ORDER — WITCH HAZEL-GLYCERIN EX PADS: 1.0000 "application " | MEDICATED_PAD | CUTANEOUS | Status: DC | PRN

## 2016-08-20 MED ORDER — ZOLPIDEM TARTRATE 5 MG PO TABS: 5.0000 mg | ORAL_TABLET | Freq: Every evening | ORAL | Status: DC | PRN

## 2016-08-20 MED ORDER — OXYCODONE-ACETAMINOPHEN 5-325 MG PO TABS: 2.0000 | ORAL_TABLET | ORAL | Status: DC | PRN

## 2016-08-20 MED ORDER — PRENATAL MULTIVITAMIN CH
1.0000 | ORAL_TABLET | Freq: Every day | ORAL | Status: DC
  Administered 2016-08-21 – 2016-08-22 (×2): 1 via ORAL
  Filled 2016-08-20 (×2): qty 1

## 2016-08-20 MED ORDER — EPHEDRINE 5 MG/ML INJ
10.0000 mg | INTRAVENOUS | Status: DC | PRN
  Filled 2016-08-20: qty 4

## 2016-08-20 MED ORDER — DIPHENHYDRAMINE HCL 25 MG PO CAPS: 25.0000 mg | ORAL_CAPSULE | Freq: Four times a day (QID) | ORAL | Status: DC | PRN

## 2016-08-20 MED ORDER — SIMETHICONE 80 MG PO CHEW: 80.0000 mg | CHEWABLE_TABLET | ORAL | Status: DC | PRN

## 2016-08-20 MED ORDER — BENZOCAINE-MENTHOL 20-0.5 % EX AERO: 1.0000 "application " | INHALATION_SPRAY | CUTANEOUS | Status: DC | PRN

## 2016-08-20 MED ORDER — LIDOCAINE HCL (PF) 1 % IJ SOLN
30.0000 mL | INTRAMUSCULAR | Status: DC | PRN
  Filled 2016-08-20: qty 30

## 2016-08-20 MED ORDER — MEASLES, MUMPS & RUBELLA VAC ~~LOC~~ INJ
0.5000 mL | INJECTION | Freq: Once | SUBCUTANEOUS | Status: AC
Start: 2016-08-20 — End: 2016-08-21
  Administered 2016-08-21: 0.5 mL via SUBCUTANEOUS
  Filled 2016-08-20: qty 0.5

## 2016-08-20 MED ORDER — OXYCODONE-ACETAMINOPHEN 5-325 MG PO TABS
1.0000 | ORAL_TABLET | ORAL | Status: DC | PRN
Start: 2016-08-20 — End: 2016-08-20

## 2016-08-20 MED ORDER — OXYTOCIN 40 UNITS IN LACTATED RINGERS INFUSION - SIMPLE MED
2.5000 [IU]/h | INTRAVENOUS | Status: DC
  Filled 2016-08-20: qty 1000

## 2016-08-20 MED ORDER — IBUPROFEN 600 MG PO TABS
600.0000 mg | ORAL_TABLET | Freq: Four times a day (QID) | ORAL | Status: DC
  Administered 2016-08-20 – 2016-08-22 (×7): 600 mg via ORAL
  Filled 2016-08-20 (×8): qty 1

## 2016-08-20 MED ORDER — OXYTOCIN BOLUS FROM INFUSION
500.0000 mL | Freq: Once | INTRAVENOUS | Status: AC
  Administered 2016-08-20: 500 mL via INTRAVENOUS

## 2016-08-20 MED ORDER — FENTANYL 2.5 MCG/ML BUPIVACAINE 1/10 % EPIDURAL INFUSION (WH - ANES)
INTRAMUSCULAR | Status: DC | PRN
  Administered 2016-08-20: 14 mL/h via EPIDURAL

## 2016-08-20 MED ORDER — ACETAMINOPHEN 325 MG PO TABS
650.0000 mg | ORAL_TABLET | ORAL | Status: DC | PRN
  Administered 2016-08-21 – 2016-08-22 (×4): 650 mg via ORAL
  Filled 2016-08-20 (×4): qty 2

## 2016-08-20 MED ORDER — ONDANSETRON HCL 4 MG/2ML IJ SOLN: 4.0000 mg | Freq: Four times a day (QID) | INTRAMUSCULAR | Status: DC | PRN

## 2016-08-20 MED ORDER — TETANUS-DIPHTH-ACELL PERTUSSIS 5-2.5-18.5 LF-MCG/0.5 IM SUSP: 0.5000 mL | Freq: Once | INTRAMUSCULAR | Status: DC

## 2016-08-20 MED ORDER — LIDOCAINE HCL (PF) 1 % IJ SOLN
INTRAMUSCULAR | Status: DC | PRN
  Administered 2016-08-20: 6 mL
  Administered 2016-08-20: 6 mL via EPIDURAL

## 2016-08-20 MED ORDER — ONDANSETRON HCL 4 MG PO TABS: 4.0000 mg | ORAL_TABLET | ORAL | Status: DC | PRN

## 2016-08-20 MED ORDER — FENTANYL CITRATE (PF) 100 MCG/2ML IJ SOLN: 50.0000 ug | INTRAMUSCULAR | Status: DC | PRN

## 2016-08-20 MED ORDER — DIPHENHYDRAMINE HCL 50 MG/ML IJ SOLN: 12.5000 mg | INTRAMUSCULAR | Status: DC | PRN

## 2016-08-20 MED ORDER — ONDANSETRON HCL 4 MG/2ML IJ SOLN: 4.0000 mg | INTRAMUSCULAR | Status: DC | PRN

## 2016-08-20 MED ORDER — SENNOSIDES-DOCUSATE SODIUM 8.6-50 MG PO TABS
2.0000 | ORAL_TABLET | ORAL | Status: DC
  Administered 2016-08-20: 2 via ORAL
  Filled 2016-08-20 (×2): qty 2

## 2016-08-20 MED ORDER — PHENYLEPHRINE 40 MCG/ML (10ML) SYRINGE FOR IV PUSH (FOR BLOOD PRESSURE SUPPORT)
80.0000 ug | PREFILLED_SYRINGE | INTRAVENOUS | Status: DC | PRN
  Filled 2016-08-20: qty 5
  Filled 2016-08-20: qty 10

## 2016-08-20 MED ORDER — SOD CITRATE-CITRIC ACID 500-334 MG/5ML PO SOLN: 30.0000 mL | ORAL | Status: DC | PRN

## 2016-08-20 MED ORDER — FENTANYL 2.5 MCG/ML BUPIVACAINE 1/10 % EPIDURAL INFUSION (WH - ANES)
14.0000 mL/h | INTRAMUSCULAR | Status: DC | PRN
  Administered 2016-08-20: 14 mL/h via EPIDURAL
  Filled 2016-08-20: qty 100

## 2016-08-20 MED ORDER — LACTATED RINGERS IV SOLN: 500.0000 mL | INTRAVENOUS | Status: DC | PRN

## 2016-08-20 MED ORDER — FLEET ENEMA 7-19 GM/118ML RE ENEM: 1.0000 | ENEMA | RECTAL | Status: DC | PRN

## 2016-08-20 MED ORDER — LACTATED RINGERS IV SOLN: 500.0000 mL | Freq: Once | INTRAVENOUS | Status: DC

## 2016-08-20 MED ORDER — PHENYLEPHRINE 40 MCG/ML (10ML) SYRINGE FOR IV PUSH (FOR BLOOD PRESSURE SUPPORT)
80.0000 ug | PREFILLED_SYRINGE | INTRAVENOUS | Status: DC | PRN
  Filled 2016-08-20: qty 5

## 2016-08-20 MED ORDER — COCONUT OIL OIL: 1.0000 "application " | TOPICAL_OIL | Status: DC | PRN

## 2016-08-20 MED ORDER — DIBUCAINE 1 % RE OINT: 1.0000 "application " | TOPICAL_OINTMENT | RECTAL | Status: DC | PRN

## 2016-08-20 MED ORDER — LACTATED RINGERS IV SOLN
INTRAVENOUS | Status: DC
  Administered 2016-08-20: 125 mL via INTRAVENOUS

## 2016-08-20 MED ORDER — ACETAMINOPHEN 325 MG PO TABS: 650.0000 mg | ORAL_TABLET | ORAL | Status: DC | PRN

## 2016-08-20 NOTE — Anesthesia Preprocedure Evaluation (Signed)
Anesthesia Evaluation  Patient identified by MRN, date of birth, ID band Patient awake    Reviewed: Allergy & Precautions, H&P , Patient's Chart, lab work & pertinent test results  Airway Mallampati: I  TM Distance: >3 FB Neck ROM: full    Dental no notable dental hx.    Pulmonary Current Smoker,    Pulmonary exam normal        Cardiovascular negative cardio ROS Normal cardiovascular exam     Neuro/Psych negative neurological ROS     GI/Hepatic negative GI ROS, Neg liver ROS,   Endo/Other  negative endocrine ROS  Renal/GU negative Renal ROS     Musculoskeletal   Abdominal Normal abdominal exam  (+)   Peds  Hematology negative hematology ROS (+)   Anesthesia Other Findings   Reproductive/Obstetrics (+) Pregnancy                             Anesthesia Physical Anesthesia Plan  ASA: II  Anesthesia Plan: Epidural   Post-op Pain Management:    Induction:   Airway Management Planned:   Additional Equipment:   Intra-op Plan:   Post-operative Plan:   Informed Consent: I have reviewed the patients History and Physical, chart, labs and discussed the procedure including the risks, benefits and alternatives for the proposed anesthesia with the patient or authorized representative who has indicated his/her understanding and acceptance.     Plan Discussed with:   Anesthesia Plan Comments:         Anesthesia Quick Evaluation  

## 2016-08-20 NOTE — Anesthesia Procedure Notes (Signed)
Epidural Patient location during procedure: OB Start time: 08/20/2016 8:50 AM End time: 08/20/2016 8:58 AM  Staffing Anesthesiologist: Leilani AbleHATCHETT, Mahagony Grieb Performed: anesthesiologist   Preanesthetic Checklist Completed: patient identified, surgical consent, pre-op evaluation, timeout performed, IV checked, risks and benefits discussed and monitors and equipment checked  Epidural Patient position: sitting Prep: site prepped and draped and DuraPrep Patient monitoring: continuous pulse ox and blood pressure Approach: midline Location: L3-L4 Injection technique: LOR air  Needle:  Needle type: Tuohy  Needle gauge: 17 G Needle length: 9 cm and 9 Needle insertion depth: 5 cm cm Catheter type: closed end flexible Catheter size: 19 Gauge Catheter at skin depth: 10 cm Test dose: negative and Other  Assessment Sensory level: T10 Events: blood not aspirated, injection not painful, no injection resistance, negative IV test and no paresthesia  Additional Notes Reason for block:procedure for pain

## 2016-08-20 NOTE — Anesthesia Pain Management Evaluation Note (Signed)
  CRNA Pain Management Visit Note  Patient: Marcia Jackson, 26 y.o., female  "Hello I am a member of the anesthesia team at [facility]. We have an anesthesia team available at all times to provide care throughout the hospital, including epidural management and anesthesia for C-section. I don't know your plan for the delivery whether it a natural birth, water birth, IV sedation, nitrous supplementation, doula or epidural, but we want to meet your pain goals."   1.Was your pain managed to your expectations on prior hospitalizations?   Yes   2.What is your expectation for pain management during this hospitalization?     Epidural  3.How can we help you reach that goal? epidural  Record the patient's initial score and the patient's pain goal.   Pain: 10  Pain Goal: 10 [facility] wants you to be able to say your pain was always managed very well.  Vallerie Hentz 08/20/2016

## 2016-08-20 NOTE — Progress Notes (Signed)
LABOR PROGRESS NOTE  Marcia Jackson is a 26 y.o. G2P1001 at 4591w2d  admitted for labor  Subjective: No pain s/p epidural  Objective: BP (!) 97/56   Pulse 77   Temp 97.9 F (36.6 C) (Oral)   Resp 20   Ht 5\' 3"  (1.6 m)   Wt 118 lb (53.5 kg)   LMP 01/11/2016   BMI 20.90 kg/m  or  Vitals:   08/20/16 1231 08/20/16 1301 08/20/16 1329 08/20/16 1401  BP: (!) 93/53 111/70 (!) 103/55 (!) 97/56  Pulse: 77 84 84 77  Resp:      Temp:      TempSrc:      Weight:      Height:        150/mod/-a/-d Dilation: Lip/rim Effacement (%): 100 Cervical Position: Middle Station: -1 Presentation: Vertex Exam by:: Dr Ashok PallWouk   Labs: Lab Results  Component Value Date   WBC 19.8 (H) 08/20/2016   HGB 11.6 (L) 08/20/2016   HCT 33.2 (L) 08/20/2016   MCV 84.9 08/20/2016   PLT 202 08/20/2016    Patient Active Problem List   Diagnosis Date Noted  . Uterine contractions during pregnancy 08/20/2016  . Rh negative, antepartum 05/17/2016  . Rubella non-immune status, antepartum 05/17/2016  . Family history of congenital heart defect 05/07/2016  . Chlamydia infection affecting pregnancy 04/09/2016  . BV (bacterial vaginosis) 04/09/2016  . Supervision of normal intrauterine pregnancy in multigravida 04/09/2016    Assessment / Plan: 26 y.o. G2P1001 at 6291w2d here for labor  Labor: just now s/p arom clear; expectant Fetal Wellbeing:  Cat 1 Pain Control:  S/p epidural Anticipated MOD:  vag  Marcia BilisNoah B Caniyah Murley, MD 08/20/2016, 2:12 PM

## 2016-08-20 NOTE — H&P (Signed)
LABOR ADMISSION HISTORY AND PHYSICAL  Marcia Jackson is a 26 y.o. female G2P1001 with IUP at [redacted]w[redacted]d by U/S presenting for SOL. She notes that she's had contractions since 0400. She reports +FM, + contractions, No LOF, no VB, no blurry vision, headaches or peripheral edema, and RUQ pain.  She plans on bottle feeding. She request Depo for birth control.  Dating: By 20 wk U/S --->  Estimated Date of Delivery: 08/18/16  Sono:  @[redacted]w[redacted]d , CWD, normal anatomy, breech presentation, 510g, 50% EFW   Prenatal History/Complications: HX of child with CHD, fetal echo in this pregnancy normal Rh negative Rubella Non immune Late Prenatal care- 21 weeks  Past Medical History: Past Medical History:  Diagnosis Date  . Depression     Past Surgical History: Past Surgical History:  Procedure Laterality Date  . NO PAST SURGERIES      Obstetrical History: OB History    Gravida Para Term Preterm AB Living   2 1 1     1    SAB TAB Ectopic Multiple Live Births           1      Social History: Social History   Social History  . Marital status: Single    Spouse name: N/A  . Number of children: N/A  . Years of education: N/A   Social History Main Topics  . Smoking status: Current Every Day Smoker    Packs/day: 0.25    Types: Cigarettes  . Smokeless tobacco: Never Used  . Alcohol use Yes     Comment: occasionally  . Drug use:     Types: Marijuana     Comment: quit when found out pregnant   . Sexual activity: Yes   Other Topics Concern  . Not on file   Social History Narrative  . No narrative on file    Family History: Family History  Problem Relation Age of Onset  . Diabetes Maternal Aunt   . Cancer Maternal Grandmother   . Diabetes Maternal Grandmother     Allergies: No Known Allergies  Prescriptions Prior to Admission  Medication Sig Dispense Refill Last Dose  . acetaminophen (TYLENOL) 500 MG tablet Take 1,000 mg by mouth every 6 (six) hours as needed for mild pain,  moderate pain or headache.    08/18/2016 at Unknown time  . Prenatal Vit-Fe Fumarate-FA (PRENATAL MULTIVITAMIN) TABS tablet Take 1 tablet by mouth daily.    08/18/2016 at Unknown time     Review of Systems   All systems reviewed and negative except as stated in HPI  BP 112/67   Pulse 85   Temp 98.2 F (36.8 C)   Resp 18   Wt 53.5 kg (118 lb)   LMP 01/11/2016   BMI 20.90 kg/m  General appearance: alert, cooperative and no distress Lungs: clear to auscultation bilaterally Heart: regular rate and rhythm Abdomen: soft, non-tender; bowel sounds normal Pelvic: see below Extremities: Homans sign is negative, no sign of DVT, edema DTR's normal Presentation: cephalic Fetal monitoringBaseline: 145 bpm, Variability: Good {> 6 bpm), Accelerations: Reactive and Decelerations: Absent Uterine activityFrequency: Every 2-3 minutes Dilation: 5 Effacement (%): 80 Station: -2 Exam by:: ginger morris rn   Prenatal labs: ABO, Rh: O/NEG/-- (09/26 1327) Antibody: NEG (09/26 1327) Rubella: <0.90 (non immune) RPR: NON REAC (10/18 1412)  HBsAg: NEGATIVE (09/26 1327)  HIV: NONREACTIVE (10/18 1412)  GBS:   negative 1 hr Glucola normal Genetic screening: Too late Anatomy US: normal female  Prenatal Transfer Tool  Maternal Diabetes: No Genetic Screening: Declined Maternal Ultrasounds/Referrals: Normal Fetal Ultrasounds or other Referrals:  Fetal echo: normal Maternal Substance Abuse:  No Significant Maternal Medications:  None Significant Maternal Lab Results: Lab values include: Group B Strep negative  No results found for this or any previous visit (from the past 24 hour(s)).  Patient Active Problem List   Diagnosis Date Noted  . Uterine contractions during pregnancy 08/20/2016  . Rh negative, antepartum 05/17/2016  . Rubella non-immune status, antepartum 05/17/2016  . Family history of congenital heart defect 05/07/2016  . Chlamydia infection affecting pregnancy 04/09/2016  . BV  (bacterial vaginosis) 04/09/2016  . Supervision of normal intrauterine pregnancy in multigravida 04/09/2016    Assessment: Marcia Jackson is a 26 y.o. G2P1001 at 4581w2d here for SOL.  #Labor: Progressing normally, will avoid augmentation at this time #Pain: Requesting epidural #FWB: Cat 1 #ID: GBS neg #MOF: Bottle #MOC: Depo #Circ: outpatient  Anders Simmondshristina Gambino, MD [facility], PGY-2

## 2016-08-21 MED ORDER — RHO D IMMUNE GLOBULIN 1500 UNIT/2ML IJ SOSY
300.0000 ug | PREFILLED_SYRINGE | Freq: Once | INTRAMUSCULAR | Status: AC
  Administered 2016-08-21: 300 ug via INTRAMUSCULAR
  Filled 2016-08-21: qty 2

## 2016-08-21 NOTE — Discharge Instructions (Signed)

## 2016-08-21 NOTE — Progress Notes (Signed)
UR chart review completed.  

## 2016-08-21 NOTE — Discharge Summary (Signed)
OB Discharge Summary     Patient Name: Marcia Jackson DOB: 1991-01-19 MRN: [id number]  Date of admission: 08/20/2016 Delivering MD: Laurey Arrow BEDFORD   Date of discharge: 08/22/2016  Admitting diagnosis: LABOR Intrauterine pregnancy: [redacted]w[redacted]d    Secondary diagnosis:  Active Problems:   Uterine contractions during pregnancy  Additional problems: Rubella nonimmune     Discharge diagnosis: Term Pregnancy Delivered                                                                                                Post partum procedures:none  Augmentation: AROM  Complications: None  Hospital course:  Onset of Labor With Vaginal Delivery     26y.o. yo [id number] 469w2das admitted in Latent Labor on 08/20/2016. Patient had an uncomplicated labor course as follows:  Membrane Rupture Time/Date: 2:04 PM ,08/20/2016   Intrapartum Procedures: Episiotomy: None [1]                                         Lacerations:  None [1]  Patient had a delivery of a Viable infant. 08/20/2016  Information for the patient's newborn:  YaTarin, Johndrow0[[id number]]Delivery Method: Vag-Spont    Pateint had an uncomplicated postpartum course.  She is ambulating, tolerating a regular diet, passing flatus, and urinating well. Patient is discharged home in stable condition on 08/22/16. Rec'd MMR prior to discharge.    Physical exam  Vitals:   08/20/16 1908 08/21/16 0636 08/21/16 1934 08/22/16 0552  BP: (!) 100/56 (!) 97/58 107/76 106/63  Pulse: 83 83 61 66  Resp: _0 Temp: 98 F (36.7 C) 97.9 F (36.6 C) 97.5 F (36.4 C) 98 F (36.7 C)  TempSrc: Oral Oral Oral Oral  SpO2: 98% 98% 100%   Weight:      Height:       General: alert and cooperative Lochia: appropriate Uterine Fundus: soft Incision: N/A DVT Evaluation: No evidence of DVT seen on physical exam. Labs: Lab Results  Component Value Date   WBC 19.8 (H) 08/20/2016   HGB 11.6 (L) 08/20/2016   HCT 33.2 (L) 08/20/2016   MCV 84.9  08/20/2016   PLT 202 08/20/2016   CMP Latest Ref Rng & Units 03/31/2016  Glucose 65 - 99 mg/dL 100(H)  BUN 6 - 20 mg/dL <5(L)  Creatinine 0.44 - 1.00 mg/dL 0.45  Sodium 135 - 145 mmol/L 136  Potassium 3.5 - 5.1 mmol/L 3.4(L)  Chloride 101 - 111 mmol/L 109  CO2 22 - 32 mmol/L 21(L)  Calcium 8.9 - 10.3 mg/dL 8.3(L)  Total Protein 6.0 - 8.3 g/dL -  Total Bilirubin 0.3 - 1.2 mg/dL -  Alkaline Phos 39 - 117 U/L -  AST 0 - 37 U/L -  ALT 0 - 35 U/L -    Discharge instruction: per After Visit Summary and "Baby and Me Booklet".  After visit meds:  Allergies as of 08/22/2016   No Known Allergies  Medication List    STOP taking these medications   acetaminophen 500 MG tablet Commonly known as:  TYLENOL     TAKE these medications   docusate sodium 100 MG capsule Commonly known as:  COLACE Take 1 capsule (100 mg total) by mouth 2 (two) times daily.   ibuprofen 600 MG tablet Commonly known as:  ADVIL,MOTRIN Take 1 tablet (600 mg total) by mouth every 6 (six) hours as needed for mild pain or moderate pain.   prenatal multivitamin Tabs tablet Take 1 tablet by mouth daily.       Diet: routine diet  Activity: Advance as tolerated. Pelvic rest for 6 weeks.   Outpatient follow up:6 weeks Follow up Appt: Future Appointments Date Time Provider [place]  09/26/2016 10:20 AM Starr Lake, CNM [facility]   Follow up Visit:No Follow-up on file.  Postpartum contraception: Depo Provera  Newborn Data: Live born female  Birth Weight: 6 lb 11.2 oz (3040 g) APGAR: 9, 9  Baby Feeding: Breast Disposition:home with mother   08/22/2016 Eloise Levels, MD   CNM attestation I have seen and examined this patient and agree with above documentation in the resident's note.   Marcia Jackson is a 26 y.o. [id number] s/p SVD.   Pain is well controlled.  Plan for birth control is Depo-Provera.  Method of Feeding: breast  PE:  BP 106/63 (BP Location: Right Arm)    Pulse 66   Temp 98 F (36.7 C) (Oral)   Resp 18   Ht _0  (1.6 m)   Wt 53.5 kg (118 lb)   LMP 01/11/2016   SpO2 100%   Breastfeeding? Unknown   BMI 20.90 kg/m  Fundus firm   Recent Labs  08/20/16 0755  HGB 11.6*  HCT 33.2*     Plan: discharge today - postpartum care discussed - f/u clinic in 6 weeks for postpartum visit   Serita Grammes, CNM 9:43 AM  08/22/2016

## 2016-08-21 NOTE — Anesthesia Postprocedure Evaluation (Signed)
Anesthesia Post Note  Patient: Marcia LanJessica L Yates  Procedure(s) Performed: * No procedures listed *  Patient location during evaluation: Mother Baby Anesthesia Type: Epidural Level of consciousness: awake Pain management: pain level controlled Vital Signs Assessment: post-procedure vital signs reviewed and stable Respiratory status: spontaneous breathing Cardiovascular status: stable Postop Assessment: no headache, no backache, patient able to bend at knees and no signs of nausea or vomiting Anesthetic complications: no        Last Vitals:  Vitals:   08/20/16 1908 08/21/16 0636  BP: (!) 100/56 (!) 97/58  Pulse: 83 83  Resp: 18 18  Temp: 36.7 C 36.6 C    Last Pain:  Vitals:   08/21/16 0650  TempSrc:   PainSc: 2    Pain Goal:                 Tashira Torre JR,JOHN Tahsin Benyo

## 2016-08-21 NOTE — Progress Notes (Signed)
POSTPARTUM PROGRESS NOTE  Post Partum Day 1 Subjective:  Marcia Jackson is a 26 y.o. I6N6295G2P2002 3229w2d s/p SVD.  No acute events overnight.  Pt denies problems with ambulating, voiding or po intake.  She denies nausea or vomiting.  Pain is well controlled.  She has had flatus. She has not had bowel movement.  Lochia Minimal.   Objective: Blood pressure (!) 97/58, pulse 83, temperature 97.9 F (36.6 C), temperature source Oral, resp. rate 18, height 5\' 3"  (1.6 m), weight 53.5 kg (118 lb), last menstrual period 01/11/2016, SpO2 98 %, unknown if currently breastfeeding.  Physical Exam:  General: alert, cooperative and no distress Lochia:normal flow Chest: CTAB Heart: RRR no m/r/g Abdomen: +BS, soft, nontender,  Uterine Fundus: firm DVT Evaluation: No calf swelling or tenderness Extremities: no edema   Recent Labs  08/20/16 0755  HGB 11.6*  HCT 33.2*    Assessment/Plan:  ASSESSMENT: Marcia Jackson is a 26 y.o. [id number] 6829w2d s/p SVD  Discharge home-- will check in afternoon   LOS: 1 day   Renne Muscaaniel L Warden, MD PGY-1 [facility]  08/21/2016, 7:26 AM   OB FELLOW POSTPARTUM PROGRESS NOTE ATTESTATION  I have seen and examined this patient and agree with above documentation in the resident's note.   Marcia PennaNicholas Iaan Oregel, MD 10:53 AM

## 2016-08-21 NOTE — Addendum Note (Signed)
Addendum  created 08/21/16 1121 by Earmon PhoenixValerie P Kamsiyochukwu Buist, CRNA   Sign clinical note

## 2016-08-21 NOTE — Anesthesia Postprocedure Evaluation (Signed)
Anesthesia Post Note  Patient: Marcia Jackson  Procedure(s) Performed: * No procedures listed *  Patient location during evaluation: Mother Baby Anesthesia Type: Epidural Level of consciousness: awake Pain management: pain level controlled Vital Signs Assessment: post-procedure vital signs reviewed and stable Respiratory status: spontaneous breathing Cardiovascular status: stable Postop Assessment: no headache, no backache, epidural receding and patient able to bend at knees Anesthetic complications: no        Last Vitals:  Vitals:   08/20/16 1908 08/21/16 0636  BP: (!) 100/56 (!) 97/58  Pulse: 83 83  Resp: 18 18  Temp: 36.7 C 36.6 C    Last Pain:  Vitals:   08/21/16 0650  TempSrc:   PainSc: 2    Pain Goal:                 Edison PaceWILKERSON,Shyloh Krinke

## 2016-08-21 NOTE — Clinical Social Work Maternal (Signed)
  CLINICAL SOCIAL WORK MATERNAL/CHILD NOTE  Patient Details  Name: Marcia Jackson MRN: [id number] Date of Birth: 06/25/1991  Date:  March 19, 2017  Clinical Social Worker Initiating Note:  Laurey Arrow Date/ Time Initiated:  August 06, 2017/1141     Child's Name:  Marcia Jackson   Legal Guardian:  Mother   Need for Interpreter:  None   Date of Referral:  09/14/2016     Reason for Referral:  Behavioral Health Issues, including SI    Referral Source:  Central Nursery   Address:  [place]. [place] [address]  Phone number:  [phone]   Household Members:  Self, Minor Children, Significant Other   Natural Supports (not living in the home):  Parent, Immediate Family, Extended Family (MOB's and FOB's immediate and extended family. )   Professional Supports: None   Employment: Full-time   Type of Work: Programme researcher, broadcasting/film/video at [employer]   Education:  9 to 11 years   Museum/gallery curator Resources:  [employer]   Other Resources:  Physicist, medical , [place] Considerations Which May Impact Care:  Per [employer] Sheet, MOB is Non-Denominational.   Strengths:  Ability to meet basic needs , Understanding of illness, Home prepared for child , Pediatrician chosen    Risk Factors/Current Problems:  Mental Health Concerns    Cognitive State:  Able to Concentrate , Alert , Insightful , Linear Thinking    Mood/Affect:  Happy , Calm , Relaxed , Interested    CSW Assessment: CSW met with MOB to complete an assessment for a consult for MOB's MH hx.   MOB was inviting, polite, and interested in meeting with CSW.  MOB gave CSW permission to meet with MOB while FOB (Galin Vasseur) was present. CSW inquired about MOB's supports, and MOB stated that MOB and FOB's immediate and extended have been and will continue to be a source of support for the family. 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  MOB reported that MOB was dx with depression in  2010, however, has not been consistent with medication and outpatient counseling. MOB also acknowledged PPD with MOB's oldest child in 2011.  MOB communicated that MOB was prescribed medication (medication unknown) for about one year until MOB's  symptoms subsided.  MOB described social isolation, sadness, and uncontrollable crying. CSW educated MOB about PPD. CSW informed MOB of possible supports and interventions to decrease PPD.  CSW also encouraged MOB to seek medical attention if needed for increased signs, symptoms for PPD. CSW offered MOB  community [place] resources and MOB declined. CSW reviewed safe sleep and SIDS. MOB was knowledgeable and asked appropriate questions. MOB communicated that she has a crib for the baby and feels excited about being a new mother again. CSW thanked MOB for her willingness to meet with CSW.  MOB did not have any further questions, concerns, or needs at this time.    CSW Plan/Description:  Information/Referral to [employer] , No Further Intervention Required/No Barriers to Discharge, Patient/Family Education    Laurey Arrow, MSW, [employer] Clinical Social Work [phone]   Dimple Nanas, LCSW 05/08/17, 11:45 AM

## 2016-08-22 ENCOUNTER — Encounter: Payer: Medicaid Other | Admitting: Obstetrics and Gynecology

## 2016-08-22 LAB — RH IG WORKUP (INCLUDES ABO/RH)
ABO/RH(D): O NEG
Fetal Screen: NEGATIVE
GESTATIONAL AGE(WKS): 40
Unit division: 0

## 2016-08-22 MED ORDER — IBUPROFEN 600 MG PO TABS: 600.0000 mg | ORAL_TABLET | Freq: Four times a day (QID) | ORAL | 0 refills | Status: DC | PRN

## 2016-08-22 MED ORDER — DOCUSATE SODIUM 100 MG PO CAPS: 100.0000 mg | ORAL_CAPSULE | Freq: Two times a day (BID) | ORAL | 0 refills | Status: DC

## 2016-08-24 LAB — TYPE AND SCREEN
BLOOD PRODUCT EXPIRATION DATE: 201801272359
UNIT TYPE AND RH: 9500

## 2016-09-26 ENCOUNTER — Encounter: Payer: Self-pay | Admitting: Obstetrics and Gynecology

## 2016-09-26 ENCOUNTER — Encounter: Payer: Self-pay | Admitting: Student

## 2016-09-26 ENCOUNTER — Ambulatory Visit (INDEPENDENT_AMBULATORY_CARE_PROVIDER_SITE_OTHER): Payer: Medicaid Other | Admitting: Student

## 2016-09-26 ENCOUNTER — Encounter: Payer: Self-pay | Admitting: *Deleted

## 2016-09-26 VITALS — BP 107/59 | HR 71 | Ht 62.0 in | Wt 103.5 lb

## 2016-09-26 DIAGNOSIS — A749 Chlamydial infection, unspecified: Secondary | ICD-10-CM

## 2016-09-26 DIAGNOSIS — O09899 Supervision of other high risk pregnancies, unspecified trimester: Secondary | ICD-10-CM

## 2016-09-26 DIAGNOSIS — B9689 Other specified bacterial agents as the cause of diseases classified elsewhere: Secondary | ICD-10-CM

## 2016-09-26 DIAGNOSIS — N760 Acute vaginitis: Secondary | ICD-10-CM

## 2016-09-26 DIAGNOSIS — Z3483 Encounter for supervision of other normal pregnancy, third trimester: Secondary | ICD-10-CM

## 2016-09-26 DIAGNOSIS — Z283 Underimmunization status: Secondary | ICD-10-CM

## 2016-09-26 DIAGNOSIS — O98813 Other maternal infectious and parasitic diseases complicating pregnancy, third trimester: Principal | ICD-10-CM

## 2016-09-26 DIAGNOSIS — O9989 Other specified diseases and conditions complicating pregnancy, childbirth and the puerperium: Secondary | ICD-10-CM

## 2016-09-26 LAB — POCT PREGNANCY, URINE: PREG TEST UR: NEGATIVE

## 2016-09-26 NOTE — Progress Notes (Signed)
Subjective:     Marcia Jackson is a 26 y.o. female who presents for a postpartum visit. She is 5 weeks postpartum following a spontaneous vaginal delivery. I have fully reviewed the prenatal and intrapartum course. The delivery was at 40 gestational weeks. Outcome: spontaneous vaginal delivery. Anesthesia: epidural. Postpartum course has been unremarkable. Baby's course has been unremarkable. Baby is feeding by bottle - Similac Isomil. Bleeding red. Bowel function is normal. Bladder function is normal. Patient is sexually active. Contraception method is none. Postpartum depression screening: negative.  The following portions of the patient's history were reviewed and updated as appropriate: allergies, current medications, past family history, past medical history, past social history, past surgical history and problem list.  Review of Systems Pertinent items are noted in HPI.   Objective:    BP (!) 107/59   Pulse 71   Ht '5\' 2"'  (1.575 m)   Wt 103 lb 8 oz (46.9 kg)   LMP 09/19/2016 (Exact Date)   Breastfeeding? No   BMI 18.93 kg/m   General:  alert, cooperative and no distress   Breasts:  negative  Lungs: clear to auscultation bilaterally  Heart:  regular rate and rhythm, S1, S2 normal, no murmur, click, rub or gallop  Abdomen: soft, non-tender; bowel sounds normal; no masses,  no organomegaly   Vulva:  not evaluated  Vagina: not evaluated  Cervix:  not evaluated  Corpus: not examined  Adnexa:  not evaluated  Rectal Exam: Not performed.        Assessment:    Normal postpartum exam. Pap smear not done at today's visit.  S/P MMR post partum  Plan:    1. Contraception: Depo-Provera injections 2. Follow up in: 2 weeks for pregnancy test and depo and then 3 months after that for depo week or as needed.

## 2016-09-26 NOTE — Patient Instructions (Signed)
Hormonal Contraception Information Introduction Estrogen and progesterone (progestin) are hormones used in many forms of birth control (contraception). These two hormones make up most hormonal contraceptives. Hormonal contraceptives use either:  A combination of estrogen hormone and progesterone hormone in one of these forms:  Pill. Pills come in various combinations of active hormone pills and nonhormonal pills. Different combinations of pills may give you a period once a month, once every 3 months, or no period at all. It is important to take the pills the same time each day.  Patch. The patch is placed on the lower abdomen every week for 3 weeks. On the fourth week, the patch is not placed.  Vaginal ring. The ring is placed in the vagina and left there for 3 weeks. It is then removed for 1 week.  Progesterone alone in one of these forms:  Pill. Hormone pills are taken every day of the cycle.  Intrauterine device (IUD). The IUD is inserted during a menstrual period and removed or replaced every 5 years or sooner.  Implant. Plastic rods are placed under the skin of the upper arm. They are removed or replaced every 3 years or sooner.  Injection. The injection is given once every 90 days. Pregnancy can still occur with any of these hormonal contraceptive methods. If you have any suspicion that you might be pregnant, take a pregnancy test and talk to your health care provider. Estrogen and progesterone contraceptives Estrogen and progesterone contraceptives can prevent pregnancy by:  Stopping the release of an egg (ovulation).  Thickening the mucus of the cervix, making it difficult for sperm to enter the uterus.  Changing the lining of the uterus. This change makes it more difficult for an egg to implant. Progesterone contraceptives Progesterone-only contraceptives can prevent pregnancy by:  Blocking ovulation. This occurs in many women, but some women will continue to  ovulate.  Preventing the entry of sperm into the uterus by keeping the cervical mucus thick and sticky.  Changing the lining of the uterus. This change makes it more difficult for an egg to implant. Side effects Talk to your health care provider about what side effects may affect you. If you develop persistent side effects or if the effects are severe, talk to your health care provider.  Estrogen. Side effects from estrogen occur more often in the first 2-3 months. They include:  Progesterone. Side effects of progesterone can vary. They include: Questions to ask This information is not intended to replace advice given to you by your health care provider. Make sure you discuss any questions you have with your health care provider. Document Released: 08/18/2007 Document Revised: 05/01/2016 Document Reviewed: 01/10/2013  2017 Elsevier  

## 2016-10-10 ENCOUNTER — Ambulatory Visit (INDEPENDENT_AMBULATORY_CARE_PROVIDER_SITE_OTHER): Payer: Medicaid Other | Admitting: General Practice

## 2016-10-10 DIAGNOSIS — Z3202 Encounter for pregnancy test, result negative: Secondary | ICD-10-CM | POA: Diagnosis not present

## 2016-10-10 DIAGNOSIS — Z309 Encounter for contraceptive management, unspecified: Secondary | ICD-10-CM | POA: Diagnosis present

## 2016-10-10 DIAGNOSIS — Z30013 Encounter for initial prescription of injectable contraceptive: Secondary | ICD-10-CM

## 2016-10-10 LAB — POCT PREGNANCY, URINE: PREG TEST UR: NEGATIVE

## 2016-10-10 MED ORDER — MEDROXYPROGESTERONE ACETATE 150 MG/ML IM SUSP
150.0000 mg | INTRAMUSCULAR | Status: AC
  Administered 2016-10-10 – 2017-03-13 (×3): 150 mg via INTRAMUSCULAR

## 2016-10-10 NOTE — Progress Notes (Signed)
Patient here to start depo today. upt negative. Informed patient to use protection for next 2 weeks.

## 2016-12-26 ENCOUNTER — Ambulatory Visit (INDEPENDENT_AMBULATORY_CARE_PROVIDER_SITE_OTHER): Payer: Medicaid Other | Admitting: *Deleted

## 2016-12-26 DIAGNOSIS — Z3042 Encounter for surveillance of injectable contraceptive: Secondary | ICD-10-CM

## 2017-02-03 ENCOUNTER — Ambulatory Visit: Payer: Medicaid Other | Admitting: Obstetrics and Gynecology

## 2017-02-03 DIAGNOSIS — Z308 Encounter for other contraceptive management: Secondary | ICD-10-CM | POA: Insufficient documentation

## 2017-02-03 NOTE — Progress Notes (Signed)
Pt is currently taking Depo Provera and has been taken for two injections and wanted to know about Nexplanon.  Pt stated that she is currently having irregular bleeding.  I advised pt that she could give it a couple more injections because the Nexplanon symptom is irregular bleeding.  Pt stated that she is willing to wait a couple more injection before deciding to switch.  Pt made injection appt and had no further questions.

## 2017-03-03 ENCOUNTER — Emergency Department (HOSPITAL_COMMUNITY)
Admission: EM | Admit: 2017-03-03 | Discharge: 2017-03-03 | Disposition: A | Discharge status: Home / Self Care | Payer: Self-pay | Attending: Emergency Medicine | Admitting: Emergency Medicine

## 2017-03-03 ENCOUNTER — Encounter (HOSPITAL_COMMUNITY): Payer: Self-pay | Admitting: *Deleted

## 2017-03-03 ENCOUNTER — Emergency Department (HOSPITAL_COMMUNITY): Payer: Self-pay

## 2017-03-03 DIAGNOSIS — W010XXA Fall on same level from slipping, tripping and stumbling without subsequent striking against object, initial encounter: Secondary | ICD-10-CM | POA: Insufficient documentation

## 2017-03-03 DIAGNOSIS — R2242 Localized swelling, mass and lump, left lower limb: Secondary | ICD-10-CM | POA: Insufficient documentation

## 2017-03-03 DIAGNOSIS — Y92007 Garden or yard of unspecified non-institutional (private) residence as the place of occurrence of the external cause: Secondary | ICD-10-CM | POA: Insufficient documentation

## 2017-03-03 DIAGNOSIS — Y998 Other external cause status: Secondary | ICD-10-CM | POA: Insufficient documentation

## 2017-03-03 DIAGNOSIS — F17210 Nicotine dependence, cigarettes, uncomplicated: Secondary | ICD-10-CM | POA: Insufficient documentation

## 2017-03-03 DIAGNOSIS — M79672 Pain in left foot: Secondary | ICD-10-CM | POA: Insufficient documentation

## 2017-03-03 DIAGNOSIS — Y9302 Activity, running: Secondary | ICD-10-CM | POA: Insufficient documentation

## 2017-03-03 MED ORDER — ACETAMINOPHEN 500 MG PO TABS
1000.0000 mg | ORAL_TABLET | Freq: Once | ORAL | Status: AC
  Administered 2017-03-03: 1000 mg via ORAL
  Filled 2017-03-03: qty 2

## 2017-03-03 NOTE — ED Provider Notes (Signed)
[facility] Provider Note   CSN: [id number] Arrival date & time: 03/03/17  [id number]   By signing my name below, I, Soijett Blue, attest that this documentation has been prepared under the direction and in the presence of Sharen Hecklaudia Dunia Pringle, PA-C Electronically Signed: Soijett Blue, ED Scribe. 03/03/17. 11:56 AM.  History   Chief Complaint Chief Complaint  Patient presents with  . Foot Pain    HPI Marcia Jackson is a 26 y.o. female who presents to the Emergency Department complaining of left foot pain onset yesterday. Pt reports associated left foot swelling. Pt has not tried any medications for the relief of her symptoms. She notes that she was running around her yard when she felt sudden onset pain to the top of her left foot after she inverted it. She denies color change, wound, and any other symptoms.     The history is provided by the patient. No language interpreter was used.    Past Medical History:  Diagnosis Date  . Depression     Patient Active Problem List   Diagnosis Date Noted  . Encounter for other contraceptive management 02/03/2017  . Rh negative, antepartum 05/17/2016  . Rubella non-immune status, antepartum 05/17/2016  . Family history of congenital heart defect 05/07/2016    Past Surgical History:  Procedure Laterality Date  . NO PAST SURGERIES      OB History    Gravida Para Term Preterm AB Living   2 2 2     2    SAB TAB Ectopic Multiple Live Births         0 2       Home Medications    Prior to Admission medications   Medication Sig Start Date End Date Taking? Authorizing Provider  acetaminophen (TYLENOL) 500 MG tablet Take 1,000 mg by mouth every 6 (six) hours as needed for mild pain or headache.    [provider]  docusate sodium (COLACE) 100 MG capsule Take 1 capsule (100 mg total) by mouth 2 (two) times daily. Patient not taking: Reported on 09/26/2016 08/22/16   Renne MuscaWarden, Daniel L, MD  ibuprofen (ADVIL,MOTRIN) 600 MG tablet  Take 1 tablet (600 mg total) by mouth every 6 (six) hours as needed for mild pain or moderate pain. 08/22/16   Renne MuscaWarden, Daniel L, MD  Prenatal Vit-Fe Fumarate-FA (PRENATAL MULTIVITAMIN) TABS tablet Take 1 tablet by mouth daily.     [provider]    Family History Family History  Problem Relation Age of Onset  . Diabetes Maternal Aunt   . Cancer Maternal Grandmother   . Diabetes Maternal Grandmother     Social History Social History  Substance Use Topics  . Smoking status: Current Every Day Smoker    Packs/day: 0.25    Types: Cigarettes  . Smokeless tobacco: Never Used  . Alcohol use Yes     Comment: occasionally     Allergies   Patient has no known allergies.   Review of Systems Review of Systems  Musculoskeletal: Positive for arthralgias (left foot) and joint swelling (left foot).  Skin: Negative for color change and wound.     Physical Exam Updated Vital Signs BP (!) 124/93 (BP Location: Left Arm)   Pulse 92   Temp 97.9 F (36.6 C) (Oral)   Resp 16   Ht 5\' 2"  (1.575 m)   Wt 104 lb (47.2 kg)   LMP 03/03/2017   SpO2 98%   BMI 19.02 kg/m   Physical Exam  Constitutional: She  is oriented to person, place, and time. She appears well-developed and well-nourished. No distress.  NAD.  HENT:  Head: Normocephalic and atraumatic.  Right Ear: External ear normal.  Left Ear: External ear normal.  Nose: Nose normal.  Eyes: Conjunctivae and EOM are normal. No scleral icterus.  Neck: Normal range of motion. Neck supple.  Cardiovascular: Normal rate, regular rhythm and normal heart sounds.  Exam reveals no gallop and no friction rub.   No murmur heard. Pulses:      Dorsalis pedis pulses are 2+ on the right side, and 2+ on the left side.  Pulmonary/Chest: Effort normal and breath sounds normal. No respiratory distress. She has no wheezes. She has no rales.  Musculoskeletal: Normal range of motion. She exhibits tenderness. She exhibits no deformity.       Left  foot: There is tenderness and swelling.  Left foot: Focal tenderness to 2nd and 3rd metatarsal with mild edema to anterior midfoot. No ecchymosis or skin changes over this area. Pain with dorsi flexion and inversion. No point tenderness to navicular bone, base of 5th toes, calcaneus, or malleoli.   Neurological: She is alert and oriented to person, place, and time. She has normal strength. No sensory deficit.  Sensation intact to light touch to bilateral feet. 5/5 strength with flexion and extension of bilateral ankles.   Skin: Skin is warm and dry. Capillary refill takes less than 2 seconds.  Psychiatric: She has a normal mood and affect. Her behavior is normal. Judgment and thought content normal.  Nursing note and vitals reviewed.    ED Treatments / Results  DIAGNOSTIC STUDIES: Oxygen Saturation is 98% on RA, nl by my interpretation.    COORDINATION OF CARE: 11:56 AM Discussed treatment plan with pt at bedside and pt agreed to plan.   Labs (all labs ordered are listed, but only abnormal results are displayed) Labs Reviewed - No data to display  EKG  EKG Interpretation None       Radiology Dg Foot Complete Left  Result Date: 03/03/2017 CLINICAL DATA:  ROLLED LEFT FOOT LAST PM WHILE RUNNING.PAIN PROXIMAL MID METATARSALS EXAM: LEFT FOOT - COMPLETE 3+ VIEW COMPARISON:  None. FINDINGS: There is no evidence of fracture or dislocation. There is no evidence of arthropathy or other focal bone abnormality. Incidental note made of a benign lipoma in the calcaneus, versus normal variant lucency. Soft tissues are unremarkable. IMPRESSION: No acute findings.  No osseous fracture or dislocation. Electronically Signed   By: Bary Richard M.D.   On: 03/03/2017 12:50    Procedures Procedures (including critical care time)  Medications Ordered in ED Medications  acetaminophen (TYLENOL) tablet 1,000 mg (1,000 mg Oral Given 03/03/17 1246)     Initial Impression / Assessment and Plan / ED  Course  I have reviewed the triage vital signs and the nursing notes.  Pertinent labs & imaging results that were available during my care of the patient were reviewed by me and considered in my medical decision making (see chart for details).  Clinical Course as of Mar 03 1254  Mon Mar 03, 2017  1200 Patient unable to stay for x-ray, asking for discharge   [CG]  1228 Patient unable to wait for x-ray, has to pick up daughter   [CG]    Clinical Course User Index [CG] Liberty Handy, PA-C    Low suspicion for fracture given mechanism of injury and physical exam, however will get x-ray to r/o fx.  X-ray not ordered at triage,  will order now.   Final Clinical Impressions(s) / ED Diagnoses   Final diagnoses:  Foot pain, left   X-ray negative. Extremity is NVI. Will advise conservative tx for pain. F/u with orthopedic for worsening or persistent pain. Discussed results with patient, she is agreeable with discharge plan.  New Prescriptions New Prescriptions   No medications on file   I personally performed the services described in this documentation, which was scribed in my presence. The recorded information has been reviewed and is accurate.     Liberty Handy, PA-C 03/03/17 1235    Jerrell Mylar 03/03/17 1255    Azalia Bilis, MD 03/04/17 [id number]

## 2017-03-03 NOTE — Discharge Instructions (Addendum)
You were evaluated in the emergency department for left midfoot pain. Given the mechanism of injury and pain along the long bones of your foot we recommended an x-ray to rule out fractures and dislocations. Your x-rays were negative and you do not have any fractures or dislocations. Take ibuprofen or Tylenol, ice, rest, elevate.

## 2017-03-03 NOTE — ED Triage Notes (Signed)
Pt states she twisted L foot while running with daughter.

## 2017-03-03 NOTE — ED Notes (Signed)
Pt given an ice pack. Acknowledges leaving today with all of her belongings. No questions about DC instructions.

## 2017-03-13 ENCOUNTER — Ambulatory Visit (INDEPENDENT_AMBULATORY_CARE_PROVIDER_SITE_OTHER): Payer: Medicaid Other

## 2017-03-13 VITALS — BP 120/88 | HR 72

## 2017-03-13 DIAGNOSIS — Z308 Encounter for other contraceptive management: Secondary | ICD-10-CM

## 2017-03-13 DIAGNOSIS — Z3042 Encounter for surveillance of injectable contraceptive: Secondary | ICD-10-CM

## 2017-03-13 NOTE — Progress Notes (Signed)
Patient presented to the office today for depo-provera. Patient tolerated well and will follow up in three months for her second injection.

## 2017-05-29 ENCOUNTER — Ambulatory Visit (INDEPENDENT_AMBULATORY_CARE_PROVIDER_SITE_OTHER): Payer: Medicaid Other | Admitting: General Practice

## 2017-05-29 VITALS — BP 105/62 | HR 71 | Ht 64.0 in | Wt 109.0 lb

## 2017-05-29 DIAGNOSIS — Z3042 Encounter for surveillance of injectable contraceptive: Secondary | ICD-10-CM

## 2017-05-29 DIAGNOSIS — Z309 Encounter for contraceptive management, unspecified: Secondary | ICD-10-CM

## 2017-05-29 MED ORDER — MEDROXYPROGESTERONE ACETATE 150 MG/ML IM SUSP
150.0000 mg | Freq: Once | INTRAMUSCULAR | Status: AC
Start: 2017-05-29 — End: 2017-05-29
  Administered 2017-05-29: 150 mg via INTRAMUSCULAR

## 2017-05-29 NOTE — Progress Notes (Signed)
Chart reviewed for nurse visit. Agree with plan of care.   Marylene LandKooistra, Nailyn Dearinger Lorraine, CNM 05/29/2017 10:05 AM

## 2017-06-26 ENCOUNTER — Encounter: Payer: Self-pay | Admitting: Advanced Practice Midwife

## 2017-06-26 ENCOUNTER — Ambulatory Visit (INDEPENDENT_AMBULATORY_CARE_PROVIDER_SITE_OTHER): Payer: Medicaid Other | Admitting: Advanced Practice Midwife

## 2017-06-26 VITALS — BP 114/68 | HR 65 | Ht 62.0 in | Wt 108.2 lb

## 2017-06-26 DIAGNOSIS — Z309 Encounter for contraceptive management, unspecified: Secondary | ICD-10-CM | POA: Diagnosis not present

## 2017-06-26 DIAGNOSIS — Z30019 Encounter for initial prescription of contraceptives, unspecified: Secondary | ICD-10-CM

## 2017-06-26 LAB — POCT PREGNANCY, URINE: PREG TEST UR: NEGATIVE

## 2017-06-26 MED ORDER — ETONOGESTREL 68 MG ~~LOC~~ IMPL
68.0000 mg | DRUG_IMPLANT | Freq: Once | SUBCUTANEOUS | Status: AC
  Administered 2017-06-26: 68 mg via SUBCUTANEOUS

## 2017-06-28 NOTE — Progress Notes (Signed)
Patient Marcia LanJessica L Jackson is a 26 y.o. [phone] here for Nexplanon insertion. POCT is negative.   Vitals:   06/26/17 0912  BP: 114/68  Pulse: 65    NEXPLANON INSERTION: Appropriate time out taken. Nexlanon site (left arm) identified and the area was prepped in usual sterile fashon. 2 cc of 1% lidocaine was used to anesthetize the area starting with the distal end.   Next, the area was cleansed with betadine and the Nexplanon was inserted without difficulty.  Pressure bandage was applied. Patient palpated Nexplanon and noted the location.   Pt was instructed to remove pressure bandage in a few hours, and keep insertion site covered with a bandaid for 3 days.   All questions answered; patient stable for discharge.   Luna KitchensKathryn Kooistra CNM

## 2017-11-25 ENCOUNTER — Encounter: Payer: Self-pay | Admitting: *Deleted

## 2017-12-24 ENCOUNTER — Ambulatory Visit (HOSPITAL_COMMUNITY)
Admission: EM | Admit: 2017-12-24 | Discharge: 2017-12-24 | Disposition: A | Discharge status: Home / Self Care | Payer: Medicaid Other | Attending: Family Medicine | Admitting: Family Medicine

## 2017-12-24 ENCOUNTER — Encounter (HOSPITAL_COMMUNITY): Payer: Self-pay | Admitting: Emergency Medicine

## 2017-12-24 ENCOUNTER — Other Ambulatory Visit: Payer: Self-pay

## 2017-12-24 DIAGNOSIS — L02412 Cutaneous abscess of left axilla: Secondary | ICD-10-CM

## 2017-12-24 MED ORDER — IBUPROFEN 800 MG PO TABS: 800.0000 mg | ORAL_TABLET | Freq: Three times a day (TID) | ORAL | 0 refills | Status: DC

## 2017-12-24 MED ORDER — DOXYCYCLINE HYCLATE 100 MG PO CAPS: 100.0000 mg | ORAL_CAPSULE | Freq: Two times a day (BID) | ORAL | 0 refills | Status: AC

## 2017-12-24 NOTE — ED Triage Notes (Signed)
The patient presented to the [facility] with a complaint of a possible abscess under her left arm. 

## 2017-12-24 NOTE — Discharge Instructions (Signed)
Begin doxycycline twice daily for 10 days  Continue warm compresses and warm soaks with massage especially over the next couple of days to express further drainage  Please return if symptoms not improving or worsening.

## 2017-12-24 NOTE — ED Provider Notes (Signed)
[facility]    CSN: [id number] Arrival date & time: 12/24/17  1236     History   Chief Complaint Chief Complaint  Patient presents with  . Abscess    HPI Marcia Jackson is a 27 y.o. female no contributing past medical history presenting today for evaluation of an abscess.  Patient states that she is not abscess to her left axilla for the past week.  Is continued to grow in size and increase in pain.  She has been doing warm compresses without relief.  She denies any history of previous abscesses to her axilla or other parts of her body.  HPI  Past Medical History:  Diagnosis Date  . Depression     Patient Active Problem List   Diagnosis Date Noted  . Encounter for other contraceptive management 02/03/2017  . Rh negative, antepartum 05/17/2016  . Rubella non-immune status, antepartum 05/17/2016  . Family history of congenital heart defect 05/07/2016    Past Surgical History:  Procedure Laterality Date  . NO PAST SURGERIES      OB History    Gravida  2   Para  2   Term  2   Preterm      AB      Living  2     SAB      TAB      Ectopic      Multiple  0   Live Births  2            Home Medications    Prior to Admission medications   Medication Sig Start Date End Date Taking? Authorizing Provider  doxycycline (VIBRAMYCIN) 100 MG capsule Take 1 capsule (100 mg total) by mouth 2 (two) times daily for 10 days. 12/24/17 01/03/18  Dejaun Vidrio C, PA-C  ibuprofen (ADVIL,MOTRIN) 800 MG tablet Take 1 tablet (800 mg total) by mouth 3 (three) times daily. 12/24/17   Gerrett Loman, Junius Creamer, PA-C    Family History Family History  Problem Relation Age of Onset  . Diabetes Maternal Aunt   . Cancer Maternal Grandmother   . Diabetes Maternal Grandmother     Social History Social History   Tobacco Use  . Smoking status: Current Every Day Smoker    Packs/day: 0.25    Types: Cigarettes  . Smokeless tobacco: Never Used  Substance Use Topics    . Alcohol use: Yes    Comment: occasionally  . Drug use: No    Types: Marijuana    Comment: quit when found out pregnant      Allergies   Patient has no known allergies.   Review of Systems Review of Systems  Constitutional: Negative for fatigue and fever.  HENT: Negative for mouth sores.   Eyes: Negative for visual disturbance.  Respiratory: Negative for shortness of breath.   Cardiovascular: Negative for chest pain.  Gastrointestinal: Negative for abdominal pain, nausea and vomiting.  Genitourinary: Negative for genital sores.  Musculoskeletal: Negative for arthralgias and joint swelling.  Skin: Positive for color change. Negative for rash and wound.       Abscess  Neurological: Negative for dizziness, weakness, light-headedness and headaches.     Physical Exam Triage Vital Signs ED Triage Vitals  Enc Vitals Group     BP 12/24/17 1246 99/74     Pulse Rate 12/24/17 1246 84     Resp 12/24/17 1246 18     Temp 12/24/17 1246 98 F (36.7 C)     Temp Source 12/24/17  1246 Oral     SpO2 12/24/17 1246 100 %     Weight --      Height --      Head Circumference --      Peak Flow --      Pain Score 12/24/17 1244 6     Pain Loc --      Pain Edu? --      Excl. in GC? --    No data found.  Updated Vital Signs BP 99/74 (BP Location: Right Arm)   Pulse 84   Temp 98 F (36.7 C) (Oral)   Resp 18   LMP 12/09/2017 (Exact Date)   SpO2 100%   Breastfeeding? No   Visual Acuity Right Eye Distance:   Left Eye Distance:   Bilateral Distance:    Right Eye Near:   Left Eye Near:    Bilateral Near:     Physical Exam  Constitutional: She appears well-developed and well-nourished. No distress.  HENT:  Head: Normocephalic and atraumatic.  Eyes: Conjunctivae are normal.  Neck: Neck supple.  Cardiovascular: Normal rate.  Pulmonary/Chest: Effort normal. No respiratory distress.  Abdominal: Soft. There is no tenderness.  Musculoskeletal: She exhibits no edema.   Neurological: She is alert.  Skin: Skin is warm and dry.  2 cm abscess with overlying erythema, mild fluctuance, tenderness to left axilla  Psychiatric: She has a normal mood and affect.  Nursing note and vitals reviewed.    UC Treatments / Results  Labs (all labs ordered are listed, but only abnormal results are displayed) Labs Reviewed - No data to display  EKG None  Radiology No results found.  Procedures Incision and Drainage Date/Time: 12/24/2017 1:32 PM Performed by: Evolett Somarriba, Junius Creamer, PA-C Authorized by: Mardella Layman, MD   Consent:    Consent obtained:  Verbal   Consent given by:  Patient   Risks discussed:  Bleeding, incomplete drainage and pain   Alternatives discussed:  Alternative treatment Location:    Type:  Abscess   Size:  2   Location:  Upper extremity   Upper extremity location: Left axilla. Pre-procedure details:    Skin preparation:  Betadine Anesthesia (see MAR for exact dosages):    Anesthesia method:  Local infiltration   Local anesthetic:  Lidocaine 2% WITH epi Procedure type:    Complexity:  Simple Procedure details:    Needle aspiration: no     Incision types:  Stab incision   Incision depth:  Subcutaneous   Scalpel blade:  11   Drainage:  Bloody and purulent   Drainage amount:  Moderate   Wound treatment:  Wound left open   Packing materials:  None Post-procedure details:    Patient tolerance of procedure:  Tolerated well, no immediate complications   (including critical care time)  Medications Ordered in UC Medications - No data to display  Initial Impression / Assessment and Plan / UC Course  I have reviewed the triage vital signs and the nursing notes.  Pertinent labs & imaging results that were available during my care of the patient were reviewed by me and considered in my medical decision making (see chart for details).     Patient with left axilla abscess, non-recurrent.  I&D performed, will send home with  doxycycline and warm compresses and soaking. Discussed strict return precautions. Patient verbalized understanding and is agreeable with plan.  Final Clinical Impressions(s) / UC Diagnoses   Final diagnoses:  Abscess of left axilla     Discharge Instructions  Begin doxycycline twice daily for 10 days  Continue warm compresses and warm soaks with massage especially over the next couple of days to express further drainage  Please return if symptoms not improving or worsening.   ED Prescriptions    Medication Sig Dispense Auth. Provider   doxycycline (VIBRAMYCIN) 100 MG capsule Take 1 capsule (100 mg total) by mouth 2 (two) times daily for 10 days. 20 capsule Mollye Guinta C, PA-C   ibuprofen (ADVIL,MOTRIN) 800 MG tablet Take 1 tablet (800 mg total) by mouth 3 (three) times daily. 21 tablet Erienne Spelman, [place] C, PA-C     Controlled Substance Prescriptions [place] Controlled Substance Registry consulted? Not Applicable   Lew Dawes, [place] 12/24/17 1334

## 2018-01-19 ENCOUNTER — Emergency Department (HOSPITAL_COMMUNITY)
Admission: EM | Admit: 2018-01-19 | Discharge: 2018-01-19 | Disposition: A | Discharge status: Home / Self Care | Payer: Medicaid Other | Attending: Emergency Medicine | Admitting: Emergency Medicine

## 2018-01-19 ENCOUNTER — Other Ambulatory Visit: Payer: Self-pay

## 2018-01-19 ENCOUNTER — Encounter (HOSPITAL_COMMUNITY): Payer: Self-pay

## 2018-01-19 DIAGNOSIS — L03113 Cellulitis of right upper limb: Secondary | ICD-10-CM

## 2018-01-19 DIAGNOSIS — F17210 Nicotine dependence, cigarettes, uncomplicated: Secondary | ICD-10-CM | POA: Insufficient documentation

## 2018-01-19 DIAGNOSIS — Z79899 Other long term (current) drug therapy: Secondary | ICD-10-CM | POA: Insufficient documentation

## 2018-01-19 MED ORDER — DOXYCYCLINE HYCLATE 100 MG PO CAPS: 100.0000 mg | ORAL_CAPSULE | Freq: Two times a day (BID) | ORAL | 0 refills | Status: DC

## 2018-01-19 NOTE — Discharge Planning (Signed)
EDCM enrolled pt in MATCH to assist pt with cost of antibiotic  ED CM consulted by EDP [place] for medication assistance.  [facility].  Pt has [employer]ILY [employer] MEDICAID that only covers birth control and hormonal Rx.  EDCM enrolled pt in MATCH to assist pt with cost of antibiotic.

## 2018-01-19 NOTE — ED Triage Notes (Signed)
Pt presents with abscess to right inner bend of arm. Pt states she thinks it was a bug bite but denies seeing anything bite her. C/O swelling and pain to the area.

## 2018-01-19 NOTE — Discharge Instructions (Signed)
Use warm compresses as discussed

## 2018-01-19 NOTE — ED Provider Notes (Signed)
[facility] EMERGENCY DEPARTMENT Provider Note   CSN: [id number] Arrival date & time: 01/19/18  0841     History   Chief Complaint Chief Complaint  Patient presents with  . Abscess    HPI Marcia Jackson is a 27 y.o. female.  Pt comes in with c/o redness and and swelling to the right ac. She states that it started as a bug bite several days ago and now the redness is getting worse. She state that it is no drainage. Hasn't tried anything. No history of similar     Past Medical History:  Diagnosis Date  . Depression     Patient Active Problem List   Diagnosis Date Noted  . Encounter for other contraceptive management 02/03/2017  . Rh negative, antepartum 05/17/2016  . Rubella non-immune status, antepartum 05/17/2016  . Family history of congenital heart defect 05/07/2016    Past Surgical History:  Procedure Laterality Date  . NO PAST SURGERIES       OB History    Gravida  2   Para  2   Term  2   Preterm      AB      Living  2     SAB      TAB      Ectopic      Multiple  0   Live Births  2            Home Medications    Prior to Admission medications   Medication Sig Start Date End Date Taking? Authorizing Provider  ibuprofen (ADVIL,MOTRIN) 800 MG tablet Take 1 tablet (800 mg total) by mouth 3 (three) times daily. 12/24/17   Wieters, Junius Creamer, PA-C    Family History Family History  Problem Relation Age of Onset  . Diabetes Maternal Aunt   . Cancer Maternal Grandmother   . Diabetes Maternal Grandmother     Social History Social History   Tobacco Use  . Smoking status: Current Every Day Smoker    Packs/day: 0.25    Types: Cigarettes  . Smokeless tobacco: Never Used  Substance Use Topics  . Alcohol use: Yes    Comment: occasionally  . Drug use: No    Types: Marijuana    Comment: quit when found out pregnant      Allergies   Patient has no known allergies.   Review of Systems Review of Systems  All  other systems reviewed and are negative.    Physical Exam Updated Vital Signs BP 110/85 (BP Location: Left Arm)   Pulse 78   Temp 98.2 F (36.8 C) (Oral)   Resp 16   Ht 5\' 2"  (1.575 m)   Wt 51.3 kg (113 lb)   LMP 12/22/2017   SpO2 99%   BMI 20.67 kg/m   Physical Exam  Constitutional: She is oriented to person, place, and time. She appears well-developed and well-nourished.  Cardiovascular: Normal rate.  Pulmonary/Chest: Effort normal.  Musculoskeletal: Normal range of motion.  Neurological: She is alert and oriented to person, place, and time.  Skin:  Small area of firmness about 1/2 cm in diameter with larger area of redness noted. Full rom of elbow   Nursing note and vitals reviewed.    ED Treatments / Results  Labs (all labs ordered are listed, but only abnormal results are displayed) Labs Reviewed - No data to display  EKG None  Radiology No results found.  Procedures Procedures (including critical care time)  Medications Ordered  in ED Medications - No data to display   Initial Impression / Assessment and Plan / ED Course  I have reviewed the triage vital signs and the nursing notes.  Pertinent labs & imaging results that were available during my care of the patient were reviewed by me and considered in my medical decision making (see chart for details).    Will treat with doxy. Care management involved to get medication for patient  Final Clinical Impressions(s) / ED Diagnoses   Final diagnoses:  None    ED Discharge Orders    None       Teressa Lowerickering, Zarah Carbon, NP 01/19/18 [id number]    Donnetta Hutchingook, Brian, MD 01/21/18 1056

## 2018-05-12 ENCOUNTER — Other Ambulatory Visit: Payer: Self-pay

## 2018-05-12 ENCOUNTER — Emergency Department (HOSPITAL_COMMUNITY)
Admission: EM | Admit: 2018-05-12 | Discharge: 2018-05-12 | Disposition: A | Discharge status: Home / Self Care | Payer: Medicaid Other | Attending: Emergency Medicine | Admitting: Emergency Medicine

## 2018-05-12 ENCOUNTER — Encounter (HOSPITAL_COMMUNITY): Payer: Self-pay

## 2018-05-12 DIAGNOSIS — L0231 Cutaneous abscess of buttock: Secondary | ICD-10-CM | POA: Insufficient documentation

## 2018-05-12 DIAGNOSIS — F329 Major depressive disorder, single episode, unspecified: Secondary | ICD-10-CM | POA: Insufficient documentation

## 2018-05-12 DIAGNOSIS — F17210 Nicotine dependence, cigarettes, uncomplicated: Secondary | ICD-10-CM | POA: Insufficient documentation

## 2018-05-12 DIAGNOSIS — L03317 Cellulitis of buttock: Secondary | ICD-10-CM | POA: Insufficient documentation

## 2018-05-12 MED ORDER — LIDOCAINE-EPINEPHRINE (PF) 2 %-1:200000 IJ SOLN
20.0000 mL | Freq: Once | INTRAMUSCULAR | Status: AC
  Administered 2018-05-12: 20 mL
  Filled 2018-05-12: qty 20

## 2018-05-12 MED ORDER — CLINDAMYCIN HCL 300 MG PO CAPS: 300.0000 mg | ORAL_CAPSULE | Freq: Four times a day (QID) | ORAL | 0 refills | Status: AC

## 2018-05-12 NOTE — Discharge Instructions (Signed)
I drained the abscess today. You also have surrounding skin infection called cellulitis. I have prescribed you antibiotics, Clindamycin, that will completely treat both. Be sure to take the full course of antibiotics.  You may place warm compresses on the area after today to help with pain and residual drainage. Tylenol and/or Ibuprofen can also be used. Wash with normal soap and water.  Follow-up with a medical provider if you have one or more of the following symptoms 48-72 hours after you start the antibiotics: fever; increased redness, warmth or tenderness at the wound site; pain beyond where the initial wound was; unusual discharge.  Thank you for allowing me to take care of you today!

## 2018-05-12 NOTE — ED Provider Notes (Signed)
[facility]-EMERGENCY DEPT Provider Note  CSN: [id number] Arrival date & time: 05/12/18  1010  History   Chief Complaint Chief Complaint  Patient presents with  . Abscess   HPI Marcia Jackson is a 27 y.o. female with no significant medical history who presented to the ED for an abscess on her right buttock. She states that this has been present for 2 days. She describes the area as red and warm. Denies recent bites, open wounds or skin injuries. Denies fever, chills, abdominal pain, GU complaints, changes in bowel habits or N/V. Patient has not any tried intervention prior to coming to the ED.  Past Medical History:  Diagnosis Date  . Depression     Patient Active Problem List   Diagnosis Date Noted  . Encounter for other contraceptive management 02/03/2017  . Rh negative, antepartum 05/17/2016  . Rubella non-immune status, antepartum 05/17/2016  . Family history of congenital heart defect 05/07/2016    Past Surgical History:  Procedure Laterality Date  . NO PAST SURGERIES       OB History    Gravida  2   Para  2   Term  2   Preterm      AB      Living  2     SAB      TAB      Ectopic      Multiple  0   Live Births  2            Home Medications    Prior to Admission medications   Medication Sig Start Date End Date Taking? Authorizing Provider  clindamycin (CLEOCIN) 300 MG capsule Take 1 capsule (300 mg total) by mouth 4 (four) times daily for 7 days. 05/12/18 05/19/18  Lamont Tant, Jerrel Ivory I, PA-C  doxycycline (VIBRAMYCIN) 100 MG capsule Take 1 capsule (100 mg total) by mouth 2 (two) times daily. 01/19/18   Teressa Lower, NP  ibuprofen (ADVIL,MOTRIN) 800 MG tablet Take 1 tablet (800 mg total) by mouth 3 (three) times daily. 12/24/17   Wieters, Junius Creamer, PA-C    Family History Family History  Problem Relation Age of Onset  . Diabetes Maternal Aunt   . Cancer Maternal Grandmother   . Diabetes Maternal Grandmother      Social History Social History   Tobacco Use  . Smoking status: Current Every Day Smoker    Packs/day: 0.15    Types: Cigarettes  . Smokeless tobacco: Never Used  Substance Use Topics  . Alcohol use: Yes    Comment: occasionally  . Drug use: No    Comment: quit when found out pregnant      Allergies   Patient has no known allergies.   Review of Systems Review of Systems  Constitutional: Negative for chills and fever.  Gastrointestinal: Negative.   Genitourinary: Negative.   Musculoskeletal: Negative.   Skin: Positive for color change and wound.  Hematological: Negative.    Physical Exam Updated Vital Signs BP 104/64 (BP Location: Right Arm)   Pulse 84   Temp 98.6 F (37 C) (Oral)   Resp 16   Ht 5\' 2"  (1.575 m)   Wt 52.6 kg   SpO2 99%   BMI 21.22 kg/m   Physical Exam  Constitutional: Vital signs are normal. She appears well-developed and well-nourished. She is cooperative. No distress.  Abdominal: Soft. Normal appearance and bowel sounds are normal. There is no tenderness.  Musculoskeletal: Normal range of motion.  Neurological: She is  alert.  Skin: Skin is warm. Capillary refill takes less than 2 seconds. No rash noted. There is erythema.     Nursing note and vitals reviewed.   ED Treatments / Results  Labs (all labs ordered are listed, but only abnormal results are displayed) Labs Reviewed - No data to display  EKG None  Radiology No results found.  Procedures .Marland KitchenIncision and Drainage Date/Time: 05/12/2018 11:21 AM Performed by: Windy Carina, PA-C Authorized by: Windy Carina, PA-C   Consent:    Consent obtained:  Verbal   Consent given by:  Patient   Risks discussed:  Bleeding, incomplete drainage, infection and pain   Alternatives discussed:  No treatment Location:    Type:  Abscess   Size:  2cm   Location:  Lower extremity   Lower extremity location:  Buttock   Buttock location:  R buttock Pre-procedure details:     Skin preparation:  Betadine Anesthesia (see MAR for exact dosages):    Anesthesia method:  Local infiltration   Local anesthetic:  Lidocaine 2% WITH epi Procedure type:    Complexity:  Simple Procedure details:    Needle aspiration: no     Incision types:  Single straight   Incision depth:  Subcutaneous   Scalpel blade:  11   Wound management:  Probed and deloculated and irrigated with saline   Drainage:  Purulent   Drainage amount:  Moderate   Wound treatment:  Wound left open   Packing materials:  None Post-procedure details:    Patient tolerance of procedure:  Tolerated well, no immediate complications   (including critical care time)  Medications Ordered in ED Medications  lidocaine-EPINEPHrine (XYLOCAINE W/EPI) 2 %-1:200000 (PF) injection 20 mL (20 mLs Infiltration Given 05/12/18 1058)    Initial Impression / Assessment and Plan / ED Course  Triage vital signs and the nursing notes have been reviewed.  Pertinent labs & imaging results that were available during care of the patient were reviewed and considered in medical decision making (see chart for details).   Patient presents in no acute distress. No systemic s/s to suggest a widespread infection or intra-abdominal process that needs to be evaluated. Simple right buttock abscess visualized on exam with surrounding erythema or warmth to suggest cellulitis. Abscess was fluctuant and not near anorectal area. I&D performed without complications.   Final Clinical Impressions(s) / ED Diagnoses  1. Right Buttock Abscess. I&D performed without complication. Rx for Clindamycin prescribed. Education on wound care, follow-up and s/s of infection given. Referral to dermatology given as patient states she has multiple abscesses and incidents of cellulitis.  Dispo: Home. After thorough clinical evaluation, this patient is determined to be medically stable and can be safely discharged with the previously mentioned treatment and/or  outpatient follow-up/referral(s). At this time, there are no other apparent medical conditions that require further screening, evaluation or treatment.   Final diagnoses:  Abscess of buttock, right  Cellulitis of buttock    ED Discharge Orders         Ordered    clindamycin (CLEOCIN) 300 MG capsule  4 times daily     05/12/18 1125            Elverta Dimiceli, [place] I, PA-C 05/12/18 1157    Charlynne Pander, MD 05/12/18 [phone]

## 2018-05-12 NOTE — ED Triage Notes (Signed)
patient c/o abscess x 2 on right buttock area x 2 days.

## 2018-06-09 ENCOUNTER — Encounter (HOSPITAL_COMMUNITY): Payer: Self-pay | Admitting: Emergency Medicine

## 2018-06-09 ENCOUNTER — Emergency Department (HOSPITAL_COMMUNITY)
Admission: EM | Admit: 2018-06-09 | Discharge: 2018-06-09 | Disposition: A | Discharge status: Home / Self Care | Payer: Medicaid Other | Attending: Emergency Medicine | Admitting: Emergency Medicine

## 2018-06-09 ENCOUNTER — Other Ambulatory Visit: Payer: Self-pay

## 2018-06-09 DIAGNOSIS — J069 Acute upper respiratory infection, unspecified: Secondary | ICD-10-CM | POA: Insufficient documentation

## 2018-06-09 DIAGNOSIS — R05 Cough: Secondary | ICD-10-CM | POA: Insufficient documentation

## 2018-06-09 DIAGNOSIS — R6883 Chills (without fever): Secondary | ICD-10-CM | POA: Insufficient documentation

## 2018-06-09 DIAGNOSIS — J3489 Other specified disorders of nose and nasal sinuses: Secondary | ICD-10-CM | POA: Insufficient documentation

## 2018-06-09 DIAGNOSIS — F17210 Nicotine dependence, cigarettes, uncomplicated: Secondary | ICD-10-CM | POA: Insufficient documentation

## 2018-06-09 DIAGNOSIS — M7918 Myalgia, other site: Secondary | ICD-10-CM | POA: Insufficient documentation

## 2018-06-09 LAB — INFLUENZA PANEL BY PCR (TYPE A & B)
Influenza A By PCR: NEGATIVE
Influenza B By PCR: NEGATIVE

## 2018-06-09 MED ORDER — BENZONATATE 100 MG PO CAPS: 100.0000 mg | ORAL_CAPSULE | Freq: Three times a day (TID) | ORAL | 0 refills | Status: AC

## 2018-06-09 NOTE — ED Provider Notes (Signed)
[facility] EMERGENCY DEPARTMENT Provider Note   CSN: [id number] Arrival date & time: 06/09/18  4098     History   Chief Complaint Chief Complaint  Patient presents with  . flu like symptoms    HPI Marcia Jackson is a 27 y.o. female.  27 y/o female with a PMH of depression presents to the ED with a chief complaint of chest congestion, rhinorrhea, cough x 3 days. Patient reports her symptoms can with the cough which has now turned into productive, along with congestion, and fever although she has not measured her temperature.  Reports she has tried Alka-Seltzer, ibuprofen.  Patient's significant other has been sick recently and she now reports body aches and chills.  She denies any abdominal pain, shortness of breath, chest pain.     Past Medical History:  Diagnosis Date  . Depression     Patient Active Problem List   Diagnosis Date Noted  . Encounter for other contraceptive management 02/03/2017  . Rh negative, antepartum 05/17/2016  . Rubella non-immune status, antepartum 05/17/2016  . Family history of congenital heart defect 05/07/2016    Past Surgical History:  Procedure Laterality Date  . NO PAST SURGERIES       OB History    Gravida  2   Para  2   Term  2   Preterm      AB      Living  2     SAB      TAB      Ectopic      Multiple  0   Live Births  2            Home Medications    Prior to Admission medications   Medication Sig Start Date End Date Taking? Authorizing Provider  benzonatate (TESSALON) 100 MG capsule Take 1 capsule (100 mg total) by mouth every 8 (eight) hours for 7 days. 06/09/18 06/16/18  Claude Manges, PA-C  doxycycline (VIBRAMYCIN) 100 MG capsule Take 1 capsule (100 mg total) by mouth 2 (two) times daily. 01/19/18   Teressa Lower, NP  ibuprofen (ADVIL,MOTRIN) 800 MG tablet Take 1 tablet (800 mg total) by mouth 3 (three) times daily. 12/24/17   Wieters, Junius Creamer, PA-C    Family History Family  History  Problem Relation Age of Onset  . Diabetes Maternal Aunt   . Cancer Maternal Grandmother   . Diabetes Maternal Grandmother     Social History Social History   Tobacco Use  . Smoking status: Current Every Day Smoker    Packs/day: 0.15    Types: Cigarettes  . Smokeless tobacco: Never Used  Substance Use Topics  . Alcohol use: Yes    Comment: occasionally  . Drug use: No    Comment: quit when found out pregnant      Allergies   Patient has no known allergies.   Review of Systems Review of Systems  Constitutional: Positive for chills and fever.  HENT: Positive for rhinorrhea.   Respiratory: Negative for shortness of breath.   Cardiovascular: Negative for chest pain.     Physical Exam Updated Vital Signs BP 113/81 (BP Location: Right Arm)   Pulse 99   Temp (S) 99.9 F (37.7 C) (Oral)   Resp 18   SpO2 98%   Physical Exam  Constitutional: She is oriented to person, place, and time. She appears well-developed and well-nourished.  HENT:  Head: Normocephalic and atraumatic.  Mouth/Throat: Uvula is midline, oropharynx is clear and  moist and mucous membranes are normal. No oropharyngeal exudate, posterior oropharyngeal edema or posterior oropharyngeal erythema.  Neck: Normal range of motion. Neck supple.  Cardiovascular: Normal heart sounds.  Pulmonary/Chest: Effort normal.  Abdominal: Soft.  Musculoskeletal: She exhibits no tenderness.  Neurological: She is alert and oriented to person, place, and time.  Skin: Skin is warm and dry.  Nursing note and vitals reviewed.    ED Treatments / Results  Labs (all labs ordered are listed, but only abnormal results are displayed) Labs Reviewed  INFLUENZA PANEL BY PCR (TYPE A & B)    EKG None  Radiology No results found.  Procedures Procedures (including critical care time)  Medications Ordered in ED Medications - No data to display   Initial Impression / Assessment and Plan / ED Course  I have  reviewed the triage vital signs and the nursing notes.  Pertinent labs & imaging results that were available during my care of the patient were reviewed by me and considered in my medical decision making (see chart for details).    Presents with URI symptoms x3 days.  She reports her boyfriend has been sick but he is unsure of what he is been sick with might be the flu.  PCR flu obtain in order to rule out any flu.  PCR flu was negative.  At this time I will discharge patient with 1 pearls to help with her cough along with Tylenol to control her fever.  During examination patient's lung sounds are normal no wheezing is observed less concern for pneumonia this is been going on for 3 days and patient does not exhibit fever in the ER.  Patient's vitals stable for discharge, patient stable for discharge.  Final Clinical Impressions(s) / ED Diagnoses   Final diagnoses:  Viral upper respiratory tract infection    ED Discharge Orders         Ordered    benzonatate (TESSALON) 100 MG capsule  Every 8 hours     06/09/18 1044           Claude Manges, PA-C 06/09/18 1125    Benjiman Core, MD 06/09/18 1558

## 2018-06-09 NOTE — Discharge Instructions (Addendum)
You flu swab was negative today.Please purchase an expectorant like Mucinex to help loosen and thin the mucus production.You may also try some over the counter Robitussin to help with your cough.  Prescribed medication that should help with your cough.  Continue to hydrate with plenty of fluids and Gatorade.If you experience any shortness of breath, chest pain or fever please return to the ED for reevaluation.

## 2018-06-09 NOTE — ED Triage Notes (Signed)
Body aches, cold symptoms, sore throat started Saturday - fever at home- has been taking ibuprofen without relief. No flu shot.

## 2018-10-01 ENCOUNTER — Encounter (HOSPITAL_COMMUNITY): Payer: Self-pay

## 2018-10-01 ENCOUNTER — Other Ambulatory Visit: Payer: Self-pay

## 2018-10-01 ENCOUNTER — Emergency Department (HOSPITAL_COMMUNITY)
Admission: EM | Admit: 2018-10-01 | Discharge: 2018-10-01 | Disposition: A | Discharge status: Home / Self Care | Payer: Medicaid Other | Attending: Emergency Medicine | Admitting: Emergency Medicine

## 2018-10-01 DIAGNOSIS — R070 Pain in throat: Secondary | ICD-10-CM | POA: Insufficient documentation

## 2018-10-01 DIAGNOSIS — R5383 Other fatigue: Secondary | ICD-10-CM | POA: Insufficient documentation

## 2018-10-01 DIAGNOSIS — R05 Cough: Secondary | ICD-10-CM | POA: Insufficient documentation

## 2018-10-01 DIAGNOSIS — R509 Fever, unspecified: Secondary | ICD-10-CM | POA: Insufficient documentation

## 2018-10-01 DIAGNOSIS — J111 Influenza due to unidentified influenza virus with other respiratory manifestations: Secondary | ICD-10-CM | POA: Insufficient documentation

## 2018-10-01 DIAGNOSIS — R69 Illness, unspecified: Secondary | ICD-10-CM

## 2018-10-01 DIAGNOSIS — F17210 Nicotine dependence, cigarettes, uncomplicated: Secondary | ICD-10-CM | POA: Insufficient documentation

## 2018-10-01 NOTE — ED Provider Notes (Signed)
[facility]-EMERGENCY DEPT Provider Note   CSN: [id number] Arrival date & time: 10/01/18  4158    History   Chief Complaint Chief Complaint  Patient presents with  . Flu-Like Symptoms    HPI Marcia Jackson is a 28 y.o. female.     The history is provided by the patient.  URI  Presenting symptoms: congestion, cough, fatigue, fever, rhinorrhea and sore throat   Severity:  Moderate Onset quality:  Gradual Duration:  2 days Timing:  Constant Progression:  Waxing and waning Chronicity:  New Relieved by:  Nothing Worsened by:  Nothing Ineffective treatments:  OTC medications Associated symptoms: myalgias   Associated symptoms: no headaches, no swollen glands and no wheezing   Risk factors: sick contacts   Risk factors: no chronic respiratory disease and no immunosuppression   Risk factors comment:  Significant other tested positive for flu yesterday   Past Medical History:  Diagnosis Date  . Depression     Patient Active Problem List   Diagnosis Date Noted  . Encounter for other contraceptive management 02/03/2017  . Rh negative, antepartum 05/17/2016  . Rubella non-immune status, antepartum 05/17/2016  . Family history of congenital heart defect 05/07/2016    Past Surgical History:  Procedure Laterality Date  . NO PAST SURGERIES       OB History    Gravida  2   Para  2   Term  2   Preterm      AB      Living  2     SAB      TAB      Ectopic      Multiple  0   Live Births  2            Home Medications    Prior to Admission medications   Medication Sig Start Date End Date Taking? Authorizing Provider  doxycycline (VIBRAMYCIN) 100 MG capsule Take 1 capsule (100 mg total) by mouth 2 (two) times daily. 01/19/18   Teressa Lower, NP  ibuprofen (ADVIL,MOTRIN) 800 MG tablet Take 1 tablet (800 mg total) by mouth 3 (three) times daily. 12/24/17   Wieters, Junius Creamer, PA-C    Family History Family History  Problem  Relation Age of Onset  . Diabetes Maternal Aunt   . Cancer Maternal Grandmother   . Diabetes Maternal Grandmother     Social History Social History   Tobacco Use  . Smoking status: Current Every Day Smoker    Packs/day: 0.15    Types: Cigarettes  . Smokeless tobacco: Never Used  Substance Use Topics  . Alcohol use: Yes    Comment: occasionally  . Drug use: No    Comment: quit when found out pregnant      Allergies   Patient has no known allergies.   Review of Systems Review of Systems  Constitutional: Positive for fatigue and fever.  HENT: Positive for congestion, rhinorrhea and sore throat.   Respiratory: Positive for cough. Negative for wheezing.   Musculoskeletal: Positive for myalgias.  Neurological: Negative for headaches.  All other systems reviewed and are negative.    Physical Exam Updated Vital Signs BP 113/72 (BP Location: Right Arm)   Pulse 83   Temp 98.1 F (36.7 C) (Oral)   Resp 14   Ht 5\' 2"  (1.575 m)   Wt 49.9 kg   SpO2 99%   BMI 20.12 kg/m   Physical Exam Vitals signs and nursing note reviewed.  Constitutional:  General: She is not in acute distress.    Appearance: She is well-developed and normal weight.  HENT:     Head: Normocephalic and atraumatic.     Right Ear: Tympanic membrane normal.     Left Ear: Tympanic membrane normal.     Nose: Congestion and rhinorrhea present.     Mouth/Throat:     Mouth: Mucous membranes are moist.     Pharynx: Posterior oropharyngeal erythema present. No oropharyngeal exudate.  Eyes:     Pupils: Pupils are equal, round, and reactive to light.  Cardiovascular:     Rate and Rhythm: Normal rate and regular rhythm.     Heart sounds: Normal heart sounds. No murmur. No friction rub.  Pulmonary:     Effort: Pulmonary effort is normal.     Breath sounds: Normal breath sounds. No wheezing or rales.  Abdominal:     General: Bowel sounds are normal. There is no distension.     Palpations: Abdomen is  soft.     Tenderness: There is no abdominal tenderness. There is no guarding or rebound.  Musculoskeletal: Normal range of motion.        General: No tenderness.     Comments: No edema  Lymphadenopathy:     Cervical: No cervical adenopathy.  Skin:    General: Skin is warm and dry.     Findings: No rash.  Neurological:     Mental Status: She is alert and oriented to person, place, and time.     Cranial Nerves: No cranial nerve deficit.  Psychiatric:        Behavior: Behavior normal.      ED Treatments / Results  Labs (all labs ordered are listed, but only abnormal results are displayed) Labs Reviewed - No data to display  EKG None  Radiology No results found.  Procedures Procedures (including critical care time)  Medications Ordered in ED Medications - No data to display   Initial Impression / Assessment and Plan / ED Course  I have reviewed the triage vital signs and the nursing notes.  Pertinent labs & imaging results that were available during my care of the patient were reviewed by me and considered in my medical decision making (see chart for details).        Pt with symptoms consistent with viral URI/influenza like illness.  Significant other tested positive for flu yesterday.  Well appearing here.  No signs of breathing difficulty  No signs of pharyngitis, otitis or abnormal abdominal findings.   Patient has no known medical problems and is otherwise in no acute distress.  She will continue liquids, NSAIDs and acetaminophen for fever and rest  Final Clinical Impressions(s) / ED Diagnoses   Final diagnoses:  Influenza-like illness    ED Discharge Orders    None       Gwyneth Sprout, MD 10/01/18 1147

## 2018-10-01 NOTE — ED Triage Notes (Signed)
Pt reports that person in household has flu and she reports that she has been having headache, cough, body aches for 2 days. Pt denies fevers.

## 2019-03-20 ENCOUNTER — Encounter (HOSPITAL_COMMUNITY): Payer: Self-pay

## 2019-03-20 ENCOUNTER — Other Ambulatory Visit: Payer: Self-pay

## 2019-03-20 ENCOUNTER — Ambulatory Visit (HOSPITAL_COMMUNITY)
Admission: EM | Admit: 2019-03-20 | Discharge: 2019-03-20 | Disposition: A | Discharge status: Home / Self Care | Payer: Medicaid Other | Attending: Family Medicine | Admitting: Family Medicine

## 2019-03-20 DIAGNOSIS — M545 Low back pain, unspecified: Secondary | ICD-10-CM

## 2019-03-20 DIAGNOSIS — R112 Nausea with vomiting, unspecified: Secondary | ICD-10-CM | POA: Insufficient documentation

## 2019-03-20 DIAGNOSIS — R202 Paresthesia of skin: Secondary | ICD-10-CM | POA: Insufficient documentation

## 2019-03-20 LAB — POCT URINALYSIS DIP (DEVICE)
Glucose, UA: NEGATIVE mg/dL
Hgb urine dipstick: NEGATIVE
Ketones, ur: NEGATIVE mg/dL
Nitrite: POSITIVE — AB
Protein, ur: NEGATIVE mg/dL
Specific Gravity, Urine: 1.025 (ref 1.005–1.030)
Urobilinogen, UA: 2 mg/dL — ABNORMAL HIGH (ref 0.0–1.0)
pH: 5.5 (ref 5.0–8.0)

## 2019-03-20 MED ORDER — CIPROFLOXACIN HCL 500 MG PO TABS: 500.0000 mg | ORAL_TABLET | Freq: Two times a day (BID) | ORAL | 0 refills | Status: AC

## 2019-03-20 MED ORDER — CYCLOBENZAPRINE HCL 5 MG PO TABS: 5.0000 mg | ORAL_TABLET | Freq: Two times a day (BID) | ORAL | 0 refills | Status: DC | PRN

## 2019-03-20 MED ORDER — ONDANSETRON 4 MG PO TBDP
4.0000 mg | ORAL_TABLET | Freq: Once | ORAL | Status: AC
  Administered 2019-03-20: 4 mg via ORAL

## 2019-03-20 MED ORDER — ONDANSETRON 4 MG PO TBDP: 4.0000 mg | ORAL_TABLET | Freq: Three times a day (TID) | ORAL | 0 refills | Status: DC | PRN

## 2019-03-20 MED ORDER — PREDNISONE 50 MG PO TABS: 50.0000 mg | ORAL_TABLET | Freq: Every day | ORAL | 0 refills | Status: AC

## 2019-03-20 MED ORDER — ONDANSETRON 4 MG PO TBDP
ORAL_TABLET | ORAL | Status: AC
  Filled 2019-03-20: qty 1

## 2019-03-20 NOTE — ED Triage Notes (Signed)
Pt present numbness in the lower part of her back that radiates down toward her feet. Symptoms of the numbness started two weeks ago. Pt also present some vomiting that started yesterday.pt is unable to keep anything down

## 2019-03-20 NOTE — Discharge Instructions (Signed)
Your urine was suggestive of a UTI.  Please begin taking Cipro twice daily for the next week.  Please take with food. I will send the urine for culture to confirm this as well as ensure the antibiotic we are giving you works on it  Please use Zofran as needed for nausea and vomiting, push fluids, slowly transition diet  Please begin prednisone daily to help with back pain/numbness You may use flexeril as needed to help with pain. This is a muscle relaxer and causes sedation- please use only at bedtime or when you will be home and not have to drive/work   Please follow-up in emergency room if developing numbness to groin, issues controlling urination or bowel movements, weakness in legs

## 2019-03-20 NOTE — ED Provider Notes (Signed)
[facility]    CSN: [id number] Arrival date & time: 03/20/19  1009     History   Chief Complaint Chief Complaint  Patient presents with   Numbness   Emesis    HPI Marcia Jackson is a 28 y.o. female no significant past medical history presenting today for evaluation of lower back pain, numbness as well as nausea and vomiting.  Patient states that over the past 2 weeks she has had lower back pain that worsens with movement.  Denies any injury or increase in activity.  She has had associated numbness that wraps around from her back to her lower abdomen as well as within her legs.  Denies numbness in groin region/saddle anesthesia.  Denies bowel or bladder dysfunction.  Over the past 24 hours she is developed nausea and vomiting and has not been able to keep down oral intake including fluids.  She denies any fevers chills or body aches.  Denies night sweats, night pain.  Denies cold symptoms of cough, congestion or sore throat.  She has not used any medicines for symptoms.  Last menstrual cycle was a few weeks ago around 7/16.  Has Nexplanon.  Denies dysuria, increased frequency, urgency or hematuria.  Denies pelvic pain or abnormal discharge.  HPI  Past Medical History:  Diagnosis Date   Depression     Patient Active Problem List   Diagnosis Date Noted   Encounter for other contraceptive management 02/03/2017   Rh negative, antepartum 05/17/2016   Rubella non-immune status, antepartum 05/17/2016   Family history of congenital heart defect 05/07/2016    Past Surgical History:  Procedure Laterality Date   NO PAST SURGERIES      OB History    Gravida  2   Para  2   Term  2   Preterm      AB      Living  2     SAB      TAB      Ectopic      Multiple  0   Live Births  2            Home Medications    Prior to Admission medications   Medication Sig Start Date End Date Taking? Authorizing Provider  ciprofloxacin (CIPRO) 500 MG tablet  Take 1 tablet (500 mg total) by mouth every 12 (twelve) hours for 7 days. 03/20/19 03/27/19  Agnieszka Newhouse C, PA-C  cyclobenzaprine (FLEXERIL) 5 MG tablet Take 1-2 tablets (5-10 mg total) by mouth 2 (two) times daily as needed for muscle spasms. 03/20/19   Georgiann Neider C, PA-C  doxycycline (VIBRAMYCIN) 100 MG capsule Take 1 capsule (100 mg total) by mouth 2 (two) times daily. 01/19/18   Teressa LowerPickering, Vrinda, NP  ibuprofen (ADVIL,MOTRIN) 800 MG tablet Take 1 tablet (800 mg total) by mouth 3 (three) times daily. 12/24/17   Sonia Bromell C, PA-C  ondansetron (ZOFRAN-ODT) 4 MG disintegrating tablet Take 1 tablet (4 mg total) by mouth every 8 (eight) hours as needed for nausea or vomiting. 03/20/19   Lorielle Boehning C, PA-C  predniSONE (DELTASONE) 50 MG tablet Take 1 tablet (50 mg total) by mouth daily with breakfast for 5 days. 03/20/19 03/25/19  Davinder Haff, Junius CreamerHallie C, PA-C    Family History Family History  Problem Relation Age of Onset   Diabetes Maternal Aunt    Cancer Maternal Grandmother    Diabetes Maternal Grandmother     Social History Social History   Tobacco Use  Smoking status: Current Every Day Smoker    Packs/day: 0.15    Types: Cigarettes   Smokeless tobacco: Never Used  Substance Use Topics   Alcohol use: Yes    Comment: occasionally   Drug use: No    Comment: quit when found out pregnant      Allergies   Patient has no known allergies.   Review of Systems Review of Systems  Constitutional: Negative for activity change, appetite change and fever.  Respiratory: Negative for shortness of breath.   Cardiovascular: Negative for chest pain.  Gastrointestinal: Positive for nausea and vomiting. Negative for abdominal pain and diarrhea.  Genitourinary: Negative for dysuria, flank pain, genital sores, hematuria, menstrual problem, vaginal bleeding, vaginal discharge and vaginal pain.  Musculoskeletal: Positive for back pain.  Skin: Negative for rash.  Neurological: Positive  for numbness. Negative for dizziness, light-headedness and headaches.     Physical Exam Triage Vital Signs ED Triage Vitals [03/20/19 1027]  Enc Vitals Group     BP (!) 122/91     Pulse Rate (!) 103     Resp 16     Temp 98 F (36.7 C)     Temp Source Oral     SpO2 98 %     Weight      Height      Head Circumference      Peak Flow      Pain Score 5     Pain Loc      Pain Edu?      Excl. in [place]?    No data found.  Updated Vital Signs BP (!) 122/91 (BP Location: Left Arm)    Pulse (!) 103    Temp 98 F (36.7 C) (Oral)    Resp 16    SpO2 98%   Visual Acuity Right Eye Distance:   Left Eye Distance:   Bilateral Distance:    Right Eye Near:   Left Eye Near:    Bilateral Near:     Physical Exam Vitals signs and nursing note reviewed.  Constitutional:      General: She is not in acute distress.    Appearance: She is well-developed.  HENT:     Head: Normocephalic and atraumatic.  Eyes:     Conjunctiva/sclera: Conjunctivae normal.  Neck:     Musculoskeletal: Neck supple.  Cardiovascular:     Rate and Rhythm: Normal rate and regular rhythm.     Heart sounds: No murmur.  Pulmonary:     Effort: Pulmonary effort is normal. No respiratory distress.     Breath sounds: Normal breath sounds.  Abdominal:     Palpations: Abdomen is soft.     Tenderness: There is no abdominal tenderness.     Comments: Abdomen soft, nondistended, nontender to light and deep palpation throughout abdomen, and patient does report numbness throughout bilateral lower quadrants with palpation, but denies pain.  Musculoskeletal:     Comments: Nontender palpation of cervical and thoracic spine midline, diffuse tenderness throughout lumbar spine as well as bilateral lumbar musculature.  No focal tenderness, no step-off or deformity.  Strength 5/5 and equal bilaterally at shoulders, grip strength, radial pulse 2+, sensation intact distally  Strength 5/5 and equal bilaterally at hips and knees, patellar  reflex 2+ bilaterally  Gait without abnormality  Skin:    General: Skin is warm and dry.  Neurological:     General: No focal deficit present.     Mental Status: She is alert and oriented to person,  place, and time. Mental status is at baseline.      UC Treatments / Results  Labs (all labs ordered are listed, but only abnormal results are displayed) Labs Reviewed  POCT URINALYSIS DIP (DEVICE) - Abnormal; Notable for the following components:      Result Value   Bilirubin Urine SMALL (*)    Urobilinogen, UA 2.0 (*)    Nitrite POSITIVE (*)    Leukocytes,Ua TRACE (*)    All other components within normal limits  URINE CULTURE    EKG   Radiology No results found.  Procedures Procedures (including critical care time)  Medications Ordered in UC Medications  ondansetron (ZOFRAN-ODT) disintegrating tablet 4 mg (4 mg Oral Given 03/20/19 1056)  ondansetron (ZOFRAN-ODT) 4 MG disintegrating tablet (has no administration in time range)    Initial Impression / Assessment and Plan / UC Course  I have reviewed the triage vital signs and the nursing notes.  Pertinent labs & imaging results that were available during my care of the patient were reviewed by me and considered in my medical decision making (see chart for details).     UA with positive nitrites, trace leuks, will empirically treat for UTI/Pyelo given associated vomiting and back pain.  Urine culture sent to confirm.  Will provide Cipro and Zofran to use at home.  Given patient's back pain and radiation into legs, initially discussed possible MSK etiology/sciatica.  Provided prednisone and Flexeril to use for this as well.  Advised to monitor for numbness to resolve with treatment of UTI.  Discussed with patient concern around numbness, on exam strength and reflexes intact, has control of urination and bowel movements, do not suspect cauda equina.  Advised if she develops any weakness or symptoms progressed to follow-up in  emergency room.Discussed strict return precautions. Patient verbalized understanding and is agreeable with plan.  Final Clinical Impressions(s) / UC Diagnoses   Final diagnoses:  Non-intractable vomiting with nausea, unspecified vomiting type  Acute bilateral low back pain without sciatica  Paresthesia     Discharge Instructions     Your urine was suggestive of a UTI.  Please begin taking Cipro twice daily for the next week.  Please take with food. I will send the urine for culture to confirm this as well as ensure the antibiotic we are giving you works on it  Please use Zofran as needed for nausea and vomiting, push fluids, slowly transition diet  Please begin prednisone daily to help with back pain/numbness You may use flexeril as needed to help with pain. This is a muscle relaxer and causes sedation- please use only at bedtime or when you will be home and not have to drive/work   Please follow-up in emergency room if developing numbness to groin, issues controlling urination or bowel movements, weakness in legs    ED Prescriptions    Medication Sig Dispense Auth. Provider   ondansetron (ZOFRAN-ODT) 4 MG disintegrating tablet Take 1 tablet (4 mg total) by mouth every 8 (eight) hours as needed for nausea or vomiting. 20 tablet Iden Stripling C, PA-C   cyclobenzaprine (FLEXERIL) 5 MG tablet Take 1-2 tablets (5-10 mg total) by mouth 2 (two) times daily as needed for muscle spasms. 24 tablet Ladavia Lindenbaum C, PA-C   ciprofloxacin (CIPRO) 500 MG tablet Take 1 tablet (500 mg total) by mouth every 12 (twelve) hours for 7 days. 14 tablet Nare Gaspari C, PA-C   predniSONE (DELTASONE) 50 MG tablet Take 1 tablet (50 mg total) by mouth  daily with breakfast for 5 days. 5 tablet Kaydon Husby C, PA-C     Controlled Substance Prescriptions [place] Controlled Substance Registry consulted? Not Applicable   Lew DawesWieters, Dallan Schonberg C, [place] JerseyPA-C 03/21/19 [phone]

## 2019-03-21 ENCOUNTER — Other Ambulatory Visit: Payer: Self-pay

## 2019-03-21 ENCOUNTER — Encounter (HOSPITAL_COMMUNITY): Payer: Self-pay

## 2019-03-21 DIAGNOSIS — N200 Calculus of kidney: Secondary | ICD-10-CM | POA: Insufficient documentation

## 2019-03-21 DIAGNOSIS — F17210 Nicotine dependence, cigarettes, uncomplicated: Secondary | ICD-10-CM | POA: Insufficient documentation

## 2019-03-21 DIAGNOSIS — E860 Dehydration: Secondary | ICD-10-CM | POA: Insufficient documentation

## 2019-03-21 NOTE — ED Triage Notes (Signed)
Pt reports vomiting over the last 4 days. She states that she was seen at [facility] yesterday and prescribed antibiotics for a UTI. She states that she was still vomiting today though. Denies nausea at this time. Started her antibiotics this morning.

## 2019-03-22 ENCOUNTER — Emergency Department (HOSPITAL_COMMUNITY)
Admission: EM | Admit: 2019-03-22 | Discharge: 2019-03-22 | Disposition: A | Discharge status: Home / Self Care | Payer: Medicaid Other | Attending: Emergency Medicine | Admitting: Emergency Medicine

## 2019-03-22 DIAGNOSIS — N12 Tubulo-interstitial nephritis, not specified as acute or chronic: Secondary | ICD-10-CM

## 2019-03-22 DIAGNOSIS — R7989 Other specified abnormal findings of blood chemistry: Secondary | ICD-10-CM

## 2019-03-22 DIAGNOSIS — E860 Dehydration: Secondary | ICD-10-CM

## 2019-03-22 LAB — URINALYSIS, ROUTINE W REFLEX MICROSCOPIC
Glucose, UA: NEGATIVE mg/dL
Hgb urine dipstick: NEGATIVE
Ketones, ur: 80 mg/dL — AB
Nitrite: NEGATIVE
Protein, ur: 30 mg/dL — AB
Specific Gravity, Urine: 1.026 (ref 1.005–1.030)
pH: 6 (ref 5.0–8.0)

## 2019-03-22 LAB — COMPREHENSIVE METABOLIC PANEL
ALT: 45 U/L — ABNORMAL HIGH (ref 0–44)
AST: 55 U/L — ABNORMAL HIGH (ref 15–41)
Albumin: 4.1 g/dL (ref 3.5–5.0)
Alkaline Phosphatase: 167 U/L — ABNORMAL HIGH (ref 38–126)
Anion gap: 15 (ref 5–15)
BUN: 7 mg/dL (ref 6–20)
CO2: 20 mmol/L — ABNORMAL LOW (ref 22–32)
Calcium: 9.3 mg/dL (ref 8.9–10.3)
Chloride: 105 mmol/L (ref 98–111)
Creatinine, Ser: 0.64 mg/dL (ref 0.44–1.00)
GFR calc Af Amer: >60 mL/min (ref >60)
GFR calc non Af Amer: >60 mL/min (ref >60)
Glucose, Bld: 106 mg/dL — ABNORMAL HIGH (ref 70–99)
Potassium: 3.3 mmol/L — ABNORMAL LOW (ref 3.5–5.1)
Sodium: 140 mmol/L (ref 135–145)
Total Bilirubin: 2 mg/dL — ABNORMAL HIGH (ref 0.3–1.2)
Total Protein: 7 g/dL (ref 6.5–8.1)

## 2019-03-22 LAB — CBC WITH DIFFERENTIAL/PLATELET
Abs Immature Granulocytes: 0.03 10*3/uL (ref 0.00–0.07)
Basophils Absolute: 0.1 10*3/uL (ref 0.0–0.1)
Basophils Relative: 1 %
Eosinophils Absolute: 0 10*3/uL (ref 0.0–0.5)
Eosinophils Relative: 0 %
HCT: 45.8 % (ref 36.0–46.0)
Hemoglobin: 16 g/dL — ABNORMAL HIGH (ref 12.0–15.0)
Immature Granulocytes: 0 %
Lymphocytes Relative: 13 %
Lymphs Abs: 1.7 10*3/uL (ref 0.7–4.0)
MCH: 34.8 pg — ABNORMAL HIGH (ref 26.0–34.0)
MCHC: 34.9 g/dL (ref 30.0–36.0)
MCV: 99.6 fL (ref 80.0–100.0)
Monocytes Absolute: 1.1 10*3/uL — ABNORMAL HIGH (ref 0.1–1.0)
Monocytes Relative: 8 %
Neutro Abs: 10.3 10*3/uL — ABNORMAL HIGH (ref 1.7–7.7)
Neutrophils Relative %: 78 %
Platelets: 254 10*3/uL (ref 150–400)
RBC: 4.6 MIL/uL (ref 3.87–5.11)
RDW: 14 % (ref 11.5–15.5)
WBC: 13.2 10*3/uL — ABNORMAL HIGH (ref 4.0–10.5)
nRBC: 0 % (ref 0.0–0.2)

## 2019-03-22 LAB — URINE CULTURE: Culture: >=100000 — AB

## 2019-03-22 LAB — PREGNANCY, URINE: Preg Test, Ur: NEGATIVE

## 2019-03-22 MED ORDER — PROMETHAZINE HCL 25 MG PO TABS
25.0000 mg | ORAL_TABLET | Freq: Once | ORAL | Status: AC
  Administered 2019-03-22: 25 mg via ORAL
  Filled 2019-03-22: qty 1

## 2019-03-22 MED ORDER — PROMETHAZINE HCL 25 MG RE SUPP: 25.0000 mg | Freq: Four times a day (QID) | RECTAL | 0 refills | Status: DC | PRN

## 2019-03-22 MED ORDER — SODIUM CHLORIDE 0.9 % IV BOLUS
1000.0000 mL | Freq: Once | INTRAVENOUS | Status: AC
  Administered 2019-03-22: 1000 mL via INTRAVENOUS

## 2019-03-22 MED ORDER — PROMETHAZINE HCL 25 MG PO TABS: 25.0000 mg | ORAL_TABLET | Freq: Four times a day (QID) | ORAL | 0 refills | Status: DC | PRN

## 2019-03-22 MED ORDER — CIPROFLOXACIN HCL 500 MG PO TABS
500.0000 mg | ORAL_TABLET | Freq: Once | ORAL | Status: AC
  Administered 2019-03-22: 500 mg via ORAL
  Filled 2019-03-22: qty 1

## 2019-03-22 MED ORDER — ONDANSETRON HCL 4 MG/2ML IJ SOLN
4.0000 mg | Freq: Once | INTRAMUSCULAR | Status: AC
  Administered 2019-03-22: 4 mg via INTRAVENOUS
  Filled 2019-03-22: qty 2

## 2019-03-22 NOTE — ED Provider Notes (Signed)
[facility] DEPT Provider Note   CSN: [id number] Arrival date & time: 03/21/19  2223    History   Chief Complaint Chief Complaint  Patient presents with  . Emesis    HPI Marcia Jackson is a 28 y.o. female.     HPI  28 year old comes in a chief complaint of vomiting. Patient was recently diagnosed with pyelonephritis.  She states that the diagnosis was made on either Friday or Saturday, but she started having worsening symptoms on Sunday.  She threw up more than 5 times yesterday and has no appetite.  Whenever she eats or drink, she throws up.  She was unable to keep her medications down.  She started having dizziness, lightheadedness therefore she decided to come to the ER.  Patient admits to regular alcohol use.  She has no history of abdominal surgeries.  She is quite sure she is not pregnant denies any vaginal discharge, bleeding or risk for STDs.  Past Medical History:  Diagnosis Date  . Depression     Patient Active Problem List   Diagnosis Date Noted  . Encounter for other contraceptive management 02/03/2017  . Rh negative, antepartum 05/17/2016  . Rubella non-immune status, antepartum 05/17/2016  . Family history of congenital heart defect 05/07/2016    Past Surgical History:  Procedure Laterality Date  . NO PAST SURGERIES       OB History    Gravida  2   Para  2   Term  2   Preterm      AB      Living  2     SAB      TAB      Ectopic      Multiple  0   Live Births  2            Home Medications    Prior to Admission medications   Medication Sig Start Date End Date Taking? Authorizing Provider  ciprofloxacin (CIPRO) 500 MG tablet Take 1 tablet (500 mg total) by mouth every 12 (twelve) hours for 7 days. 03/20/19 03/27/19 Yes Wieters, Hallie C, PA-C  hydroxypropyl methylcellulose / hypromellose (ISOPTO TEARS / GONIOVISC) 2.5 % ophthalmic solution Place 1 drop into both eyes 3 (three) times daily as needed  for dry eyes.   Yes [provider]  ondansetron (ZOFRAN-ODT) 4 MG disintegrating tablet Take 1 tablet (4 mg total) by mouth every 8 (eight) hours as needed for nausea or vomiting. 03/20/19  Yes Wieters, Hallie C, PA-C  cyclobenzaprine (FLEXERIL) 5 MG tablet Take 1-2 tablets (5-10 mg total) by mouth 2 (two) times daily as needed for muscle spasms. 03/20/19   Wieters, Hallie C, PA-C  doxycycline (VIBRAMYCIN) 100 MG capsule Take 1 capsule (100 mg total) by mouth 2 (two) times daily. Patient not taking: Reported on 03/22/2019 01/19/18   Glendell Docker, NP  ibuprofen (ADVIL,MOTRIN) 800 MG tablet Take 1 tablet (800 mg total) by mouth 3 (three) times daily. Patient not taking: Reported on 03/22/2019 12/24/17   Wieters, Hallie C, PA-C  predniSONE (DELTASONE) 50 MG tablet Take 1 tablet (50 mg total) by mouth daily with breakfast for 5 days. 03/20/19 03/25/19  Wieters, Hallie C, PA-C  promethazine (PHENERGAN) 25 MG suppository Place 1 suppository (25 mg total) rectally every 6 (six) hours as needed for nausea. 03/22/19   Varney Biles, MD  promethazine (PHENERGAN) 25 MG tablet Take 1 tablet (25 mg total) by mouth every 6 (six) hours as needed for nausea or  vomiting. 03/22/19   Varney Biles, MD    Family History Family History  Problem Relation Age of Onset  . Diabetes Maternal Aunt   . Cancer Maternal Grandmother   . Diabetes Maternal Grandmother     Social History Social History   Tobacco Use  . Smoking status: Current Every Day Smoker    Packs/day: 0.15    Types: Cigarettes  . Smokeless tobacco: Never Used  Substance Use Topics  . Alcohol use: Yes    Comment: occasionally  . Drug use: No    Comment: quit when found out pregnant      Allergies   Patient has no known allergies.   Review of Systems Review of Systems  Constitutional: Positive for activity change.  Respiratory: Negative for shortness of breath.   Cardiovascular: Negative for chest pain.  Gastrointestinal:  Positive for nausea and vomiting. Negative for abdominal pain.  Allergic/Immunologic: Negative for immunocompromised state.  All other systems reviewed and are negative.    Physical Exam Updated Vital Signs BP (!) 126/93   Pulse 88   Temp 98.5 F (36.9 C) (Oral)   Resp 16   SpO2 96%   Physical Exam Vitals signs and nursing note reviewed.  Constitutional:      Appearance: She is well-developed.  HENT:     Head: Normocephalic and atraumatic.  Eyes:     Pupils: Pupils are equal, round, and reactive to light.  Neck:     Musculoskeletal: Neck supple.  Cardiovascular:     Rate and Rhythm: Normal rate and regular rhythm.     Heart sounds: Normal heart sounds.  Pulmonary:     Effort: Pulmonary effort is normal. No respiratory distress.  Abdominal:     General: There is no distension.     Palpations: Abdomen is soft.     Tenderness: There is no abdominal tenderness. There is no guarding or rebound.  Skin:    General: Skin is warm and dry.  Neurological:     Mental Status: She is alert and oriented to person, place, and time.      ED Treatments / Results  Labs (all labs ordered are listed, but only abnormal results are displayed) Labs Reviewed  COMPREHENSIVE METABOLIC PANEL - Abnormal; Notable for the following components:      Result Value   Potassium 3.3 (*)    CO2 20 (*)    Glucose, Bld 106 (*)    AST 55 (*)    ALT 45 (*)    Alkaline Phosphatase 167 (*)    Total Bilirubin 2.0 (*)    All other components within normal limits  CBC WITH DIFFERENTIAL/PLATELET - Abnormal; Notable for the following components:   WBC 13.2 (*)    Hemoglobin 16.0 (*)    MCH 34.8 (*)    Neutro Abs 10.3 (*)    Monocytes Absolute 1.1 (*)    All other components within normal limits  URINALYSIS, ROUTINE W REFLEX MICROSCOPIC - Abnormal; Notable for the following components:   Color, Urine AMBER (*)    APPearance HAZY (*)    Bilirubin Urine MODERATE (*)    Ketones, ur 80 (*)    Protein,  ur 30 (*)    Leukocytes,Ua TRACE (*)    Bacteria, UA RARE (*)    All other components within normal limits  URINE CULTURE  PREGNANCY, URINE    EKG None  Radiology No results found.  Procedures Procedures (including critical care time)  Medications Ordered in ED Medications  sodium chloride 0.9 % bolus 1,000 mL (0 mLs Intravenous Stopped 03/22/19 0552)  ondansetron (ZOFRAN) injection 4 mg (4 mg Intravenous Given 03/22/19 0438)  ciprofloxacin (CIPRO) tablet 500 mg (500 mg Oral Given 03/22/19 0630)  promethazine (PHENERGAN) tablet 25 mg (25 mg Oral Given 03/22/19 0629)  sodium chloride 0.9 % bolus 1,000 mL (1,000 mLs Intravenous New Bag/Given 03/22/19 0631)     Initial Impression / Assessment and Plan / ED Course  I have reviewed the triage vital signs and the nursing notes.  Pertinent labs & imaging results that were available during my care of the patient were reviewed by me and considered in my medical decision making (see chart for details).  Clinical Course as of Mar 21 704  Mon Mar 22, 2019  0703 LFTs and alk phos are slightly elevated.  Patient does not have any epigastric /right upper quadrant tenderness.  She has slight elevation of bili as well.  I think patient does not have hepatobiliary issue going on, her LFTs are elevated possibly because of her heavy alcohol use or stress on her body from her severe vomiting.  Comprehensive metabolic panel(!) [AN]  2449 IV fluid ordered. Pt reassessed. Pt's VSS and WNL. Pt's cap refill < 3 seconds. Pt has been hydrated in the ER and now passed po challenge. We will discharge with antiemetic. Strict ER return precautions have been discussed and pt will return if he is unable to tolerate fluids and symptoms are getting worse.  Ketones, ur(!): 80 [AN]    Clinical Course User Index [AN] Varney Biles, MD       28 year old comes in a chief complaint of nausea and vomiting. She has no significant medical history and is being  treated for pyelonephritis.  Her exam is overall reassuring.  She does appear dry.  We will start hydrating her.  Basic labs have been ordered to ensure there is no AKI.  She does not appear septic right now.  She has no pelvic pain, lower quadrant abdominal pain and has refused pelvic exam.  She is having back pain, which we are attributing to her pyelonephritis/UTI.  Final Clinical Impressions(s) / ED Diagnoses   Final diagnoses:  Dehydration  Pyelonephritis  Elevated LFTs    ED Discharge Orders         Ordered    promethazine (PHENERGAN) 25 MG suppository  Every 6 hours PRN     03/22/19 0656    promethazine (PHENERGAN) 25 MG tablet  Every 6 hours PRN     03/22/19 0656           Varney Biles, MD 03/22/19 7530

## 2019-03-22 NOTE — Discharge Instructions (Signed)
We saw the ER for nausea and vomiting. As discussed, you have severe dehydration and slight elevation of liver enzymes. Fortunately we were able to control your symptoms and able to hydrate you in the ER.  Please take the nausea medication.  Please return to the ER if your symptoms worsen; you have increased pain, fevers, chills, inability to keep any medications down, confusion. Otherwise see the outpatient doctor as requested.

## 2019-03-22 NOTE — ED Notes (Signed)
Pt aware urine sample is needed 

## 2019-03-22 NOTE — ED Notes (Signed)
Called lab about delay in urine sample. Sample being run now.

## 2019-03-23 LAB — URINE CULTURE: Culture: NO GROWTH

## 2019-04-22 ENCOUNTER — Emergency Department (HOSPITAL_COMMUNITY)
Admission: EM | Admit: 2019-04-22 | Discharge: 2019-04-22 | Disposition: A | Discharge status: Home / Self Care | Payer: Medicaid Other | Attending: Emergency Medicine | Admitting: Emergency Medicine

## 2019-04-22 ENCOUNTER — Other Ambulatory Visit: Payer: Self-pay

## 2019-04-22 DIAGNOSIS — G609 Hereditary and idiopathic neuropathy, unspecified: Secondary | ICD-10-CM | POA: Insufficient documentation

## 2019-04-22 DIAGNOSIS — E876 Hypokalemia: Secondary | ICD-10-CM | POA: Insufficient documentation

## 2019-04-22 DIAGNOSIS — F17210 Nicotine dependence, cigarettes, uncomplicated: Secondary | ICD-10-CM | POA: Insufficient documentation

## 2019-04-22 DIAGNOSIS — M79672 Pain in left foot: Secondary | ICD-10-CM | POA: Insufficient documentation

## 2019-04-22 LAB — COMPREHENSIVE METABOLIC PANEL
ALT: 29 U/L (ref 0–44)
AST: 36 U/L (ref 15–41)
Albumin: 4.2 g/dL (ref 3.5–5.0)
Alkaline Phosphatase: 139 U/L — ABNORMAL HIGH (ref 38–126)
Anion gap: 12 (ref 5–15)
BUN: <5 mg/dL — ABNORMAL LOW (ref 6–20)
CO2: 23 mmol/L (ref 22–32)
Calcium: 8.9 mg/dL (ref 8.9–10.3)
Chloride: 104 mmol/L (ref 98–111)
Creatinine, Ser: 0.59 mg/dL (ref 0.44–1.00)
GFR calc Af Amer: >60 mL/min (ref >60)
GFR calc non Af Amer: >60 mL/min (ref >60)
Glucose, Bld: 111 mg/dL — ABNORMAL HIGH (ref 70–99)
Potassium: 3 mmol/L — ABNORMAL LOW (ref 3.5–5.1)
Sodium: 139 mmol/L (ref 135–145)
Total Bilirubin: 1.6 mg/dL — ABNORMAL HIGH (ref 0.3–1.2)
Total Protein: 6.8 g/dL (ref 6.5–8.1)

## 2019-04-22 LAB — CBC WITH DIFFERENTIAL/PLATELET
Abs Immature Granulocytes: 0.02 10*3/uL (ref 0.00–0.07)
Basophils Absolute: 0.1 10*3/uL (ref 0.0–0.1)
Basophils Relative: 1 %
Eosinophils Absolute: 0 10*3/uL (ref 0.0–0.5)
Eosinophils Relative: 1 %
HCT: 42.5 % (ref 36.0–46.0)
Hemoglobin: 15 g/dL (ref 12.0–15.0)
Immature Granulocytes: 0 %
Lymphocytes Relative: 20 %
Lymphs Abs: 1.4 10*3/uL (ref 0.7–4.0)
MCH: 33.4 pg (ref 26.0–34.0)
MCHC: 35.3 g/dL (ref 30.0–36.0)
MCV: 94.7 fL (ref 80.0–100.0)
Monocytes Absolute: 0.5 10*3/uL (ref 0.1–1.0)
Monocytes Relative: 8 %
Neutro Abs: 4.7 10*3/uL (ref 1.7–7.7)
Neutrophils Relative %: 70 %
Platelets: 232 10*3/uL (ref 150–400)
RBC: 4.49 MIL/uL (ref 3.87–5.11)
RDW: 13.1 % (ref 11.5–15.5)
WBC: 6.7 10*3/uL (ref 4.0–10.5)
nRBC: 0 % (ref 0.0–0.2)

## 2019-04-22 LAB — I-STAT BETA HCG BLOOD, ED (MC, WL, AP ONLY): I-stat hCG, quantitative: <5 m[IU]/mL (ref <5)

## 2019-04-22 MED ORDER — POTASSIUM CHLORIDE CRYS ER 20 MEQ PO TBCR: 20.0000 meq | EXTENDED_RELEASE_TABLET | Freq: Two times a day (BID) | ORAL | 0 refills | Status: DC

## 2019-04-22 MED ORDER — GABAPENTIN 100 MG PO CAPS: 100.0000 mg | ORAL_CAPSULE | Freq: Three times a day (TID) | ORAL | 0 refills | Status: DC

## 2019-04-22 MED ORDER — POTASSIUM CHLORIDE CRYS ER 20 MEQ PO TBCR
40.0000 meq | EXTENDED_RELEASE_TABLET | Freq: Once | ORAL | Status: AC
  Administered 2019-04-22: 40 meq via ORAL
  Filled 2019-04-22: qty 2

## 2019-04-22 NOTE — ED Notes (Signed)
Pt given dc instructions pt verbalizes understanding.  

## 2019-04-22 NOTE — ED Triage Notes (Signed)
Patient reports bilateral foot tingling and pain that is worse with walking, states she was seen for the numbness in her feet 1 month ago told it was r/t to pinched nerve as she has back problems, but states pain in feet is new.

## 2019-04-22 NOTE — ED Provider Notes (Signed)
[facility] Provider Note   CSN: [id number] Arrival date & time: 04/22/19  0803     History   Chief Complaint Chief Complaint  Patient presents with  . Foot Pain    HPI Marcia LanJessica L Jackson is a [age] y.o. female.     HPI  28 year old female presents with bilateral feet pain.  She states this is been ongoing for about 3 weeks.  About a month ago or so she has had numbness in her bilateral lower legs.  She states she was told it was coming from her back.  This is about the same.  The feet pain is new.  No injuries.  Feels like they are swollen but they do not appear swollen.  No skin color changes.  No fevers.  No illicit drug use.  She recently cut back on alcohol from drinking every day to drinking more occasionally and on weekends as about 1 month ago she was told that her liver enzymes were abnormal.  No abdominal pain.  Tried some Aleve which partially helped with the pain in her feet but only for about 20 minutes so she stopped. No weakness. Occasionally has back pain in certain positions, but no consistent or severe back pain.  Past Medical History:  Diagnosis Date  . Depression     Patient Active Problem List   Diagnosis Date Noted  . Encounter for other contraceptive management 02/03/2017  . Rh negative, antepartum 05/17/2016  . Rubella non-immune status, antepartum 05/17/2016  . Family history of congenital heart defect 05/07/2016    Past Surgical History:  Procedure Laterality Date  . NO PAST SURGERIES       OB History    Gravida  2   Para  2   Term  2   Preterm      AB      Living  2     SAB      TAB      Ectopic      Multiple  0   Live Births  2            Home Medications    Prior to Admission medications   Medication Sig Start Date End Date Taking? Authorizing Provider  cyclobenzaprine (FLEXERIL) 5 MG tablet Take 1-2 tablets (5-10 mg total) by mouth 2 (two) times daily as needed for muscle spasms.  03/20/19   Wieters, Hallie C, PA-C  doxycycline (VIBRAMYCIN) 100 MG capsule Take 1 capsule (100 mg total) by mouth 2 (two) times daily. Patient not taking: Reported on 03/22/2019 01/19/18   Teressa LowerPickering, Vrinda, NP  gabapentin (NEURONTIN) 100 MG capsule Take 1 capsule (100 mg total) by mouth 3 (three) times daily for 14 days. 04/22/19 05/06/19  Pricilla LovelessGoldston, Deitrich Steve, MD  hydroxypropyl methylcellulose / hypromellose (ISOPTO TEARS / GONIOVISC) 2.5 % ophthalmic solution Place 1 drop into both eyes 3 (three) times daily as needed for dry eyes.    [provider]  ibuprofen (ADVIL,MOTRIN) 800 MG tablet Take 1 tablet (800 mg total) by mouth 3 (three) times daily. Patient not taking: Reported on 03/22/2019 12/24/17   Wieters, Hallie C, PA-C  ondansetron (ZOFRAN-ODT) 4 MG disintegrating tablet Take 1 tablet (4 mg total) by mouth every 8 (eight) hours as needed for nausea or vomiting. 03/20/19   Wieters, Hallie C, PA-C  potassium chloride SA (K-DUR) 20 MEQ tablet Take 1 tablet (20 mEq total) by mouth 2 (two) times daily for 4 days. 04/22/19 04/26/19  Pricilla LovelessGoldston, Sheela Mcculley,  MD  promethazine (PHENERGAN) 25 MG suppository Place 1 suppository (25 mg total) rectally every 6 (six) hours as needed for nausea. 03/22/19   Varney Biles, MD  promethazine (PHENERGAN) 25 MG tablet Take 1 tablet (25 mg total) by mouth every 6 (six) hours as needed for nausea or vomiting. 03/22/19   Varney Biles, MD    Family History Family History  Problem Relation Age of Onset  . Diabetes Maternal Aunt   . Cancer Maternal Grandmother   . Diabetes Maternal Grandmother     Social History Social History   Tobacco Use  . Smoking status: Current Every Day Smoker    Packs/day: 0.15    Types: Cigarettes  . Smokeless tobacco: Never Used  Substance Use Topics  . Alcohol use: Yes    Comment: occasionally  . Drug use: No    Comment: quit when found out pregnant      Allergies   Patient has no known allergies.   Review of Systems Review  of Systems  Constitutional: Negative for fever.  Cardiovascular: Negative for leg swelling.  Gastrointestinal: Negative for abdominal pain.  Musculoskeletal: Positive for myalgias.  Neurological: Positive for numbness. Negative for weakness.  All other systems reviewed and are negative.    Physical Exam Updated Vital Signs BP (!) 156/115 (BP Location: Right Arm)   Pulse 87   Temp 98.4 F (36.9 C) (Oral)   Resp 16   SpO2 99%   Physical Exam Vitals signs and nursing note reviewed.  Constitutional:      Appearance: She is well-developed.  HENT:     Head: Normocephalic and atraumatic.     Right Ear: External ear normal.     Left Ear: External ear normal.     Nose: Nose normal.  Eyes:     General:        Right eye: No discharge.        Left eye: No discharge.  Cardiovascular:     Rate and Rhythm: Normal rate and regular rhythm.     Pulses:          Dorsalis pedis pulses are 2+ on the right side and 2+ on the left side.     Heart sounds: Normal heart sounds.  Pulmonary:     Effort: Pulmonary effort is normal.     Breath sounds: Normal breath sounds.  Abdominal:     Palpations: Abdomen is soft.     Tenderness: There is no abdominal tenderness.  Musculoskeletal:     Comments: Normal color and warmth to bilateral feet. Normal strength. Decreased subjective sensation diffusely throughout feet and lower legs bilaterally. No edema/swelling  Skin:    General: Skin is warm and dry.  Neurological:     Mental Status: She is alert.  Psychiatric:        Mood and Affect: Mood is not anxious.      ED Treatments / Results  Labs (all labs ordered are listed, but only abnormal results are displayed) Labs Reviewed  COMPREHENSIVE METABOLIC PANEL - Abnormal; Notable for the following components:      Result Value   Potassium 3.0 (*)    Glucose, Bld 111 (*)    BUN <5 (*)    Alkaline Phosphatase 139 (*)    Total Bilirubin 1.6 (*)    All other components within normal limits   CBC WITH DIFFERENTIAL/PLATELET  I-STAT BETA HCG BLOOD, ED ([facility], [facility], [facility] ONLY)    EKG None  Radiology No results found.  Procedures  Procedures (including critical care time)  Medications Ordered in ED Medications  potassium chloride SA (K-DUR) CR tablet 40 mEq (has no administration in time range)     Initial Impression / Assessment and Plan / ED Course  I have reviewed the triage vital signs and the nursing notes.  Pertinent labs & imaging results that were available during my care of the patient were reviewed by me and considered in my medical decision making (see chart for details).        Labs obtained and show hypokalemia to 3.0.  Liver function test slightly improved.  Her exam is pretty nonfocal.  Perhaps the potassium is playing a role though her heavier alcohol consumption could have also caused a neuropathy.  Either way, I think acute emergent neurologic condition such as stroke or spinal cord emergency is pretty unlikely.  Will prescribe Neurontin to see if this helps with the discomfort and refer to neurology.  Replace potassium.  Final Clinical Impressions(s) / ED Diagnoses   Final diagnoses:  Idiopathic peripheral neuropathy  Hypokalemia    ED Discharge Orders         Ordered    gabapentin (NEURONTIN) 100 MG capsule  3 times daily     04/22/19 1022    potassium chloride SA (K-DUR) 20 MEQ tablet  2 times daily     04/22/19 1022    Ambulatory referral to Neurology    Comments: An appointment is requested in approximately: 2 weeks   04/22/19 1022           Pricilla Loveless, MD 04/22/19 [phone]

## 2019-04-28 ENCOUNTER — Other Ambulatory Visit: Payer: Self-pay

## 2019-04-28 ENCOUNTER — Emergency Department (HOSPITAL_COMMUNITY)
Admission: EM | Admit: 2019-04-28 | Discharge: 2019-04-28 | Disposition: A | Discharge status: Home / Self Care | Payer: Medicaid Other | Attending: Emergency Medicine | Admitting: Emergency Medicine

## 2019-04-28 DIAGNOSIS — R1013 Epigastric pain: Secondary | ICD-10-CM

## 2019-04-28 DIAGNOSIS — R200 Anesthesia of skin: Secondary | ICD-10-CM | POA: Insufficient documentation

## 2019-04-28 DIAGNOSIS — G609 Hereditary and idiopathic neuropathy, unspecified: Secondary | ICD-10-CM | POA: Insufficient documentation

## 2019-04-28 DIAGNOSIS — N3000 Acute cystitis without hematuria: Secondary | ICD-10-CM | POA: Insufficient documentation

## 2019-04-28 DIAGNOSIS — E538 Deficiency of other specified B group vitamins: Secondary | ICD-10-CM

## 2019-04-28 DIAGNOSIS — F17210 Nicotine dependence, cigarettes, uncomplicated: Secondary | ICD-10-CM | POA: Insufficient documentation

## 2019-04-28 LAB — CBC
HCT: 43.6 % (ref 36.0–46.0)
Hemoglobin: 16 g/dL — ABNORMAL HIGH (ref 12.0–15.0)
MCH: 34.8 pg — ABNORMAL HIGH (ref 26.0–34.0)
MCHC: 36.7 g/dL — ABNORMAL HIGH (ref 30.0–36.0)
MCV: 94.8 fL (ref 80.0–100.0)
Platelets: 263 10*3/uL (ref 150–400)
RBC: 4.6 MIL/uL (ref 3.87–5.11)
RDW: 14.5 % (ref 11.5–15.5)
WBC: 10.4 10*3/uL (ref 4.0–10.5)
nRBC: 0 % (ref 0.0–0.2)

## 2019-04-28 LAB — URINALYSIS, ROUTINE W REFLEX MICROSCOPIC
Glucose, UA: NEGATIVE mg/dL
Ketones, ur: NEGATIVE mg/dL
Nitrite: POSITIVE — AB
Protein, ur: 30 mg/dL — AB
Specific Gravity, Urine: 1.021 (ref 1.005–1.030)
pH: 6 (ref 5.0–8.0)

## 2019-04-28 LAB — COMPREHENSIVE METABOLIC PANEL
ALT: 28 U/L (ref 0–44)
AST: 39 U/L (ref 15–41)
Albumin: 4.5 g/dL (ref 3.5–5.0)
Alkaline Phosphatase: 144 U/L — ABNORMAL HIGH (ref 38–126)
Anion gap: 18 — ABNORMAL HIGH (ref 5–15)
BUN: <5 mg/dL — ABNORMAL LOW (ref 6–20)
CO2: 19 mmol/L — ABNORMAL LOW (ref 22–32)
Calcium: 10.1 mg/dL (ref 8.9–10.3)
Chloride: 99 mmol/L (ref 98–111)
Creatinine, Ser: 0.69 mg/dL (ref 0.44–1.00)
GFR calc Af Amer: >60 mL/min (ref >60)
GFR calc non Af Amer: >60 mL/min (ref >60)
Glucose, Bld: 109 mg/dL — ABNORMAL HIGH (ref 70–99)
Potassium: 3.2 mmol/L — ABNORMAL LOW (ref 3.5–5.1)
Sodium: 136 mmol/L (ref 135–145)
Total Bilirubin: 1.6 mg/dL — ABNORMAL HIGH (ref 0.3–1.2)
Total Protein: 7.4 g/dL (ref 6.5–8.1)

## 2019-04-28 LAB — I-STAT BETA HCG BLOOD, ED (MC, WL, AP ONLY): I-stat hCG, quantitative: <5 m[IU]/mL (ref <5)

## 2019-04-28 LAB — VITAMIN B12: Vitamin B-12: 440 pg/mL (ref 180–914)

## 2019-04-28 LAB — FOLATE: Folate: 3.8 ng/mL — ABNORMAL LOW (ref >5.9)

## 2019-04-28 LAB — LIPASE, BLOOD: Lipase: 25 U/L (ref 11–51)

## 2019-04-28 MED ORDER — FOLIC ACID 1 MG PO TABS: 2.0000 mg | ORAL_TABLET | Freq: Every day | ORAL | 1 refills | Status: DC

## 2019-04-28 MED ORDER — CEPHALEXIN 500 MG PO CAPS: 500.0000 mg | ORAL_CAPSULE | Freq: Two times a day (BID) | ORAL | 0 refills | Status: DC

## 2019-04-28 MED ORDER — FAMOTIDINE 20 MG PO TABS: 20.0000 mg | ORAL_TABLET | Freq: Two times a day (BID) | ORAL | 0 refills | Status: DC

## 2019-04-28 MED ORDER — SODIUM CHLORIDE 0.9% FLUSH: 3.0000 mL | Freq: Once | INTRAVENOUS | Status: DC

## 2019-04-28 NOTE — ED Provider Notes (Signed)
[facility] EMERGENCY DEPARTMENT Provider Note   CSN: [id number] Arrival date & time: 04/28/19  1123     History   Chief Complaint Chief Complaint  Patient presents with  . Abdominal Pain    HPI Marcia Jackson is a 28 y.o. female.     Patient with history of alcohol use presents the emergency department with complaint of bilateral foot and finger numbness that is been ongoing for several weeks.  Patient was seen in the emergency department on 04/22/19.  She had labs performed.  She was started on low-dose of gabapentin and encouraged to follow-up with neurology.  Patient states that she has an appointment in October.  Patient presents the emergency department today because she was having trouble walking because of the numbness and tingling in her feet while at work.  She states that they would not let her work any longer because of the way she was walking.  This prompted emergency department evaluation today.  There have been no significant changes in her symptoms since last visit.  She denies any weakness in her arms, legs or falls.  The tingling is from the middle of the foot to the toes.  It is similar on both sides.  She does not have any acute back pain or neck pain.  No headaches or vision changes.  She denies any injuries.    In addition patient describes a upper abdominal tight sensation that occurs every day that can last for an hour or 2 to all day long.  It is not related to eating.  She does not describe this as a pain.  It does not radiate.  It is not associated with nausea, vomiting, or diarrhea.  No constipation or blood in the stool.  No urinary symptoms including hematuria, dysuria, increased frequency or urgency.  She has taken some Tums for this.  No other treatments.  She describes the pain as a "indigestion blockage".  She denies any distention.  Patient states that she currently drinks occasionally.  Up until a few weeks ago she was a heavy, every day  drinker.  She has been attempting to cut back.  Patient denies any IV drug use.  She denies unexplained fevers or weight loss.  She denies history of cancer.     Past Medical History:  Diagnosis Date  . Depression     Patient Active Problem List   Diagnosis Date Noted  . Encounter for other contraceptive management 02/03/2017  . Rh negative, antepartum 05/17/2016  . Rubella non-immune status, antepartum 05/17/2016  . Family history of congenital heart defect 05/07/2016    Past Surgical History:  Procedure Laterality Date  . NO PAST SURGERIES       OB History    Gravida  2   Para  2   Term  2   Preterm      AB      Living  2     SAB      TAB      Ectopic      Multiple  0   Live Births  2            Home Medications    Prior to Admission medications   Medication Sig Start Date End Date Taking? Authorizing Provider  cyclobenzaprine (FLEXERIL) 5 MG tablet Take 1-2 tablets (5-10 mg total) by mouth 2 (two) times daily as needed for muscle spasms. 03/20/19   Wieters, Hallie C, PA-C  doxycycline (VIBRAMYCIN) 100  MG capsule Take 1 capsule (100 mg total) by mouth 2 (two) times daily. Patient not taking: Reported on 03/22/2019 01/19/18   Teressa LowerPickering, Vrinda, NP  gabapentin (NEURONTIN) 100 MG capsule Take 1 capsule (100 mg total) by mouth 3 (three) times daily for 14 days. 04/22/19 05/06/19  Pricilla LovelessGoldston, Scott, MD  hydroxypropyl methylcellulose / hypromellose (ISOPTO TEARS / GONIOVISC) 2.5 % ophthalmic solution Place 1 drop into both eyes 3 (three) times daily as needed for dry eyes.    [provider]  ibuprofen (ADVIL,MOTRIN) 800 MG tablet Take 1 tablet (800 mg total) by mouth 3 (three) times daily. Patient not taking: Reported on 03/22/2019 12/24/17   Wieters, Hallie C, PA-C  ondansetron (ZOFRAN-ODT) 4 MG disintegrating tablet Take 1 tablet (4 mg total) by mouth every 8 (eight) hours as needed for nausea or vomiting. 03/20/19   Wieters, Hallie C, PA-C  potassium  chloride SA (K-DUR) 20 MEQ tablet Take 1 tablet (20 mEq total) by mouth 2 (two) times daily for 4 days. 04/22/19 04/26/19  Pricilla LovelessGoldston, Scott, MD  promethazine (PHENERGAN) 25 MG suppository Place 1 suppository (25 mg total) rectally every 6 (six) hours as needed for nausea. 03/22/19   Derwood KaplanNanavati, Ankit, MD  promethazine (PHENERGAN) 25 MG tablet Take 1 tablet (25 mg total) by mouth every 6 (six) hours as needed for nausea or vomiting. 03/22/19   Derwood KaplanNanavati, Ankit, MD    Family History Family History  Problem Relation Age of Onset  . Diabetes Maternal Aunt   . Cancer Maternal Grandmother   . Diabetes Maternal Grandmother     Social History Social History   Tobacco Use  . Smoking status: Current Every Day Smoker    Packs/day: 0.15    Types: Cigarettes  . Smokeless tobacco: Never Used  Substance Use Topics  . Alcohol use: Yes    Comment: occasionally  . Drug use: No    Comment: quit when found out pregnant      Allergies   Patient has no known allergies.   Review of Systems Review of Systems  Constitutional: Negative for fever.  HENT: Negative for rhinorrhea and sore throat.   Eyes: Negative for redness.  Respiratory: Negative for cough.   Cardiovascular: Negative for chest pain.  Gastrointestinal: Positive for abdominal pain (Epigastric, tight sensation). Negative for diarrhea, nausea and vomiting.  Genitourinary: Negative for dysuria.  Musculoskeletal: Negative for myalgias.  Skin: Negative for rash.  Neurological: Positive for numbness (Paresthesias). Negative for dizziness, seizures, syncope, facial asymmetry, speech difficulty, weakness, light-headedness and headaches.     Physical Exam Updated Vital Signs BP (!) 156/107 (BP Location: Right Arm)   Pulse (!) 104   Temp 98.4 F (36.9 C) (Oral)   Resp 14   SpO2 100%   Physical Exam Vitals signs and nursing note reviewed.  Constitutional:      Appearance: She is well-developed.  HENT:     Head: Normocephalic and  atraumatic.  Eyes:     General:        Right eye: No discharge.        Left eye: No discharge.     Conjunctiva/sclera: Conjunctivae normal.  Neck:     Musculoskeletal: Normal range of motion and neck supple.  Cardiovascular:     Rate and Rhythm: Normal rate and regular rhythm.     Heart sounds: Normal heart sounds.  Pulmonary:     Effort: Pulmonary effort is normal.     Breath sounds: Normal breath sounds.  Abdominal:  General: Abdomen is flat. Bowel sounds are normal. There is no distension.     Palpations: Abdomen is soft.     Tenderness: There is no abdominal tenderness. There is no guarding or rebound.  Musculoskeletal:     Cervical back: Normal.     Thoracic back: Normal.     Lumbar back: Normal.  Skin:    General: Skin is warm and dry.  Neurological:     Mental Status: She is alert.     Cranial Nerves: Cranial nerves are intact.     Sensory: Sensory deficit present.     Motor: No weakness, tremor or abnormal muscle tone.     Coordination: Coordination is intact.     Comments: Decreased sensation of the toes and fingertips.  No full numbness.      ED Treatments / Results  Labs (all labs ordered are listed, but only abnormal results are displayed) Labs Reviewed  COMPREHENSIVE METABOLIC PANEL - Abnormal; Notable for the following components:      Result Value   Potassium 3.2 (*)    CO2 19 (*)    Glucose, Bld 109 (*)    BUN <5 (*)    Alkaline Phosphatase 144 (*)    Total Bilirubin 1.6 (*)    Anion gap 18 (*)    All other components within normal limits  CBC - Abnormal; Notable for the following components:   Hemoglobin 16.0 (*)    MCH 34.8 (*)    MCHC 36.7 (*)    All other components within normal limits  URINALYSIS, ROUTINE W REFLEX MICROSCOPIC - Abnormal; Notable for the following components:   Color, Urine AMBER (*)    APPearance CLOUDY (*)    Hgb urine dipstick SMALL (*)    Bilirubin Urine SMALL (*)    Protein, ur 30 (*)    Nitrite POSITIVE (*)     Leukocytes,Ua LARGE (*)    Bacteria, UA MANY (*)    All other components within normal limits  LIPASE, BLOOD  VITAMIN B12  FOLATE  I-STAT BETA HCG BLOOD, ED ([facility], [facility], [facility] ONLY)    EKG None  Radiology No results found.  Procedures Procedures (including critical care time)  Medications Ordered in ED Medications  sodium chloride flush (NS) 0.9 % injection 3 mL (has no administration in time range)     Initial Impression / Assessment and Plan / ED Course  I have reviewed the triage vital signs and the nursing notes.  Pertinent labs & imaging results that were available during my care of the patient were reviewed by me and considered in my medical decision making (see chart for details).         Patient seen and examined.  Reviewed charting and work-up from previous visit.  I agree with the patient's neurology referral and encouraged her to follow-up for her symptoms.  I do not get the sense that she has any emergency conditions today.  She has no back or neck pain to suggest central cord etiology.  She denies any unilateral weakness to suggest stroke.  Lack of weakness makes transverse myelitis or MS lower on the differential.  Patient does have a history of alcoholism and could potentially have a low B12 or folate level.  We will check these today and have patient follow-up with neurology.  Awaiting abdominal labs.  Vital signs reviewed and are as follows: BP (!) 156/107 (BP Location: Right Arm)   Pulse (!) 104   Temp 98.4 F (36.9 C) (  Oral)   Resp 14   SpO2 100%   If labs look good, will have patient take a multivitamin, follow-up with neurology, start Pepcid for her epigastric discomfort.  Patient does not currently have any abdominal pain or tenderness on exam.  2:16 PM patient's exam is unchanged.  UA suggestive of UTI.  Patient states that she completed a course of Cipro about a month ago.  I reviewed the culture at that time which did not grow any organisms.  We will  cover patient with Keflex, start Pepcid.  Encouraged neurology and PCP follow-up.  We discussed warning symptoms including weakness in her legs or arms which would be more consistent with a central brain or cord problem and she needs to return immediately if any of these occur.  She will also start a multivitamin to help with any deficiencies.  I will follow-up on the patient's B12 and folate testing.  Patient also states that she only took 1 dose of the gabapentin prescribed at previous visit because it made her very tired.  I encouraged her to take this before she goes to bed.  Final Clinical Impressions(s) / ED Diagnoses   Final diagnoses:  Epigastric pain  Idiopathic neuropathy  Acute cystitis without hematuria   Patient here today because her neuropathy is making it difficult for her to work.  Neuropathy is limited to tingling in the foot/toes and fingers.  She does not have any weakness.  She does not have any signs of a spinal infection.  No weakness to suggest transverse myelitis.  I doubt stroke given the bilateral nature of the patient's symptoms.   ED Discharge Orders         Ordered    cephALEXin (KEFLEX) 500 MG capsule  2 times daily     04/28/19 1411    famotidine (PEPCID) 20 MG tablet  2 times daily     04/28/19 1411           Renne CriglerGeiple, Markeis Allman, PA-C 04/28/19 1418    Geoffery Lyonselo, Douglas, MD 04/28/19 1446

## 2019-04-28 NOTE — Discharge Instructions (Signed)
Please read and follow all provided instructions.  Your diagnoses today include:  1. Epigastric pain   2. Idiopathic neuropathy   3. Acute cystitis without hematuria     Tests performed today include:  Blood counts and electrolytes  Blood tests to check liver and kidney function  Blood tests to check pancreas function  Urine test to look for infection -suggests infection  Vital signs. See below for your results today.   Medications prescribed:   Keflex (cephalexin) - antibiotic  You have been prescribed an antibiotic medicine: take the entire course of medicine even if you are feeling better. Stopping early can cause the antibiotic not to work.   Pepcid (famotidine) - antihistamine  You can find this medication over-the-counter.   DO NOT exceed:   20mg  Pepcid every 12 hours  Take any prescribed medications only as directed.  Home care instructions:   Follow any educational materials contained in this packet.  Follow-up instructions: Please follow-up with your primary care provider in the next 3 days for further evaluation of your symptoms.    Please keep the appointment scheduled with the neurologist to talk about your neuropathy.  Please take gabapentin as prescribed.  Return instructions:  SEEK IMMEDIATE MEDICAL ATTENTION IF:  The pain does not go away or becomes severe   A temperature above 101F develops   Repeated vomiting occurs (multiple episodes)   The pain becomes localized to portions of the abdomen. The right side could possibly be appendicitis. In an adult, the left lower portion of the abdomen could be colitis or diverticulitis.   Blood is being passed in stools or vomit (bright red or black tarry stools)   You develop chest pain, difficulty breathing, dizziness or fainting, or become confused, poorly responsive, or inconsolable (young children)  If you have any other emergent concerns regarding your health  Additional Information: Abdominal  (belly) pain can be caused by many things. Your caregiver performed an examination and possibly ordered blood/urine tests and imaging (CT scan, x-rays, ultrasound). Many cases can be observed and treated at home after initial evaluation in the emergency department. Even though you are being discharged home, abdominal pain can be unpredictable. Therefore, you need a repeated exam if your pain does not resolve, returns, or worsens. Most patients with abdominal pain don't have to be admitted to the hospital or have surgery, but serious problems like appendicitis and gallbladder attacks can start out as nonspecific pain. Many abdominal conditions cannot be diagnosed in one visit, so follow-up evaluations are very important.  Your vital signs today were: BP (!) 156/107 (BP Location: Right Arm)    Pulse (!) 104    Temp 98.4 F (36.9 C) (Oral)    Resp 14    SpO2 100%  If your blood pressure (bp) was elevated above 135/85 this visit, please have this repeated by your doctor within one month. --------------

## 2019-04-28 NOTE — ED Triage Notes (Addendum)
Pt presents with epigastric pain x2 months and bilateral foot numbness and pain x3 weeks. Seen here for the same.  Pt denies abd pain today. States "it just happens out of the blue.

## 2019-05-05 NOTE — Progress Notes (Signed)
Patient ID: Marcia Jackson, female   DOB: [birth year], 28 y.o.   MRN: [id number] Virtual Visit via Telephone Note  I connected with Marcia Jackson on 05/12/19 at 10:30 AM EDT by telephone and verified that I am speaking with the correct person using two identifiers.   I discussed the limitations, risks, security and privacy concerns of performing an evaluation and management service by telephone and the availability of in person appointments. I also discussed with the patient that there may be a patient responsible charge related to this service. The patient expressed understanding and agreed to proceed.  Patient location:  home My Location:  home office Persons on the call:  Me and the patient     History of Present Illness: After multiple visits to ED-most recently 04/28/2019 for UTI and neuropathy.  Says the gabapentin is helping but she is unable to work.  Says she is no longer drinking alcohol. She is taking a folate supplement.  Urine s/sx have resolved  From ED note 9/16: Patient with history of alcohol use presents the emergency department with complaint of bilateral foot and finger numbness that is been ongoing for several weeks.  Patient was seen in the emergency department on 04/22/19.  She had labs performed.  She was started on low-dose of gabapentin and encouraged to follow-up with neurology.  Patient states that she has an appointment in October.  Patient presents the emergency department today because she was having trouble walking because of the numbness and tingling in her feet while at work.  She states that they would not let her work any longer because of the way she was walking.  This prompted emergency department evaluation today.  There have been no significant changes in her symptoms since last visit.  She denies any weakness in her arms, legs or falls.  The tingling is from the middle of the foot to the toes.  It is similar on both sides.  She does not have any acute back  pain or neck pain.  No headaches or vision changes.  She denies any injuries.    Patient states that she currently drinks occasionally.  Up until a few weeks ago she was a heavy, every day drinker.  She has been attempting to cut back.  Patient denies any IV drug use.  She denies unexplained fevers or weight loss.  She denies history of cancer.  From A/P: Patient seen and examined.  Reviewed charting and work-up from previous visit.  I agree with the patient's neurology referral and encouraged her to follow-up for her symptoms.  I do not get the sense that she has any emergency conditions today.  She has no back or neck pain to suggest central cord etiology.  She denies any unilateral weakness to suggest stroke.  Lack of weakness makes transverse myelitis or MS lower on the differential.  Patient does have a history of alcoholism and could potentially have a low B12 or folate level.  We will check these today and have patient follow-up with neurology.  Awaiting abdominal labs.  If labs look good, will have patient take a multivitamin, follow-up with neurology, start Pepcid for her epigastric discomfort.  Patient does not currently have any abdominal pain or tenderness on exam.  2:16 PM patient's exam is unchanged.  UA suggestive of UTI.  Patient states that she completed a course of Cipro about a month ago.  I reviewed the culture at that time which did not grow any organisms.  We will cover patient with Keflex, start Pepcid.  Encouraged neurology and PCP follow-up.  We discussed warning symptoms including weakness in her legs or arms which would be more consistent with a central brain or cord problem and she needs to return immediately if any of these occur.  She will also start a multivitamin to help with any deficiencies.  I will follow-up on the patient's B12 and folate testing.  Patient also states that she only took 1 dose of the gabapentin prescribed at previous visit because it made her very  tired.  I encouraged her to take this before she goes to bed.    Observations/Objective:  NAD   Assessment and Plan: 1. Neuropathy - Ambulatory referral to Neurology  2. Hypokalemia - Basic metabolic panel; Future  Avoid alcohol  Follow Up Instructions: Assign PCP 1-2 months   I discussed the assessment and treatment plan with the patient. The patient was provided an opportunity to ask questions and all were answered. The patient agreed with the plan and demonstrated an understanding of the instructions.   The patient was advised to call back or seek an in-person evaluation if the symptoms worsen or if the condition fails to improve as anticipated.  I provided 12 minutes of non-face-to-face time during this encounter.   Georgian Co, PA-C

## 2019-05-06 NOTE — Progress Notes (Signed)
[facility] Clinic Note - Initial Visit   Date: 05/07/19  Marcia Jackson MRN: [id number] DOB: 1991/08/06    History of Present Illness: Marcia Jackson is a 28 y.o. right-handed female with alcohol abuse presenting for evaluation of bilateral feet pain.   Starting around August 2020, she began having achy, throbbing pain, as well as tingling in the balls of the feet and toes.  Pain is worse at night time and often prevents her from falling asleep.  She also complains of numbness in her lower legs.  Symptoms are constant and exacerbated by nothing.  She denies leg or feet weakness or imbalance.  She walks unassisted and has not suffered any falls.   Starting around March 2020, she began drinking half-bottle of liquor daily.  She was drinking intermittently throughout the week prior to this, but not to this extent.  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   She went to the ER on 9/16 where labs indicated folate deficiency, low potassium, and mild elevation in alkaline phosphatase and total bilirubin.  She was started on folic acid 2mg  daily and gabapentin 100mg  TID.  She did not appreciate any pain relief with gabapentin, so stopped it.   She does not have insurance or a PCP.   Out-side paper records, electronic medical record, and images have been reviewed where available and summarized as:  Lab Results  Component Value Date   CREATININE 0.69 04/28/2019   BUN <5 (L) 04/28/2019   NA 136 04/28/2019   K 3.2 (L) 04/28/2019   CL 99 04/28/2019   CO2 19 (L) 04/28/2019    Lab Results  Component Value Date   VITAMINB12 440 04/28/2019   Lab Results  Component Value Date   FOLATE 3.8 (L) 04/28/2019     Past Medical History:  Diagnosis Date  . Depression     Past Surgical History:  Procedure Laterality Date  . NO PAST SURGERIES    . wisom teeth       Medications:  Outpatient Encounter  Medications as of 05/07/2019  Medication Sig  . cephALEXin (KEFLEX) 500 MG capsule Take 1 capsule (500 mg total) by mouth 2 (two) times daily.  . cyclobenzaprine (FLEXERIL) 5 MG tablet Take 1-2 tablets (5-10 mg total) by mouth 2 (two) times daily as needed for muscle spasms.  04/30/2019 doxycycline (VIBRAMYCIN) 100 MG capsule Take 1 capsule (100 mg total) by mouth 2 (two) times daily.  . famotidine (PEPCID) 20 MG tablet Take 1 tablet (20 mg total) by mouth 2 (two) times daily.  . folic acid (FOLVITE) 1 MG tablet Take 2 tablets (2 mg total) by mouth daily.  . hydroxypropyl methylcellulose / hypromellose (ISOPTO TEARS / GONIOVISC) 2.5 % ophthalmic solution Place 1 drop into both eyes 3 (three) times daily as needed for dry eyes.  05/09/2019 ibuprofen (ADVIL,MOTRIN) 800 MG tablet Take 1 tablet (800 mg total) by mouth 3 (three) times daily.  . potassium chloride SA (K-DUR) 20 MEQ tablet Take 1 tablet (20 mEq total) by mouth 2 (two) times daily for 4 days.  . promethazine (PHENERGAN) 25 MG suppository Place 1 suppository (25 mg total) rectally every 6 (six) hours as needed for nausea.  . promethazine (PHENERGAN) 25 MG tablet Take 1 tablet (25 mg total) by mouth every 6 (six) hours as needed for nausea or vomiting.  . [DISCONTINUED] gabapentin (NEURONTIN) 100 MG capsule Take 1 capsule (  100 mg total) by mouth 3 (three) times daily for 14 days.  Marland Kitchen. gabapentin (NEURONTIN) 300 MG capsule Take 1 tablet at bedtime for one week, then increase to 1 tab twice daily.  . ondansetron (ZOFRAN-ODT) 4 MG disintegrating tablet Take 1 tablet (4 mg total) by mouth every 8 (eight) hours as needed for nausea or vomiting. (Patient not taking: Reported on 05/07/2019)   No facility-administered encounter medications on file as of 05/07/2019.     Allergies: No Known Allergies  Family History: Family History  Problem Relation Age of Onset  . Diabetes Maternal Aunt   . Cancer Maternal Grandmother   . Diabetes Maternal Grandmother   . Healthy  Mother   . Healthy Father     Social History: Social History   Tobacco Use  . Smoking status: Former Smoker    Packs/day: 0.15    Types: Cigarettes  . Smokeless tobacco: Never Used  Substance Use Topics  . Alcohol use: Yes    Comment: half-bottle of liquor daily  . Drug use: No    Comment: quit when found out pregnant    Social History   Social History Narrative   2 children   Two story house   Works at [employer]   Right handed    Review of Systems:  CONSTITUTIONAL: No fevers, chills, night sweats, or weight loss.   EYES: No visual changes or eye pain ENT: No hearing changes.  No history of nose bleeds.   RESPIRATORY: No cough, wheezing and shortness of breath.   CARDIOVASCULAR: Negative for chest pain, and palpitations.   GI: Negative for abdominal discomfort, blood in stools or black stools.  No recent change in bowel habits.   GU:  No history of incontinence.   MUSCLOSKELETAL: No history of joint pain or swelling.  No myalgias.   SKIN: Negative for lesions, rash, and itching.   HEMATOLOGY/ONCOLOGY: Negative for prolonged bleeding, bruising easily, and swollen nodes.  No history of cancer.   ENDOCRINE: Negative for cold or heat intolerance, polydipsia or goiter.   PSYCH:  No depression or anxiety symptoms.   NEURO: As Above.   Vital Signs:  BP 110/80   Pulse (!) 101   Ht 5\' 2"  (1.575 m)   Wt 111 lb (50.3 kg)   SpO2 98%   BMI 20.30 kg/m    General Medical Exam:   General:  Thin-appearing, comfortable.   Eyes/ENT: see cranial nerve examination.   Neck:   No carotid bruits. Respiratory:  Clear to auscultation, good air entry bilaterally.   Cardiac:  Regular rate and rhythm, no murmur.   Extremities:  No deformities, edema, or skin discoloration.  Skin:  No rashes or lesions.  Neurological Exam: MENTAL STATUS including orientation to time, place, person, recent and remote memory, attention span and concentration, language, and fund of knowledge is normal.   Speech is not dysarthric.  CRANIAL NERVES: II:  No visual field defects.  III-IV-VI: Pupils equal round and reactive to light.  Normal conjugate, extra-ocular eye movements in all directions of gaze.  No nystagmus.  No ptosis.   V:  Normal facial sensation.    VII:  Normal facial symmetry and movements.   VIII:  Normal hearing and vestibular function.   IX-X:  Normal palatal movement.   XI:  Normal shoulder shrug and head rotation.   XII:  Normal tongue strength and range of motion, no deviation or fasciculation.  MOTOR:  No atrophy, fasciculations or abnormal movements.  No pronator drift.  Upper Extremity:  Right  Left  Deltoid  5/5   5/5   Biceps  5/5   5/5   Triceps  5/5   5/5   Infraspinatus 5/5  5/5  Medial pectoralis 5/5  5/5  Wrist extensors  5/5   5/5   Wrist flexors  5/5   5/5   Finger extensors  5/5   5/5   Finger flexors  5/5   5/5   Dorsal interossei  5/5   5/5   Abductor pollicis  5/5   5/5   Tone (Ashworth scale)  0  0   Lower Extremity:  Right  Left  Hip flexors  5/5   5/5   Hip extensors  5/5   5/5   Adductor 5/5  5/5  Abductor 5/5  5/5  Knee flexors  5/5   5/5   Knee extensors  5/5   5/5   Dorsiflexors  5/5   5/5   Plantarflexors  5/5   5/5   Toe extensors  5/5   5/5   Toe flexors  5/5   5/5   Tone (Ashworth scale)  0  0   MSRs:  Right        Left                  brachioradialis 2+  2+  biceps 2+  2+  triceps 2+  2+  patellar 2+  2+  ankle jerk 0  0  Hoffman no  no  plantar response down  down   SENSORY: Reduced pin prick distal to lower legs and into the feet.  Vibration and temperature seems to be intact in the feet and hands. Romberg's sign absent.   COORDINATION/GAIT: Normal finger-to- nose-finger and heel-to-shin.  Intact rapid alternating movements bilaterally. Gait appears antalgic, trying to avoid putting pressure on the balls of her feet.  She is able to walk on heels, unable to walk on toes due to pain  IMPRESSION: 1.   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  There is no distal weakness or sensory ataxia. 2.  Folate deficiency 3.  Probably vitamin B1 and B12 deficiency   PLAN/RECOMMENDATIONS:  Patient is self-pay and unable to afford additional testing such as labs and EDX, therefore, I discussed the diagnosis at length and counseled her on the importance of alcohol cessation, otherwise neuropathy will only get worse. She seems willing to try to do this.  I also recommend empirically treating her for thiamine and B12 deficiency Continue folic acid 2mg  daily Start thiamine 100mg  daily Start vitamin B12 1063mcg daily For pain, start gabapentin 300mg  at bedtime x 1 week, then increase to 300mg  BID Recommend that she establish care with primary care doctor   Return to clinic as needed   Thank you for allowing me to participate in patient's care.  If I can answer any additional questions, I would be pleased to do so.    Sincerely,    Jonpaul Lumm K. Posey Pronto, DO

## 2019-05-07 ENCOUNTER — Other Ambulatory Visit: Payer: Self-pay

## 2019-05-07 ENCOUNTER — Encounter: Payer: Self-pay | Admitting: Neurology

## 2019-05-07 ENCOUNTER — Ambulatory Visit (INDEPENDENT_AMBULATORY_CARE_PROVIDER_SITE_OTHER): Payer: Self-pay | Admitting: Neurology

## 2019-05-07 VITALS — BP 110/80 | HR 101 | Ht 62.0 in | Wt 111.0 lb

## 2019-05-07 DIAGNOSIS — E538 Deficiency of other specified B group vitamins: Secondary | ICD-10-CM

## 2019-05-07 DIAGNOSIS — G621 Alcoholic polyneuropathy: Secondary | ICD-10-CM

## 2019-05-07 MED ORDER — GABAPENTIN 300 MG PO CAPS: ORAL_CAPSULE | ORAL | 5 refills | Status: DC

## 2019-05-07 NOTE — Patient Instructions (Signed)
Continue folic acid 2mg  daily  Start vitamin B1 (thiamine) 100mg  daily  Start vitamin B12 1068mcg daily  STOP ALCOHOL CONSUMPTION  Eat healthy, nutritious meals.  Establish care with primary care doctor

## 2019-05-12 ENCOUNTER — Ambulatory Visit: Payer: Self-pay | Attending: Family Medicine | Admitting: Physician Assistant

## 2019-05-12 ENCOUNTER — Other Ambulatory Visit: Payer: Self-pay

## 2019-05-12 DIAGNOSIS — E876 Hypokalemia: Secondary | ICD-10-CM

## 2019-05-12 DIAGNOSIS — G629 Polyneuropathy, unspecified: Secondary | ICD-10-CM

## 2019-05-14 ENCOUNTER — Ambulatory Visit: Payer: Self-pay | Attending: Family Medicine

## 2019-05-14 ENCOUNTER — Other Ambulatory Visit: Payer: Self-pay

## 2019-05-14 DIAGNOSIS — E876 Hypokalemia: Secondary | ICD-10-CM

## 2019-05-15 LAB — BASIC METABOLIC PANEL
BUN/Creatinine Ratio: 10 (ref 9–23)
BUN: 7 mg/dL (ref 6–20)
CO2: 20 mmol/L (ref 20–29)
Calcium: 9.9 mg/dL (ref 8.7–10.2)
Chloride: 105 mmol/L (ref 96–106)
Creatinine, Ser: 0.68 mg/dL (ref 0.57–1.00)
GFR calc Af Amer: 139 mL/min/{1.73_m2} (ref >59)
GFR calc non Af Amer: 120 mL/min/{1.73_m2} (ref >59)
Glucose: 121 mg/dL — ABNORMAL HIGH (ref 65–99)
Potassium: 3.8 mmol/L (ref 3.5–5.2)
Sodium: 142 mmol/L (ref 134–144)

## 2019-05-17 ENCOUNTER — Ambulatory Visit: Payer: Medicaid Other | Admitting: Neurology

## 2019-05-19 ENCOUNTER — Telehealth: Payer: Self-pay | Admitting: Neurology

## 2019-05-19 DIAGNOSIS — Z0279 Encounter for issue of other medical certificate: Secondary | ICD-10-CM

## 2019-05-19 NOTE — Telephone Encounter (Signed)
Neuropathy does not quality for her to be out of work.  She may resume work.  I will review her paperwork and decide whether it applies to her or not.

## 2019-05-19 NOTE — Telephone Encounter (Signed)
1. Patient stopped by the office and dropped off FMLA paperwork to be completed today. FYI only.  2. Patient requested a message be sent to Dr. Posey Pronto about a return to work date for her. When can the patient return to work?

## 2019-05-19 NOTE — Telephone Encounter (Signed)
See below

## 2019-05-19 NOTE — Telephone Encounter (Signed)
Patient states that she needs a note stating that she can go back to work when she picks up her forms.

## 2019-05-19 NOTE — Telephone Encounter (Signed)
Spoke with patient she was made aware of provider response below. She understands

## 2019-05-20 NOTE — Telephone Encounter (Signed)
Copy made for patient chart  Spoke with patient she will pick up [facility] [employer] tomorrow.  She also states that she needs a note to release her back to work. If this can be done she will pick up note along with [facility] [employer] [facility] tomorrow.

## 2019-05-20 NOTE — Telephone Encounter (Signed)
Form completed and ready for pick up  

## 2019-05-20 NOTE — Telephone Encounter (Signed)
Work note place with FMLA forms at front desk

## 2019-05-24 ENCOUNTER — Ambulatory Visit: Payer: Medicaid Other | Admitting: Neurology

## 2020-01-03 ENCOUNTER — Ambulatory Visit: Payer: Medicaid Other | Attending: Internal Medicine

## 2020-01-03 DIAGNOSIS — Z20822 Contact with and (suspected) exposure to covid-19: Secondary | ICD-10-CM

## 2020-01-04 LAB — SARS-COV-2, NAA 2 DAY TAT

## 2020-01-04 LAB — NOVEL CORONAVIRUS, NAA: SARS-CoV-2, NAA: NOT DETECTED

## 2020-05-19 ENCOUNTER — Encounter (HOSPITAL_COMMUNITY): Payer: Self-pay

## 2020-05-19 ENCOUNTER — Ambulatory Visit (HOSPITAL_COMMUNITY)
Admission: EM | Admit: 2020-05-19 | Discharge: 2020-05-19 | Disposition: A | Discharge status: Home / Self Care | Payer: Medicaid Other | Attending: Internal Medicine | Admitting: Internal Medicine

## 2020-05-19 ENCOUNTER — Other Ambulatory Visit: Payer: Self-pay

## 2020-05-19 DIAGNOSIS — S335XXA Sprain of ligaments of lumbar spine, initial encounter: Secondary | ICD-10-CM

## 2020-05-19 MED ORDER — IBUPROFEN 600 MG PO TABS: 600.0000 mg | ORAL_TABLET | Freq: Three times a day (TID) | ORAL | 0 refills | Status: DC

## 2020-05-19 MED ORDER — GABAPENTIN 300 MG PO CAPS: ORAL_CAPSULE | ORAL | 0 refills | Status: DC

## 2020-05-19 MED ORDER — CYCLOBENZAPRINE HCL 5 MG PO TABS: 5.0000 mg | ORAL_TABLET | Freq: Three times a day (TID) | ORAL | 0 refills | Status: DC | PRN
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
## 2020-07-18 ENCOUNTER — Ambulatory Visit: Payer: Medicaid Other | Admitting: Nurse Practitioner

## 2020-08-11 ENCOUNTER — Other Ambulatory Visit: Payer: Self-pay

## 2020-11-17 ENCOUNTER — Ambulatory Visit (HOSPITAL_COMMUNITY)
Admission: EM | Admit: 2020-11-17 | Discharge: 2020-11-17 | Disposition: A | Discharge status: Home / Self Care | Payer: Medicaid Other | Attending: Family Medicine | Admitting: Family Medicine

## 2020-11-17 ENCOUNTER — Other Ambulatory Visit: Payer: Self-pay

## 2020-11-17 ENCOUNTER — Encounter (HOSPITAL_COMMUNITY): Payer: Self-pay | Admitting: Emergency Medicine

## 2020-11-17 DIAGNOSIS — M79604 Pain in right leg: Secondary | ICD-10-CM

## 2020-11-17 MED ORDER — GABAPENTIN 300 MG PO CAPS: 300.0000 mg | ORAL_CAPSULE | Freq: Three times a day (TID) | ORAL | 0 refills | Status: DC | PRN

## 2020-11-17 MED ORDER — IBUPROFEN 600 MG PO TABS: 600.0000 mg | ORAL_TABLET | Freq: Three times a day (TID) | ORAL | 0 refills | Status: DC | PRN

## 2020-11-17 MED ORDER — CYCLOBENZAPRINE HCL 5 MG PO TABS: 5.0000 mg | ORAL_TABLET | Freq: Three times a day (TID) | ORAL | 0 refills | Status: DC | PRN

## 2020-11-17 NOTE — ED Triage Notes (Signed)
Pt presents today with c/o of right upper leg pain that radiates down leg x 2 days. Denies injury. +ambulatory

## 2020-11-17 NOTE — ED Provider Notes (Signed)
[facility]    CSN: [id number] Arrival date & time: 11/17/20  7782      History   Chief Complaint Chief Complaint  Patient presents with  . Leg Pain    right    HPI Marcia Jackson is a 30 y.o. female.   Patient presenting today with 1 day history of right upper leg pain and soreness throughout the entire rest of the leg.  She states it started last night and got much worse this morning to where she can tolerate bearing weight on the leg without the soreness getting worse.  She denies known injury, redness, swelling, numbness, tingling, history of back issues.  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  She is not been trying anything over-the-counter at this time.     Past Medical History:  Diagnosis Date  . Depression     Patient Active Problem List   Diagnosis Date Noted  . Encounter for other contraceptive management 02/03/2017  . Rh negative, antepartum 05/17/2016  . Rubella non-immune status, antepartum 05/17/2016  . Family history of congenital heart defect 05/07/2016    Past Surgical History:  Procedure Laterality Date  . NO PAST SURGERIES    . wisom teeth      OB History    Gravida  2   Para  2   Term  2   Preterm      AB      Living  2     SAB      IAB      Ectopic      Multiple  0   Live Births  2            Home Medications    Prior to Admission medications   Medication Sig Start Date End Date Taking? Authorizing Provider  cyclobenzaprine (FLEXERIL) 5 MG tablet Take 1 tablet (5 mg total) by mouth 3 (three) times daily as needed for muscle spasms. 11/17/20   Particia Nearing, PA-C  folic acid (FOLVITE) 1 MG tablet Take 2 tablets (2 mg total) by mouth daily. 04/28/19   Renne Crigler, PA-C  gabapentin (NEURONTIN) 300 MG capsule Take 1 capsule (300 mg total) by mouth 3 (three) times daily as needed.  Take 1 tablet at bedtime for one week, then increase to 1 tab twice daily. 11/17/20   Particia Nearing, PA-C  hydroxypropyl methylcellulose / hypromellose (ISOPTO TEARS / GONIOVISC) 2.5 % ophthalmic solution Place 1 drop into both eyes 3 (three) times daily as needed for dry eyes.    [provider]  ibuprofen (ADVIL) 600 MG tablet Take 1 tablet (600 mg total) by mouth every 8 (eight) hours as needed. 11/17/20   Particia Nearing, PA-C  ondansetron (ZOFRAN-ODT) 4 MG disintegrating tablet Take 1 tablet (4 mg total) by mouth every 8 (eight) hours as needed for nausea or vomiting. 03/20/19   Wieters, Hallie C, PA-C  famotidine (PEPCID) 20 MG tablet Take 1 tablet (20 mg total) by mouth 2 (two) times daily. Patient not taking: Reported on 05/12/2019 04/28/19 05/19/20  Renne Crigler, PA-C  potassium chloride SA (K-DUR) 20 MEQ tablet Take 1 tablet (20 mEq total) by mouth 2 (two) times daily for 4 days. Patient not taking: Reported on 05/12/2019 04/22/19 05/19/20  Pricilla Loveless, MD  promethazine (PHENERGAN) 25 MG tablet Take 1 tablet (25 mg  total) by mouth every 6 (six) hours as needed for nausea or vomiting. Patient not taking: Reported on 05/12/2019 03/22/19 05/19/20  Derwood Kaplan, MD    Family History Family History  Problem Relation Age of Onset  . Diabetes Maternal Aunt   . Cancer Maternal Grandmother   . Diabetes Maternal Grandmother   . Healthy Mother   . Healthy Father     Social History Social History   Tobacco Use  . Smoking status: Former Smoker    Packs/day: 0.15    Types: Cigarettes  . Smokeless tobacco: Never Used  Vaping Use  . Vaping Use: Never used  Substance Use Topics  . Alcohol use: Yes    Comment: occ  . Drug use: No    Comment: quit when found out pregnant      Allergies   Patient has no known allergies.   Review of Systems Review of Systems Per HPI Physical Exam Triage Vital Signs ED Triage Vitals  Enc Vitals Group     BP 11/17/20 1008  127/89     Pulse Rate 11/17/20 1008 86     Resp 11/17/20 1008 18     Temp 11/17/20 1008 98 F (36.7 C)     Temp Source 11/17/20 1008 Oral     SpO2 11/17/20 1008 99 %     Weight 11/17/20 1009 113 lb (51.3 kg)     Height 11/17/20 1009 5\' 2"  (1.575 m)     Head Circumference --      Peak Flow --      Pain Score 11/17/20 1009 6     Pain Loc --      Pain Edu? --      Excl. in GC? --    No data found.  Updated Vital Signs BP 127/89 (BP Location: Right Arm)   Pulse 86   Temp 98 F (36.7 C) (Oral)   Resp 18   Ht 5\' 2"  (1.575 m)   Wt 113 lb (51.3 kg)   SpO2 99%   BMI 20.67 kg/m   Visual Acuity Right Eye Distance:   Left Eye Distance:   Bilateral Distance:    Right Eye Near:   Left Eye Near:    Bilateral Near:     Physical Exam Vitals and nursing note reviewed.  Constitutional:      Appearance: Normal appearance. She is not ill-appearing.  HENT:     Head: Atraumatic.  Eyes:     Extraocular Movements: Extraocular movements intact.     Conjunctiva/sclera: Conjunctivae normal.  Cardiovascular:     Rate and Rhythm: Normal rate and regular rhythm.     Heart sounds: Normal heart sounds.  Pulmonary:     Effort: Pulmonary effort is normal.     Breath sounds: Normal breath sounds.  Musculoskeletal:        General: Tenderness present. No swelling. Normal range of motion.     Cervical back: Normal range of motion and neck supple.     Comments: Minimal anterior right hip musculature tender to palpation Pain reproducible with range of motion against resistance flexion at right hip  Skin:    General: Skin is warm and dry.  Neurological:     Mental Status: She is alert and oriented to person, place, and time.     Comments: Right lower extremity neurovascularly intact  Psychiatric:        Mood and Affect: Mood normal.        Thought Content: Thought content normal.  Judgment: Judgment normal.      UC Treatments / Results  Labs (all labs ordered are listed, but  only abnormal results are displayed) Labs Reviewed - No data to display  EKG   Radiology No results found.  Procedures Procedures (including critical care time)  Medications Ordered in UC Medications - No data to display  Initial Impression / Assessment and Plan / UC Course  I have reviewed the triage vital signs and the nursing notes.  Pertinent labs & imaging results that were available during my care of the patient were reviewed by me and considered in my medical decision making (see chart for details).     Pain appears muscular in nature.  We will refill her gabapentin that she was previously on and start Flexeril, ibuprofen.  Work note given for rest.  Stretches, Epsom salt soaks, heat recommended.  Establish care with primary care for recheck.  Final Clinical Impressions(s) / UC Diagnoses   Final diagnoses:  Right leg pain   Discharge Instructions   None    ED Prescriptions    Medication Sig Dispense Auth. Provider   cyclobenzaprine (FLEXERIL) 5 MG tablet Take 1 tablet (5 mg total) by mouth 3 (three) times daily as needed for muscle spasms. 24 tablet Particia Nearing, [place]   gabapentin (NEURONTIN) 300 MG capsule Take 1 capsule (300 mg total) by mouth 3 (three) times daily as needed. Take 1 tablet at bedtime for one week, then increase to 1 tab twice daily. 90 capsule Particia Nearing, [place]   ibuprofen (ADVIL) 600 MG tablet Take 1 tablet (600 mg total) by mouth every 8 (eight) hours as needed. 30 tablet Particia Nearing, [place]     PDMP not reviewed this encounter.   Particia Nearing, [place] 11/17/20 1058

## 2020-11-29 ENCOUNTER — Other Ambulatory Visit: Payer: Self-pay | Admitting: Family Medicine

## 2020-12-21 ENCOUNTER — Encounter (HOSPITAL_COMMUNITY): Payer: Self-pay

## 2020-12-21 ENCOUNTER — Emergency Department (HOSPITAL_COMMUNITY)
Admission: EM | Admit: 2020-12-21 | Discharge: 2020-12-21 | Disposition: A | Discharge status: Home / Self Care | Payer: Medicaid Other | Attending: Emergency Medicine | Admitting: Emergency Medicine

## 2020-12-21 ENCOUNTER — Other Ambulatory Visit: Payer: Self-pay

## 2020-12-21 DIAGNOSIS — Z87891 Personal history of nicotine dependence: Secondary | ICD-10-CM | POA: Insufficient documentation

## 2020-12-21 DIAGNOSIS — L0201 Cutaneous abscess of face: Secondary | ICD-10-CM | POA: Insufficient documentation

## 2020-12-21 MED ORDER — LIDOCAINE-EPINEPHRINE-TETRACAINE (LET) TOPICAL GEL
3.0000 mL | Freq: Once | TOPICAL | Status: AC
  Administered 2020-12-21: 3 mL via TOPICAL
  Filled 2020-12-21: qty 3

## 2020-12-21 MED ORDER — IBUPROFEN 800 MG PO TABS
800.0000 mg | ORAL_TABLET | Freq: Three times a day (TID) | ORAL | 0 refills | Status: DC
Start: 2020-12-21 — End: 2021-05-20

## 2020-12-21 MED ORDER — IBUPROFEN 800 MG PO TABS
800.0000 mg | ORAL_TABLET | Freq: Once | ORAL | Status: AC
  Administered 2020-12-21: 800 mg via ORAL
  Filled 2020-12-21: qty 1

## 2020-12-21 MED ORDER — SULFAMETHOXAZOLE-TRIMETHOPRIM 800-160 MG PO TABS
1.0000 | ORAL_TABLET | Freq: Once | ORAL | Status: AC
  Administered 2020-12-21: 1 via ORAL
  Filled 2020-12-21: qty 1

## 2020-12-21 MED ORDER — SULFAMETHOXAZOLE-TRIMETHOPRIM 800-160 MG PO TABS: 1.0000 | ORAL_TABLET | Freq: Two times a day (BID) | ORAL | 0 refills | Status: AC

## 2020-12-21 NOTE — ED Provider Notes (Signed)
[facility] EMERGENCY DEPARTMENT Provider Note   CSN: [id number] Arrival date & time: 12/21/20  9211     History Chief Complaint  Patient presents with  . Abscess    Marcia Jackson is a 30 y.o. female.  HPI 4 days ago patient reports she developed a bump beside her nose on the left side.  It got larger and after 2 days she tried to squeeze pus out of it.  She reports she got a little bit of pus out of it.  The second day she tried it again.  She reports after that it started to get much larger and red.  Today she reports the redness and swelling has moved up to be beneath her eye.  She went to work, her friends encouraged her to come to the emergency department to seek treatment.  She has not fever or chills otherwise feeling well.  No medical history.  She reports she has had some other abscesses in the past that required drainage.  It was in her axilla and one was on the buttocks.  No recent antibiotics.    Past Medical History:  Diagnosis Date  . Depression     Patient Active Problem List   Diagnosis Date Noted  . Encounter for other contraceptive management 02/03/2017  . Rh negative, antepartum 05/17/2016  . Rubella non-immune status, antepartum 05/17/2016  . Family history of congenital heart defect 05/07/2016    Past Surgical History:  Procedure Laterality Date  . NO PAST SURGERIES    . wisom teeth       OB History    Gravida  2   Para  2   Term  2   Preterm      AB      Living  2     SAB      IAB      Ectopic      Multiple  0   Live Births  2           Family History  Problem Relation Age of Onset  . Diabetes Maternal Aunt   . Cancer Maternal Grandmother   . Diabetes Maternal Grandmother   . Healthy Mother   . Healthy Father     Social History   Tobacco Use  . Smoking status: Former Smoker    Packs/day: 0.15    Types: Cigarettes  . Smokeless tobacco: Never Used  Vaping Use  . Vaping Use: Never used   Substance Use Topics  . Alcohol use: Yes    Comment: occ  . Drug use: No    Comment: quit when found out pregnant     Home Medications Prior to Admission medications   Medication Sig Start Date End Date Taking? Authorizing Provider  gabapentin (NEURONTIN) 300 MG capsule Take 1 capsule (300 mg total) by mouth 3 (three) times daily as needed. Take 1 tablet at bedtime for one week, then increase to 1 tab twice daily. Patient taking differently: Take 300 mg by mouth at bedtime. Take 1 tablet at bedtime for one week, then increase to 1 tab twice daily. 11/17/20  Yes Particia Nearing, PA-C  ibuprofen (ADVIL) 800 MG tablet Take 1 tablet (800 mg total) by mouth 3 (three) times daily. 12/21/20  Yes Arby Barrette, MD  sulfamethoxazole-trimethoprim (BACTRIM DS) 800-160 MG tablet Take 1 tablet by mouth 2 (two) times daily for 7 days. 12/21/20 12/28/20 Yes Bea Duren, Lebron Conners, MD  cyclobenzaprine (FLEXERIL) 5 MG tablet Take 1 tablet (  5 mg total) by mouth 3 (three) times daily as needed for muscle spasms. Patient not taking: Reported on 12/21/2020 11/17/20   Particia Nearing, PA-C  folic acid (FOLVITE) 1 MG tablet Take 2 tablets (2 mg total) by mouth daily. Patient not taking: Reported on 12/21/2020 04/28/19   Renne Crigler, PA-C  ondansetron (ZOFRAN-ODT) 4 MG disintegrating tablet Take 1 tablet (4 mg total) by mouth every 8 (eight) hours as needed for nausea or vomiting. Patient not taking: Reported on 12/21/2020 03/20/19   Wieters, Hallie C, PA-C  famotidine (PEPCID) 20 MG tablet Take 1 tablet (20 mg total) by mouth 2 (two) times daily. Patient not taking: Reported on 05/12/2019 04/28/19 05/19/20  Renne Crigler, PA-C  potassium chloride SA (K-DUR) 20 MEQ tablet Take 1 tablet (20 mEq total) by mouth 2 (two) times daily for 4 days. Patient not taking: Reported on 05/12/2019 04/22/19 05/19/20  Pricilla Loveless, MD  promethazine (PHENERGAN) 25 MG tablet Take 1 tablet (25 mg total) by mouth every 6 (six) hours as  needed for nausea or vomiting. Patient not taking: Reported on 05/12/2019 03/22/19 05/19/20  Derwood Kaplan, MD    Allergies    Patient has no known allergies.  Review of Systems   Review of Systems Constitutional: No fever no chills no malaise Neurologic: No headache no visual changes no incoordination ENT: No earache no sore throat Physical Exam Updated Vital Signs BP (!) 132/97 (BP Location: Right Arm)   Pulse 98   Temp 98.6 F (37 C) (Oral)   Resp 15   LMP 12/11/2020   SpO2 99%   Physical Exam Constitutional:      Comments: Alert nontoxic clear mental status  HENT:     Nose: Nose normal.     Mouth/Throat:     Mouth: Mucous membranes are moist.     Pharynx: Oropharynx is clear.  Eyes:     Extraocular Movements: Extraocular movements intact.     Conjunctiva/sclera: Conjunctivae normal.     Pupils: Pupils are equal, round, and reactive to light.  Cardiovascular:     Rate and Rhythm: Normal rate and regular rhythm.  Pulmonary:     Effort: Pulmonary effort is normal.     Breath sounds: Normal breath sounds.  Musculoskeletal:        General: Normal range of motion.     Cervical back: Neck supple.  Skin:    General: Skin is warm.  Neurological:     General: No focal deficit present.  Psychiatric:        Mood and Affect: Mood normal.       ED Results / Procedures / Treatments   Labs (all labs ordered are listed, but only abnormal results are displayed) Labs Reviewed - No data to display  EKG None  Radiology No results found.  Procedures .Marland KitchenIncision and Drainage  Date/Time: 12/21/2020 9:47 AM Performed by: Arby Barrette, MD Authorized by: Arby Barrette, MD   Consent:    Consent obtained:  Verbal   Consent given by:  Patient   Risks, benefits, and alternatives were discussed: yes     Risks discussed:  Bleeding, incomplete drainage, pain, infection and damage to other organs   Alternatives discussed:  Delayed treatment Universal protocol:     Patient identity confirmed:  Verbally with patient Location:    Type:  Abscess   Location:  Head   Head location:  Face Pre-procedure details:    Skin preparation:  Povidone-iodine Sedation:    Sedation type:  None  Anesthesia:    Anesthesia method:  Topical application   Topical anesthetic:  LET Procedure type:    Complexity:  Simple Procedure details:    Incision types:  Stab incision   Incision depth:  Dermal   Drainage:  Purulent   Drainage amount:  Scant   Wound treatment:  Wound left open Post-procedure details:    Procedure completion:  Tolerated Comments:     Ultrasound used to identify a small pus pocket beneath the eschar overlying the abscess.  Area sterilely prepped with iodine.  Eschar unroofed and a small single stab incision used.  small amount of purulent drainage expressed.     Medications Ordered in ED Medications  lidocaine-EPINEPHrine-tetracaine (LET) topical gel (3 mLs Topical Given 12/21/20 0918)  sulfamethoxazole-trimethoprim (BACTRIM DS) 800-160 MG per tablet 1 tablet (1 tablet Oral Given 12/21/20 0916)  ibuprofen (ADVIL) tablet 800 mg (800 mg Oral Given 12/21/20 3614)    ED Course  I have reviewed the triage vital signs and the nursing notes.  Pertinent labs & imaging results that were available during my care of the patient were reviewed by me and considered in my medical decision making (see chart for details).    MDM Rules/Calculators/A&P                          Patient presents as outlined with a facial abscess.  Per procedure note small incision made to drain purulent material.  Patient is placed on Bactrim and ibuprofen.  Detailed instructions for home management included in discharge instructions.  Return precautions reviewed.  Follow-up plan reviewed. Final Clinical Impression(s) / ED Diagnoses Final diagnoses:  Cutaneous abscess of face    Rx / DC Orders ED Discharge Orders         Ordered    sulfamethoxazole-trimethoprim (BACTRIM DS)  800-160 MG tablet  2 times daily        12/21/20 0910    ibuprofen (ADVIL) 800 MG tablet  3 times daily        12/21/20 0910           Arby Barrette, MD 12/21/20 [phone]

## 2020-12-21 NOTE — ED Triage Notes (Signed)
Pt has a large inflamed abscess to the left cheek closer to the nose started getting worse a few days ago and c/o of severe pain

## 2020-12-21 NOTE — Discharge Instructions (Signed)
1.  Take Bactrim twice daily as prescribed.  Complete the prescription unless you are having significant adverse effects from the antibiotic such as rash or moderate to severe diarrhea and vomiting. 2.  Apply a very warm, moist compress over the abscess for about 20 minutes every hour for the next 24 hours.  Use a half hydrogen peroxide half water on a Q-tip to remove any thickening drainage or scab a couple times a day for the next 48 hours to promote drainage of the abscess.  Rinse your skin well with water after you have use the hydrogen peroxide solution.  Pat dry and apply mupirocin antibiotic ointment.  3.  You should have a recheck in the next 24 to 48 hours.  You can call Dr. Alan Ripper Dillingham's office as listed above.  She is a Engineer, petroleum and can also manage facial infections.  If you cannot be seen within the next 1 to 2 days, return to the emergency department for recheck.  Return to the emergency department if you are getting rapidly worsening redness, swelling, fever, chills, headache or other concerning symptoms.

## 2021-04-25 ENCOUNTER — Encounter (HOSPITAL_COMMUNITY): Payer: Self-pay | Admitting: Emergency Medicine

## 2021-04-25 ENCOUNTER — Emergency Department (HOSPITAL_COMMUNITY)
Admission: EM | Admit: 2021-04-25 | Discharge: 2021-04-25 | Disposition: A | Discharge status: Home / Self Care | Payer: No Typology Code available for payment source | Attending: Emergency Medicine | Admitting: Emergency Medicine

## 2021-04-25 ENCOUNTER — Emergency Department (HOSPITAL_COMMUNITY): Payer: No Typology Code available for payment source

## 2021-04-25 DIAGNOSIS — Y92410 Unspecified street and highway as the place of occurrence of the external cause: Secondary | ICD-10-CM | POA: Insufficient documentation

## 2021-04-25 DIAGNOSIS — M25561 Pain in right knee: Secondary | ICD-10-CM

## 2021-04-25 DIAGNOSIS — Z87891 Personal history of nicotine dependence: Secondary | ICD-10-CM | POA: Diagnosis not present

## 2021-04-25 DIAGNOSIS — M25512 Pain in left shoulder: Secondary | ICD-10-CM | POA: Diagnosis not present

## 2021-04-25 DIAGNOSIS — M542 Cervicalgia: Secondary | ICD-10-CM | POA: Insufficient documentation

## 2021-04-25 MED ORDER — METHOCARBAMOL 500 MG PO TABS: 500.0000 mg | ORAL_TABLET | Freq: Two times a day (BID) | ORAL | 0 refills | Status: DC

## 2021-04-25 MED ORDER — ACETAMINOPHEN 500 MG PO TABS: 1000.0000 mg | ORAL_TABLET | Freq: Once | ORAL | Status: DC

## 2021-04-25 NOTE — Discharge Instructions (Signed)
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
## 2021-05-20 ENCOUNTER — Ambulatory Visit (HOSPITAL_COMMUNITY)
Admission: EM | Admit: 2021-05-20 | Discharge: 2021-05-20 | Disposition: A | Discharge status: Home / Self Care | Payer: Medicaid Other | Attending: Physician Assistant | Admitting: Physician Assistant

## 2021-05-20 ENCOUNTER — Encounter (HOSPITAL_COMMUNITY): Payer: Self-pay | Admitting: *Deleted

## 2021-05-20 ENCOUNTER — Other Ambulatory Visit: Payer: Self-pay

## 2021-05-20 DIAGNOSIS — R1030 Lower abdominal pain, unspecified: Secondary | ICD-10-CM

## 2021-05-20 DIAGNOSIS — R3915 Urgency of urination: Secondary | ICD-10-CM

## 2021-05-20 DIAGNOSIS — N390 Urinary tract infection, site not specified: Secondary | ICD-10-CM

## 2021-05-20 DIAGNOSIS — R350 Frequency of micturition: Secondary | ICD-10-CM | POA: Insufficient documentation

## 2021-05-20 LAB — POCT URINALYSIS DIPSTICK, ED / UC
Bilirubin Urine: NEGATIVE
Glucose, UA: NEGATIVE mg/dL
Hgb urine dipstick: NEGATIVE
Ketones, ur: NEGATIVE mg/dL
Nitrite: POSITIVE — AB
Protein, ur: 30 mg/dL — AB
Specific Gravity, Urine: 1.025 (ref 1.005–1.030)
Urobilinogen, UA: 1 mg/dL (ref 0.0–1.0)
pH: 6 (ref 5.0–8.0)

## 2021-05-20 LAB — POC URINE PREG, ED: Preg Test, Ur: NEGATIVE

## 2021-05-20 MED ORDER — NITROFURANTOIN MONOHYD MACRO 100 MG PO CAPS: 100.0000 mg | ORAL_CAPSULE | Freq: Two times a day (BID) | ORAL | 0 refills | Status: DC

## 2021-05-20 MED ORDER — GABAPENTIN 300 MG PO CAPS: 300.0000 mg | ORAL_CAPSULE | Freq: Every day | ORAL | 0 refills | Status: DC

## 2021-05-20 NOTE — ED Triage Notes (Signed)
Pt reports intermittent low abd pain onset 2 days ago with urinary urgency.  Denies dysuria or polyuria.  When notified pt would need to provide urine sample, stated she gave one at her dr's office 2 days ago and has already been told she has a UTI, but no Rx was sent in.  Informed pt we would still need another urine sample today.  Denies fevers, n/v.  Denies vaginal discharge.

## 2021-05-20 NOTE — ED Provider Notes (Signed)
[facility]    CSN: [id number] Arrival date & time: 05/20/21  1008      History   Chief Complaint Chief Complaint  Patient presents with   Abdominal Pain    HPI Marcia Jackson is a 30 y.o. female.   Patient presents today with a 2-day history of UTI symptoms.  She reports lower abdominal pain, pelvic pressure, urinary frequency, urinary urgency.  She denies any dysuria, hematuria, pelvic pain, vaginal discharge, fever, nausea, vomiting.  She has tried over-the-counter medications without improvement of symptoms.  She does report a history of UTI in the past and states current symptoms are similar to previous episodes of this condition.  She denies any recent antibiotic use.  She has not seen a urologist in the past.  Denies history of nephrolithiasis.  Denies recent urogenital procedure or catheterization.   Past Medical History:  Diagnosis Date   Depression     Patient Active Problem List   Diagnosis Date Noted   Encounter for other contraceptive management 02/03/2017   Rh negative, antepartum 05/17/2016   Rubella non-immune status, antepartum 05/17/2016   Family history of congenital heart defect 05/07/2016    Past Surgical History:  Procedure Laterality Date   NO PAST SURGERIES     wisom teeth      OB History     Gravida  2   Para  2   Term  2   Preterm      AB      Living  2      SAB      IAB      Ectopic      Multiple  0   Live Births  2            Home Medications    Prior to Admission medications   Medication Sig Start Date End Date Taking? Authorizing Provider  nitrofurantoin, macrocrystal-monohydrate, (MACROBID) 100 MG capsule Take 1 capsule (100 mg total) by mouth 2 (two) times daily. 05/20/21  Yes Zael Shuman K, PA-C  gabapentin (NEURONTIN) 300 MG capsule Take 1 capsule (300 mg total) by mouth at bedtime. 05/20/21   Tyeisha Dinan, Noberto Retort, PA-C  famotidine (PEPCID) 20 MG tablet Take 1 tablet (20 mg total) by mouth 2 (two)  times daily. Patient not taking: Reported on 05/12/2019 04/28/19 05/19/20  Renne Crigler, PA-C  potassium chloride SA (K-DUR) 20 MEQ tablet Take 1 tablet (20 mEq total) by mouth 2 (two) times daily for 4 days. Patient not taking: Reported on 05/12/2019 04/22/19 05/19/20  Pricilla Loveless, MD  promethazine (PHENERGAN) 25 MG tablet Take 1 tablet (25 mg total) by mouth every 6 (six) hours as needed for nausea or vomiting. Patient not taking: Reported on 05/12/2019 03/22/19 05/19/20  Derwood Kaplan, MD    Family History Family History  Problem Relation Age of Onset   Diabetes Maternal Aunt    Cancer Maternal Grandmother    Diabetes Maternal Grandmother    Healthy Mother    Healthy Father     Social History Social History   Tobacco Use   Smoking status: Former    Packs/day: 0.15    Types: Cigarettes   Smokeless tobacco: Never  Vaping Use   Vaping Use: Some days  Substance Use Topics   Alcohol use: Yes    Comment: 3x/wk   Drug use: No    Comment: quit when found out pregnant      Allergies   Patient has no known allergies.  Review of Systems Review of Systems  Constitutional:  Positive for activity change. Negative for appetite change, fatigue and fever.  Respiratory:  Negative for cough and shortness of breath.   Cardiovascular:  Negative for chest pain.  Gastrointestinal:  Positive for abdominal pain. Negative for diarrhea, nausea and vomiting.  Genitourinary:  Positive for frequency and urgency. Negative for dysuria, flank pain, vaginal bleeding, vaginal discharge and vaginal pain.  Musculoskeletal:  Negative for arthralgias, back pain and myalgias.  Neurological:  Negative for dizziness, light-headedness and headaches.    Physical Exam Triage Vital Signs ED Triage Vitals  Enc Vitals Group     BP 05/20/21 1034 (!) 130/92     Pulse Rate 05/20/21 1034 90     Resp 05/20/21 1034 14     Temp 05/20/21 1034 98.8 F (37.1 C)     Temp Source 05/20/21 1034 Oral     SpO2  05/20/21 1034 98 %     Weight --      Height --      Head Circumference --      Peak Flow --      Pain Score 05/20/21 1036 0     Pain Loc --      Pain Edu? --      Excl. in GC? --    No data found.  Updated Vital Signs BP (!) 130/92   Pulse 90   Temp 98.8 F (37.1 C) (Oral)   Resp 14   SpO2 98%   Visual Acuity Right Eye Distance:   Left Eye Distance:   Bilateral Distance:    Right Eye Near:   Left Eye Near:    Bilateral Near:     Physical Exam Vitals reviewed.  Constitutional:      General: She is awake. She is not in acute distress.    Appearance: Normal appearance. She is well-developed. She is not ill-appearing.     Comments: Very pleasant female appears stated age in no acute distress sitting comfortably in exam room  HENT:     Head: Normocephalic and atraumatic.     Mouth/Throat:     Pharynx: Uvula midline. No oropharyngeal exudate or posterior oropharyngeal erythema.  Cardiovascular:     Rate and Rhythm: Normal rate and regular rhythm.     Heart sounds: Normal heart sounds, S1 normal and S2 normal. No murmur heard. Pulmonary:     Effort: Pulmonary effort is normal.     Breath sounds: Normal breath sounds. No wheezing, rhonchi or rales.     Comments: Clear to auscultation bilaterally Abdominal:     General: Bowel sounds are normal.     Palpations: Abdomen is soft.     Tenderness: There is abdominal tenderness in the suprapubic area. There is no right CVA tenderness, left CVA tenderness, guarding or rebound.     Comments: Mild tenderness palpation in suprapubic region.  No evidence of acute abdomen on physical exam.  No CVA tenderness.  Genitourinary:    Comments: Exam deferred Musculoskeletal:     Cervical back: Normal range of motion and neck supple.  Psychiatric:        Behavior: Behavior is cooperative.     UC Treatments / Results  Labs (all labs ordered are listed, but only abnormal results are displayed) Labs Reviewed  POCT URINALYSIS  DIPSTICK, ED / UC - Abnormal; Notable for the following components:      Result Value   Protein, ur 30 (*)    Nitrite POSITIVE (*)  Leukocytes,Ua LARGE (*)    All other components within normal limits  URINE CULTURE  POC URINE PREG, ED    EKG   Radiology No results found.  Procedures Procedures (including critical care time)  Medications Ordered in UC Medications - No data to display  Initial Impression / Assessment and Plan / UC Course  I have reviewed the triage vital signs and the nursing notes.  Pertinent labs & imaging results that were available during my care of the patient were reviewed by me and considered in my medical decision making (see chart for details).     UA consistent with UTI.  Urine pregnancy was negative in clinic today.  We will start patient on Macrobid twice daily for 5 days.  Urine culture was obtained and we discussed potential need to change antibiotics based on susceptibilities identified on culture.  Recommend that she rest and drink plenty of fluid.  Discussed alarm symptoms that warrant emergent evaluation.  Strict return precautions given to which she expressed understanding.  At the end of visit, patient requested a refill of gabapentin.  Her last refill was given by our clinic several months ago.  She does not currently have a PCP and has not run out of this medication.  2 weeks sent per her request with instruction that she would need to either return to our clinic or see her primary care provider for additional refills.  Final Clinical Impressions(s) / UC Diagnoses   Final diagnoses:  Lower urinary tract infectious disease  Urinary frequency  Urinary urgency  Lower abdominal pain     Discharge Instructions      I believe that you have a urinary tract infection.  We are starting you on an antibiotic that you should take twice a day for 5 days.  We are sending her urine off for culture and if we need to change antibiotic we will  contact you.  Make sure that you rest and drink plenty of fluid.  If you have any worsening symptoms including persistent symptoms, back pain, fever, nausea, vomiting you need to be reevaluated immediately as we discussed.     ED Prescriptions     Medication Sig Dispense Auth. Provider   nitrofurantoin, macrocrystal-monohydrate, (MACROBID) 100 MG capsule Take 1 capsule (100 mg total) by mouth 2 (two) times daily. 10 capsule Siomara Burkel K, PA-C   gabapentin (NEURONTIN) 300 MG capsule Take 1 capsule (300 mg total) by mouth at bedtime. 30 capsule Wendle Kina K, PA-C      PDMP not reviewed this encounter.   Jeani Hawking, PA-C 05/20/21 1101

## 2021-05-20 NOTE — ED Notes (Signed)
Pt called from waiting area without response. 

## 2021-05-20 NOTE — Discharge Instructions (Signed)
I believe that you have a urinary tract infection.  We are starting you on an antibiotic that you should take twice a day for 5 days.  We are sending her urine off for culture and if we need to change antibiotic we will contact you.  Make sure that you rest and drink plenty of fluid.  If you have any worsening symptoms including persistent symptoms, back pain, fever, nausea, vomiting you need to be reevaluated immediately as we discussed.

## 2021-05-22 LAB — URINE CULTURE: Culture: >=100000 — AB

## 2021-07-16 ENCOUNTER — Ambulatory Visit (INDEPENDENT_AMBULATORY_CARE_PROVIDER_SITE_OTHER): Payer: Self-pay

## 2021-07-16 ENCOUNTER — Other Ambulatory Visit: Payer: Self-pay

## 2021-07-16 ENCOUNTER — Ambulatory Visit (HOSPITAL_COMMUNITY)
Admission: EM | Admit: 2021-07-16 | Discharge: 2021-07-16 | Disposition: A | Discharge status: Home / Self Care | Payer: Self-pay | Attending: Emergency Medicine | Admitting: Emergency Medicine

## 2021-07-16 ENCOUNTER — Encounter (HOSPITAL_COMMUNITY): Payer: Self-pay

## 2021-07-16 DIAGNOSIS — W19XXXA Unspecified fall, initial encounter: Secondary | ICD-10-CM

## 2021-07-16 DIAGNOSIS — M25561 Pain in right knee: Secondary | ICD-10-CM

## 2021-07-16 MED ORDER — CYCLOBENZAPRINE HCL 10 MG PO TABS: 10.0000 mg | ORAL_TABLET | Freq: Every day | ORAL | 0 refills | Status: DC

## 2021-07-16 MED ORDER — NAPROXEN 500 MG PO TABS: 500.0000 mg | ORAL_TABLET | Freq: Two times a day (BID) | ORAL | 0 refills | Status: DC

## 2021-07-16 MED ORDER — PREDNISONE 20 MG PO TABS: 40.0000 mg | ORAL_TABLET | Freq: Every day | ORAL | 0 refills | Status: DC
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
## 2021-07-16 NOTE — Discharge Instructions (Addendum)
Your pain is most likely caused by irritation to the muscles or ligaments.   Your x-ray was negative  Starting today begin taking prednisone every morning with food for the next 5 days, while using this medication if you need additional comfort throughout the day please use Tylenol can take 650 mg to 1000 mg every 6 hours as needed  After completion of steroid course should begin using naproxen twice a day as needed  May use muscle relaxer at bedtime for additional comfort, do not feel this medication may make you drowsy.  You may use heating pad in 15 minute intervals as needed for additional comfort  Begin stretching affected area daily for 10 minutes as tolerated to further loosen muscles   When lying down place pillow underneath and between knees for support  If pain persist after recommended treatment or reoccurs if may be beneficial to follow up with orthopedic specialist for evaluation, this doctor specializes in the bones and can manage your symptoms long-term with options such as but not limited to imaging, medications or physical therapy
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
## 2021-10-09 ENCOUNTER — Ambulatory Visit (HOSPITAL_COMMUNITY)
Admission: EM | Admit: 2021-10-09 | Discharge: 2021-10-09 | Disposition: A | Discharge status: Home / Self Care | Payer: PRIVATE HEALTH INSURANCE | Attending: Emergency Medicine | Admitting: Emergency Medicine

## 2021-10-09 ENCOUNTER — Other Ambulatory Visit: Payer: Self-pay

## 2021-10-09 ENCOUNTER — Encounter (HOSPITAL_COMMUNITY): Payer: Self-pay | Admitting: Emergency Medicine

## 2021-10-09 DIAGNOSIS — R350 Frequency of micturition: Secondary | ICD-10-CM | POA: Insufficient documentation

## 2021-10-09 LAB — POCT URINALYSIS DIPSTICK, ED / UC
Glucose, UA: NEGATIVE mg/dL
Nitrite: NEGATIVE
Protein, ur: 100 mg/dL — AB
Specific Gravity, Urine: 1.015 (ref 1.005–1.030)
Urobilinogen, UA: >=8 mg/dL (ref 0.0–1.0)
pH: 8.5 — ABNORMAL HIGH (ref 5.0–8.0)

## 2021-10-09 MED ORDER — NITROFURANTOIN MONOHYD MACRO 100 MG PO CAPS: 100.0000 mg | ORAL_CAPSULE | Freq: Two times a day (BID) | ORAL | 0 refills | Status: DC

## 2021-10-09 NOTE — ED Triage Notes (Signed)
Pt reports urinary frequency and urgency x 4 days. Pt states she has a hx of UTIs.

## 2021-10-09 NOTE — Discharge Instructions (Signed)
Your urinalysis was positive for Marcia Jackson blood cells but did not have nitrates which is an enzyme released by bacteria in the urine therefore your urine has been sent to the lab to see if it will grow bacteria and, if this occurs you will be notified if any medications need to be changed  Based on your history of urinary infections I will go ahead and start you on medicine  Take Macrobid twice a day for the next 5 days  A vaginal swab has been obtained to check for yeast and bacterial vaginosis, if positive you will be notified about this is obtained at that time  If symptoms continue to persist past use of all medication you may return to urgent care for reevaluation

## 2021-10-09 NOTE — ED Provider Notes (Signed)
[place]    CSN: [id number] Arrival date & time: 10/09/21  1327      History   Chief Complaint Chief Complaint  Patient presents with   Urinary Frequency    HPI Marcia Jackson is a 31 y.o. female.   Patient presents with urinary frequency, urgency and lower abdominal pressure for present with urination for 4 days.  Has attempted use of Azo and Cystex which has been helpful.  Denies dysuria, hematuria, flank pain, vaginal discharge, itching, odor.  History of UTIs.   Past Medical History:  Diagnosis Date   Depression     Patient Active Problem List   Diagnosis Date Noted   Encounter for other contraceptive management 02/03/2017   Rh negative, antepartum 05/17/2016   Rubella non-immune status, antepartum 05/17/2016   Family history of congenital heart defect 05/07/2016    Past Surgical History:  Procedure Laterality Date   NO PAST SURGERIES     wisom teeth      OB History     Gravida  2   Para  2   Term  2   Preterm      AB      Living  2      SAB      IAB      Ectopic      Multiple  0   Live Births  2            Home Medications    Prior to Admission medications   Medication Sig Start Date End Date Taking? Authorizing Provider  cyclobenzaprine (FLEXERIL) 10 MG tablet Take 1 tablet (10 mg total) by mouth at bedtime. 07/16/21   Sinead Hockman, Leitha Schuller, NP  gabapentin (NEURONTIN) 300 MG capsule Take 1 capsule (300 mg total) by mouth at bedtime. 05/20/21   Raspet, Derry Skill, PA-C  naproxen (NAPROSYN) 500 MG tablet Take 1 tablet (500 mg total) by mouth 2 (two) times daily. 07/16/21   Ambree Frances, Leitha Schuller, NP  predniSONE (DELTASONE) 20 MG tablet Take 2 tablets (40 mg total) by mouth daily. 07/16/21   Hans Eden, NP  famotidine (PEPCID) 20 MG tablet Take 1 tablet (20 mg total) by mouth 2 (two) times daily. Patient not taking: Reported on 05/12/2019 04/28/19 05/19/20  Carlisle Cater, PA-C  potassium chloride SA (K-DUR) 20 MEQ tablet Take 1  tablet (20 mEq total) by mouth 2 (two) times daily for 4 days. Patient not taking: Reported on 05/12/2019 04/22/19 05/19/20  Sherwood Gambler, MD  promethazine (PHENERGAN) 25 MG tablet Take 1 tablet (25 mg total) by mouth every 6 (six) hours as needed for nausea or vomiting. Patient not taking: Reported on 05/12/2019 03/22/19 05/19/20  Varney Biles, MD    Family History Family History  Problem Relation Age of Onset   Diabetes Maternal Aunt    Cancer Maternal Grandmother    Diabetes Maternal Grandmother    Healthy Mother    Healthy Father     Social History Social History   Tobacco Use   Smoking status: Former    Packs/day: 0.15    Types: Cigarettes   Smokeless tobacco: Never  Vaping Use   Vaping Use: Some days  Substance Use Topics   Alcohol use: Yes    Comment: 3x/wk   Drug use: No    Comment: quit when found out pregnant      Allergies   Patient has no known allergies.   Review of Systems Review of Systems  Respiratory: Negative.  Cardiovascular: Negative.   Gastrointestinal:  Positive for abdominal pain. Negative for abdominal distention, anal bleeding, blood in stool, constipation, diarrhea, nausea, rectal pain and vomiting.  Genitourinary:  Positive for frequency and urgency. Negative for decreased urine volume, difficulty urinating, dyspareunia, dysuria, enuresis, flank pain, genital sores, hematuria, menstrual problem, pelvic pain, vaginal bleeding, vaginal discharge and vaginal pain.  Musculoskeletal: Negative.   Skin: Negative.   Neurological: Negative.     Physical Exam Triage Vital Signs ED Triage Vitals [10/09/21 1440]  Enc Vitals Group     BP (!) 140/97     Pulse Rate 87     Resp 16     Temp 98.5 F (36.9 C)     Temp Source Oral     SpO2 100 %     Weight 113 lb 1.5 oz (51.3 kg)     Height 5\' 2"  (1.575 m)     Head Circumference      Peak Flow      Pain Score 0     Pain Loc      Pain Edu?      Excl. in [place]?    No data found.  Updated  Vital Signs BP (!) 140/97 (BP Location: Left Arm)    Pulse 87    Temp 98.5 F (36.9 C) (Oral)    Resp 16    Ht 5\' 2"  (1.575 m)    Wt 113 lb 1.5 oz (51.3 kg)    SpO2 100%    BMI 20.69 kg/m   Visual Acuity Right Eye Distance:   Left Eye Distance:   Bilateral Distance:    Right Eye Near:   Left Eye Near:    Bilateral Near:     Physical Exam Constitutional:      Appearance: Normal appearance.  HENT:     Head: Normocephalic.  Eyes:     Extraocular Movements: Extraocular movements intact.  Pulmonary:     Effort: Pulmonary effort is normal.  Abdominal:     General: Abdomen is flat. Bowel sounds are normal. There is no distension.     Palpations: Abdomen is soft.     Tenderness: There is no abdominal tenderness. There is no right CVA tenderness or left CVA tenderness.  Skin:    General: Skin is warm and dry.  Neurological:     Mental Status: She is alert and oriented to person, place, and time. Mental status is at baseline.  Psychiatric:        Mood and Affect: Mood normal.        Behavior: Behavior normal.     UC Treatments / Results  Labs (all labs ordered are listed, but only abnormal results are displayed) Labs Reviewed  POCT URINALYSIS DIPSTICK, ED / UC - Abnormal; Notable for the following components:      Result Value   Bilirubin Urine SMALL (*)    Ketones, ur TRACE (*)    Hgb urine dipstick TRACE (*)    pH 8.5 (*)    Protein, ur 100 (*)    Leukocytes,Ua MODERATE (*)    All other components within normal limits    EKG   Radiology No results found.  Procedures Procedures (including critical care time)  Medications Ordered in UC Medications - No data to display  Initial Impression / Assessment and Plan / UC Course  I have reviewed the triage vital signs and the nursing notes.  Pertinent labs & imaging results that were available during my care of the patient were  reviewed by me and considered in my medical decision making (see chart for  details).  Urinary frequency  Urinalysis positive for Aison Malveaux blood cells but negative for nitrates, sent for culture, discussed findings with patient, Aptima swab collected for evaluation of BV and yeast, will treat per protocol, will start patient on medication based on symptomology and history, Macrobid 5-day course prescribed as she has had E. coli present in the urine in the past but denies decreased water intake as well as good feminine hygiene.  In addition, may follow-up with urgent care as needed for persisting symptoms Final Clinical Impressions(s) / UC Diagnoses   Final diagnoses:  None   Discharge Instructions   None    ED Prescriptions   None    PDMP not reviewed this encounter.   Hans Eden, [place] 10/09/21 [phone]

## 2021-10-10 ENCOUNTER — Telehealth (HOSPITAL_COMMUNITY): Payer: Self-pay | Admitting: Emergency Medicine

## 2021-10-10 LAB — CERVICOVAGINAL ANCILLARY ONLY
Bacterial Vaginitis (gardnerella): POSITIVE — AB
Candida Glabrata: NEGATIVE
Candida Vaginitis: NEGATIVE
Comment: NEGATIVE
Comment: NEGATIVE
Comment: NEGATIVE

## 2021-10-10 LAB — URINE CULTURE

## 2021-10-10 MED ORDER — METRONIDAZOLE 500 MG PO TABS: 500.0000 mg | ORAL_TABLET | Freq: Two times a day (BID) | ORAL | 0 refills | Status: DC

## 2022-02-11 ENCOUNTER — Inpatient Hospital Stay (HOSPITAL_COMMUNITY)
Admission: RE | Admit: 2022-02-11 | Discharge: 2022-02-11 | Disposition: A | Discharge status: Home / Self Care | Payer: Self-pay | Source: Ambulatory Visit

## 2022-02-11 ENCOUNTER — Encounter (HOSPITAL_COMMUNITY): Payer: Self-pay | Admitting: *Deleted

## 2022-02-11 ENCOUNTER — Ambulatory Visit (HOSPITAL_COMMUNITY)
Admission: EM | Admit: 2022-02-11 | Discharge: 2022-02-11 | Disposition: A | Discharge status: Home / Self Care | Payer: PRIVATE HEALTH INSURANCE | Attending: Internal Medicine | Admitting: Internal Medicine

## 2022-02-11 DIAGNOSIS — H938X2 Other specified disorders of left ear: Secondary | ICD-10-CM | POA: Diagnosis not present

## 2022-02-11 DIAGNOSIS — H6123 Impacted cerumen, bilateral: Secondary | ICD-10-CM | POA: Diagnosis not present

## 2022-02-11 HISTORY — DX: Personal history of other specified conditions: Z87.898

## 2022-02-11 MED ORDER — CARBAMIDE PEROXIDE 6.5 % OT SOLN: 5.0000 [drp] | Freq: Two times a day (BID) | OTIC | 0 refills | Status: DC

## 2022-02-11 NOTE — ED Notes (Signed)
Discussed measures to take to help decrease HTN -- low sodium diet, avoiding processed foods. Recommended pt keep track of BP, and if it remains consistently elevated, to seek PCP. Pt verbalized understanding.

## 2022-02-11 NOTE — ED Provider Notes (Signed)
[facility]    CSN: [id number] Arrival date & time: 02/11/22  1127      History   Chief Complaint Chief Complaint  Patient presents with   Appointment    1130   Ear Fullness    HPI Marcia Jackson is a 31 y.o. female.   Patient presents urgent care for evaluation of left ear "fullness".  States that hearing is muffled to the left ear and she was able to scratch her ear with her left fingernail and remove some earwax.  She first noticed ear fullness 4 days ago.  She has not attempted use of any over-the-counter medications or eardrops to help with her symptoms.  She denies use of Q-tips or any other earwax removing devices to the ears.  Right ear hearing is normal.  Denies tinnitus and ear pain.  No other aggravating or relieving factors for symptoms identified at this time.  No fever/chills, sore throat, nasal congestion, headache, dizziness, or neck pain.   Ear Fullness    Past Medical History:  Diagnosis Date   Depression    History of heavy alcohol consumption     Patient Active Problem List   Diagnosis Date Noted   Encounter for other contraceptive management 02/03/2017   Rh negative, antepartum 05/17/2016   Rubella non-immune status, antepartum 05/17/2016   Family history of congenital heart defect 05/07/2016    Past Surgical History:  Procedure Laterality Date   WISDOM TOOTH EXTRACTION      OB History     Gravida  2   Para  2   Term  2   Preterm      AB      Living  2      SAB      IAB      Ectopic      Multiple  0   Live Births  2            Home Medications    Prior to Admission medications   Medication Sig Start Date End Date Taking? Authorizing Provider  carbamide peroxide (DEBROX) 6.5 % OTIC solution Place 5 drops into both ears 2 (two) times daily. 02/11/22  Yes Carlisle Beers, FNP  gabapentin (NEURONTIN) 300 MG capsule Take 1 capsule (300 mg total) by mouth at bedtime. 05/20/21  Yes Raspet, Erin K, PA-C   metroNIDAZOLE (FLAGYL) 500 MG tablet Take 1 tablet (500 mg total) by mouth 2 (two) times daily. 10/10/21   Lamptey, Britta Mccreedy, MD  cyclobenzaprine (FLEXERIL) 10 MG tablet Take 1 tablet (10 mg total) by mouth at bedtime. 07/16/21   White, Elita Boone, NP  naproxen (NAPROSYN) 500 MG tablet Take 1 tablet (500 mg total) by mouth 2 (two) times daily. 07/16/21   White, Elita Boone, NP  nitrofurantoin, macrocrystal-monohydrate, (MACROBID) 100 MG capsule Take 1 capsule (100 mg total) by mouth 2 (two) times daily. 10/09/21   White, Elita Boone, NP  predniSONE (DELTASONE) 20 MG tablet Take 2 tablets (40 mg total) by mouth daily. 07/16/21   Valinda Hoar, NP  famotidine (PEPCID) 20 MG tablet Take 1 tablet (20 mg total) by mouth 2 (two) times daily. Patient not taking: Reported on 05/12/2019 04/28/19 05/19/20  Renne Crigler, PA-C  potassium chloride SA (K-DUR) 20 MEQ tablet Take 1 tablet (20 mEq total) by mouth 2 (two) times daily for 4 days. Patient not taking: Reported on 05/12/2019 04/22/19 05/19/20  Pricilla Loveless, MD  promethazine (PHENERGAN) 25 MG tablet Take 1 tablet (  25 mg total) by mouth every 6 (six) hours as needed for nausea or vomiting. Patient not taking: Reported on 05/12/2019 03/22/19 05/19/20  Derwood Kaplan, MD    Family History Family History  Problem Relation Age of Onset   Diabetes Maternal Aunt    Cancer Maternal Grandmother    Diabetes Maternal Grandmother    Healthy Mother    Healthy Father     Social History Social History   Tobacco Use   Smoking status: Former    Packs/day: 0.15    Types: Cigarettes   Smokeless tobacco: Never  Vaping Use   Vaping Use: Some days  Substance Use Topics   Alcohol use: Yes    Alcohol/week: 4.0 standard drinks of alcohol    Types: 4 Standard drinks or equivalent per week    Comment: No longer drinks straight liquor per pt   Drug use: No     Allergies   Patient has no known allergies.   Review of Systems Review of Systems Per  HPI  Physical Exam Triage Vital Signs ED Triage Vitals  Enc Vitals Group     BP 02/11/22 1156 (!) 131/91     Pulse Rate 02/11/22 1156 69     Resp 02/11/22 1156 16     Temp 02/11/22 1156 98.6 F (37 C)     Temp Source 02/11/22 1156 Oral     SpO2 02/11/22 1156 100 %     Weight --      Height --      Head Circumference --      Peak Flow --      Pain Score 02/11/22 1157 0     Pain Loc --      Pain Edu? --      Excl. in [place]? --    No data found.  Updated Vital Signs BP (!) 131/91   Pulse 69   Temp 98.6 F (37 C) (Oral)   Resp 16   SpO2 100%   Visual Acuity Right Eye Distance:   Left Eye Distance:   Bilateral Distance:    Right Eye Near:   Left Eye Near:    Bilateral Near:     Physical Exam Vitals and nursing note reviewed.  Constitutional:      Appearance: Normal appearance. She is not ill-appearing or toxic-appearing.     Comments: Very pleasant patient sitting on exam in position of comfort table in no acute distress.   HENT:     Head: Normocephalic and atraumatic.     Right Ear: Hearing and external ear normal. There is impacted cerumen.     Left Ear: Hearing and external ear normal. There is impacted cerumen.     Nose: Nose normal.     Mouth/Throat:     Lips: Pink.     Mouth: Mucous membranes are moist.  Eyes:     General: Lids are normal. Vision grossly intact. Gaze aligned appropriately.     Extraocular Movements: Extraocular movements intact.     Conjunctiva/sclera: Conjunctivae normal.  Cardiovascular:     Rate and Rhythm: Normal rate and regular rhythm.     Heart sounds: Normal heart sounds, S1 normal and S2 normal.  Pulmonary:     Effort: Pulmonary effort is normal. No respiratory distress.     Breath sounds: Normal breath sounds and air entry.  Abdominal:     General: Bowel sounds are normal.     Palpations: Abdomen is soft.     Tenderness: There is no  abdominal tenderness. There is no right CVA tenderness, left CVA tenderness or guarding.   Musculoskeletal:     Cervical back: Neck supple.  Skin:    General: Skin is warm and dry.     Capillary Refill: Capillary refill takes less than 2 seconds.     Findings: No rash.  Neurological:     General: No focal deficit present.     Mental Status: She is alert and oriented to person, place, and time. Mental status is at baseline.     Cranial Nerves: No dysarthria or facial asymmetry.     Gait: Gait is intact.  Psychiatric:        Mood and Affect: Mood normal.        Speech: Speech normal.        Behavior: Behavior normal.        Thought Content: Thought content normal.        Judgment: Judgment normal.      UC Treatments / Results  Labs (all labs ordered are listed, but only abnormal results are displayed) Labs Reviewed - No data to display  EKG   Radiology No results found.  Procedures Procedures (including critical care time)  Medications Ordered in UC Medications - No data to display  Initial Impression / Assessment and Plan / UC Course  I have reviewed the triage vital signs and the nursing notes.  Pertinent labs & imaging results that were available during my care of the patient were reviewed by me and considered in my medical decision making (see chart for details).  1.  Impacted cerumen of both ears Cerumen disimpaction completed with ear lavage bilaterally.  Patient reports improved hearing after irrigation of both ears.  Bilateral tympanic membranes appear normal and your canals are without wax.  Ear canals are slightly red, but this is likely related to the removal of earwax.  Debrox eardrops prescribed for future use as needed for cerumen impactions.  Patient instructed to not place anything into her ears to attempt to remove wax as this only further pushes earwax into the ear canal causing obstruction.  Discussed physical exam and available lab work findings in clinic with patient.  Counseled patient regarding appropriate use of medications and  potential side effects for all medications recommended or prescribed today. Discussed red flag signs and symptoms of worsening condition,when to call the PCP office, return to urgent care, and when to seek higher level of care in the emergency department. Patient verbalizes understanding and agreement with plan. All questions answered. Patient discharged in stable condition.  Final Clinical Impressions(s) / UC Diagnoses   Final diagnoses:  Impacted cerumen of both ears  Sensation of fullness in left ear     Discharge Instructions      We removed wax from both of your ears at urgent care today. In the future, you may attempt use of Debrox eardrops to remove earwax. Do not insert Q-tips or anything else into the ears to remove wax as this only further pushes the wax into your ears.  Return to urgent care as needed.     ED Prescriptions     Medication Sig Dispense Auth. Provider   carbamide peroxide (DEBROX) 6.5 % OTIC solution Place 5 drops into both ears 2 (two) times daily. 15 mL Carlisle Beers, FNP      PDMP not reviewed this encounter.   Carlisle Beers, [place] 02/11/22 1335

## 2022-02-11 NOTE — Discharge Instructions (Signed)
We removed wax from both of your ears at urgent care today. In the future, you may attempt use of Debrox eardrops to remove earwax. Do not insert Q-tips or anything else into the ears to remove wax as this only further pushes the wax into your ears.  Return to urgent care as needed.

## 2022-02-11 NOTE — ED Triage Notes (Signed)
C/O pressure in left ear onset approx 4 days ago. Has used ear wax drops without relief. C/O tinnitus in left ear.

## 2022-05-29 ENCOUNTER — Ambulatory Visit: Payer: Self-pay | Admitting: Family Medicine

## 2022-05-29 ENCOUNTER — Ambulatory Visit: Payer: PRIVATE HEALTH INSURANCE | Attending: Family Medicine | Admitting: Family Medicine

## 2022-05-29 VITALS — BP 140/88 | HR 92 | Temp 98.8°F | Ht 62.0 in | Wt 114.0 lb

## 2022-05-29 DIAGNOSIS — G621 Alcoholic polyneuropathy: Secondary | ICD-10-CM | POA: Diagnosis not present

## 2022-05-29 DIAGNOSIS — Z1159 Encounter for screening for other viral diseases: Secondary | ICD-10-CM | POA: Diagnosis not present

## 2022-05-29 DIAGNOSIS — I10 Essential (primary) hypertension: Secondary | ICD-10-CM | POA: Insufficient documentation

## 2022-05-29 DIAGNOSIS — F419 Anxiety disorder, unspecified: Secondary | ICD-10-CM

## 2022-05-29 MED ORDER — HYDROXYZINE HCL 25 MG PO TABS: 25.0000 mg | ORAL_TABLET | Freq: Three times a day (TID) | ORAL | 1 refills | Status: DC | PRN

## 2022-05-29 MED ORDER — AMLODIPINE BESYLATE 2.5 MG PO TABS: 2.5000 mg | ORAL_TABLET | Freq: Every day | ORAL | 3 refills | Status: DC

## 2022-05-29 MED ORDER — NALTREXONE HCL 50 MG PO TABS: 50.0000 mg | ORAL_TABLET | Freq: Every day | ORAL | 3 refills | Status: DC

## 2022-05-29 MED ORDER — FOLIC ACID 1 MG PO TABS: 1.0000 mg | ORAL_TABLET | Freq: Every day | ORAL | 3 refills | Status: DC

## 2022-05-29 MED ORDER — THIAMINE HCL 100 MG PO TABS: 100.0000 mg | ORAL_TABLET | Freq: Every day | ORAL | 3 refills | Status: DC

## 2022-05-29 MED ORDER — GABAPENTIN 300 MG PO CAPS: 300.0000 mg | ORAL_CAPSULE | Freq: Every day | ORAL | 3 refills | Status: DC

## 2022-05-29 NOTE — Progress Notes (Unsigned)
Discuss Anxiety. Medication refill on gabapentin.

## 2022-05-29 NOTE — Patient Instructions (Signed)
Alcohol Misuse and Dependence Information, Adult Alcohol is a widely available drug and people choose to drink alcohol in different amounts. Alcohol misuse and dependence can have a negative effect on your life. Alcohol misuse is when you use alcohol too much or too often. You may have a hard time setting a limit on the amount you drink. Alcohol dependence is when you use alcohol consistently for a period of time, and your body changes as a result. Alcohol dependence can make it hard for you to stop drinking because you may start to feel sick or different when you do not drink alcohol. These symptoms are known as withdrawal. People who drink alcohol very often and in large amounts, may develop what is called an alcohol use disorder. How can alcohol misuse and dependence affect me? Drinking too much can lead to addiction. You may feel like you need alcohol to function normally. You may drink alcohol before work in the morning, during the day, or as soon as you get home from work in the evening. These actions can result in: Poor work performance. Job loss. Financial problems. Car crashes or criminal charges from driving after drinking alcohol. Problems in your relationships with friends and family. Losing the trust and respect of coworkers, friends, and family. Drinking heavily over a long period of time can permanently damage your body and brain, and can cause lifelong health issues, such as: Damage to your liver or pancreas. Heart problems, high blood pressure, or stroke. Certain cancers. Decreased ability to fight infections. Brain or nerve damage. Depression. Early death, also called premature death. If you are careless or you crave alcohol, it is easy to drink more than your body can handle (overdose). Alcohol overdose is a serious situation that requires hospitalization. It may lead to permanent injuries or death. What can increase my risk? Having a family history of alcohol misuse. Having  depression or other mental health conditions. Beginning to drink at an early age. Binge drinking often. Experiencing trauma, stress, and an unstable home life during childhood. Spending time with people who drink often. What actions can I take to prevent alcohol misuse and dependence? Do not drink alcohol if: Your health care provider tells you not to drink. You are pregnant, may be pregnant, or are planning to become pregnant. If you drink alcohol: Limit how much you have to: 0-1 drink a day for women who are not pregnant. 0-2 drinks a day for men. Know how much alcohol is in your drink. In the [place] one drink equals one 12 oz bottle of beer (355 mL), one 5 oz glass of wine (148 mL), or one 1 oz glass of hard liquor (44 mL). If you think you have an alcohol dependency problem, decide to stop drinking. This can be very hard to do if you are used to frequently drinking alcohol. If you begin to have withdrawal symptoms, talk with your health care provider or a person that you trust. These symptoms may include anxiety, shaky hands, headache, nausea, sweating, or not being able to sleep. Choose to drink nonalcoholic beverages in social gatherings and places where there may be alcohol. Activity Spend more time on activities that you enjoy that do not involve alcohol, like hobbies or exercise. Find healthy ways to cope with stress, such as meditation or spending time with people you care about. General information Talk to your family, coworkers, and friends about supporting you in your efforts to stop drinking. If they drink, ask them not to drink   around you. Spend more time with people who do not drink alcohol. If you think that you have an alcohol dependency problem: Tell friends or family about your concerns. Talk with your health care provider or another health professional about where to get help. Work with a therapist and a chemical dependency counselor. Consider joining a support group  for people who struggle with alcohol misuse and dependence. Where to find support  Your health care provider. SMART Recovery: smartrecovery.org Local treatment centers or chemical dependency counselors. Local AA groups in your community: aa.org Where to find more information Centers for Disease Control and Prevention: cdc.gov National Institute on Alcohol Abuse and Alcoholism: niaaa.nih.gov Alcoholics Anonymous (AA): aa.org Contact a health care provider if: You drank more or for longer than you intended on more than one occasion. You often drink to the point of vomiting or passing out. You have problems in your life due to drinking, but you continue to drink. You keep drinking even though you feel anxious, depressed, or have experienced memory loss. You have stopped doing the things you used to enjoy in order to drink. You have to drink more than you used to in order to get the effect you want. You experience anxiety, sweating, nausea, shakiness, and trouble sleeping when you try to stop drinking. Get help right away if: You have serious withdrawal symptoms, including: Confusion. Racing heart. High blood pressure. Fever. These symptoms may be an emergency. Get help right away. Call 911. Do not wait to see if the symptoms will go away. Do not drive yourself to the hospital. Also, get help right away if: You have thoughts about hurting yourself or others. Take one of these steps if you feel like you may hurt yourself or others, or have thoughts about taking your own life: Call 911. Call the National Suicide Prevention Lifeline at 1-800-273-8255 or 988. This is open 24 hours a day. Text the Crisis Text Line at 741741. Summary Alcohol misuse and dependence can have a negative effect on your life. Drinking too much or too often can lead to addiction. If you drink alcohol, limit how much you use. If you are having trouble keeping your drinking under control, find ways to change your  behavior. Hobbies, calming activities, exercise, or support groups can help. If you feel you need help with changing your drinking habits, talk with your health care provider, a good friend, or a therapist, or go to a support group. This information is not intended to replace advice given to you by your health care provider. Make sure you discuss any questions you have with your health care provider. Document Revised: 10/03/2021 Document Reviewed: 10/03/2021 Elsevier Patient Education  2023 Elsevier Inc.  

## 2022-05-29 NOTE — Progress Notes (Signed)
Subjective:  Patient ID: Marcia Jackson, female    DOB: 12/13/1990  Age: 31 y.o. MRN: [id number]  CC: New Patient (Initial Visit)   HPI Marcia Jackson is a 31 y.o. year old female with a history of alcoholic peripheral neuropathy who presents today to establish care.  Interval History:  She has been on Gabapentin for neuropathy as she has had tingling in her hands and feet. Drinks 3 cans of alcohol daily 4 days a week of hard [employer] Lemonade. Was previously followed by neurology, Dr. Posey Pronto but was no longer able to follow-up due to co-pays.  She finds she gets anxious easily when something happens and she thinks of something dreadful and this occurs about once every 2 weeks . She starts panicking but does not have dyspnea or chest pains.  Denies presence of depression.  She does not have to deal with anxiety daily on a regular basis but just sporadically. Past Medical History:  Diagnosis Date   Depression    History of heavy alcohol consumption     Past Surgical History:  Procedure Laterality Date   WISDOM TOOTH EXTRACTION      Family History  Problem Relation Age of Onset   Diabetes Maternal Aunt    Cancer Maternal Grandmother    Diabetes Maternal Grandmother    Healthy Mother    Healthy Father     Social History   Socioeconomic History   Marital status: Single    Spouse name: Not on file   Number of children: 2   Years of education: 10   Highest education level: Not on file  Occupational History   Occupation: [employer]  Tobacco Use   Smoking status: Former    Packs/day: 0.15    Types: Cigarettes   Smokeless tobacco: Never  Vaping Use   Vaping Use: Some days  Substance and Sexual Activity   Alcohol use: Yes    Alcohol/week: 4.0 standard drinks of alcohol    Types: 4 Standard drinks or equivalent per week    Comment: No longer drinks straight liquor per pt   Drug use: No   Sexual activity: Yes    Birth control/protection: Implant  Other Topics Concern    Not on file  Social History Narrative   2 children   Two story house   Works at [employer]   Right handed   Social Determinants of Health   Financial Resource Strain: Not on file  Food Insecurity: Not on file  Transportation Needs: Not on file  Physical Activity: Not on file  Stress: Not on file  Social Connections: Not on file    No Known Allergies  Outpatient Medications Prior to Visit  Medication Sig Dispense Refill   carbamide peroxide (DEBROX) 6.5 % OTIC solution Place 5 drops into both ears 2 (two) times daily. (Patient not taking: Reported on 05/29/2022) 15 mL 0   cyclobenzaprine (FLEXERIL) 10 MG tablet Take 1 tablet (10 mg total) by mouth at bedtime. (Patient not taking: Reported on 05/29/2022) 10 tablet 0   gabapentin (NEURONTIN) 300 MG capsule Take 1 capsule (300 mg total) by mouth at bedtime. (Patient not taking: Reported on 05/29/2022) 30 capsule 0   metroNIDAZOLE (FLAGYL) 500 MG tablet Take 1 tablet (500 mg total) by mouth 2 (two) times daily. (Patient not taking: Reported on 05/29/2022) 14 tablet 0   naproxen (NAPROSYN) 500 MG tablet Take 1 tablet (500 mg total) by mouth 2 (two) times daily. (Patient not taking: Reported on 05/29/2022) 30  tablet 0   nitrofurantoin, macrocrystal-monohydrate, (MACROBID) 100 MG capsule Take 1 capsule (100 mg total) by mouth 2 (two) times daily. (Patient not taking: Reported on 05/29/2022) 10 capsule 0   predniSONE (DELTASONE) 20 MG tablet Take 2 tablets (40 mg total) by mouth daily. (Patient not taking: Reported on 05/29/2022) 10 tablet 0   No facility-administered medications prior to visit.     ROS Review of Systems  Constitutional:  Negative for activity change and appetite change.  HENT:  Negative for sinus pressure and sore throat.   Respiratory:  Negative for chest tightness, shortness of breath and wheezing.   Cardiovascular:  Negative for chest pain and palpitations.  Gastrointestinal:  Negative for abdominal distention, abdominal  pain and constipation.  Genitourinary: Negative.   Musculoskeletal: Negative.   Neurological:  Positive for numbness.  Psychiatric/Behavioral:  Negative for behavioral problems and dysphoric mood.     Objective:  BP (!) 140/88   Pulse 92   Temp 98.8 F (37.1 C) (Oral)   Ht _0  (1.575 m)   Wt 114 lb (51.7 kg)   SpO2 100%   BMI 20.85 kg/m      05/29/2022    1:42 PM 02/11/2022   11:56 AM 10/09/2021    2:40 PM  BP/Weight  Systolic BP 270 350 093  Diastolic BP 88 91 97  Wt. (Lbs) 114  113.1  BMI 20.85 kg/m2  20.69 kg/m2      Physical Exam Constitutional:      Appearance: She is well-developed.  Cardiovascular:     Rate and Rhythm: Normal rate.     Heart sounds: Normal heart sounds. No murmur heard. Pulmonary:     Effort: Pulmonary effort is normal.     Breath sounds: Normal breath sounds. No wheezing or rales.  Chest:     Chest wall: No tenderness.  Abdominal:     General: Bowel sounds are normal. There is no distension.     Palpations: Abdomen is soft. There is no mass.     Tenderness: There is no abdominal tenderness.  Musculoskeletal:        General: Normal range of motion.     Right lower leg: No edema.     Left lower leg: No edema.  Neurological:     Mental Status: She is alert and oriented to person, place, and time.  Psychiatric:        Mood and Affect: Mood normal.        Latest Ref Rng & Units 05/29/2022    2:25 PM 05/14/2019   10:28 AM 04/28/2019   12:22 PM  CMP  Glucose 70 - 99 mg/dL 81  121  109   BUN 6 - 20 mg/dL 10  7  <5   Creatinine 0.57 - 1.00 mg/dL 0.62  0.68  0.69   Sodium 134 - 144 mmol/L 142  142  136   Potassium 3.5 - 5.2 mmol/L 3.9  3.8  3.2   Chloride 96 - 106 mmol/L 104  105  99   CO2 20 - 29 mmol/L _1 Calcium 8.7 - 10.2 mg/dL 9.5  9.9  10.1   Total Protein 6.0 - 8.5 g/dL 7.2   7.4   Total Bilirubin 0.0 - 1.2 mg/dL 0.8   1.6   Alkaline Phos 44 - 121 IU/L 112   144   AST 0 - 40 IU/L 38   39   ALT 0 - 32 IU/L 26  28      Lipid Panel  No results found for: "CHOL", "TRIG", "HDL", "CHOLHDL", "VLDL", "LDLCALC", "LDLDIRECT"  CBC    Component Value Date/Time   WBC 9.0 05/29/2022 1425   WBC 10.4 04/28/2019 1222   RBC 4.49 05/29/2022 1425   RBC 4.60 04/28/2019 1222   HGB 14.3 05/29/2022 1425   HCT 41.4 05/29/2022 1425   PLT 245 05/29/2022 1425   MCV 92 05/29/2022 1425   MCH 31.8 05/29/2022 1425   MCH 34.8 (H) 04/28/2019 1222   MCHC 34.5 05/29/2022 1425   MCHC 36.7 (H) 04/28/2019 1222   RDW 11.9 05/29/2022 1425   LYMPHSABS 1.7 05/29/2022 1425   MONOABS 0.5 04/22/2019 0857   EOSABS 0.1 05/29/2022 1425   BASOSABS 0.1 05/29/2022 1425    Lab Results  Component Value Date   HGBA1C 4.9 05/29/2022    Assessment & Plan:  1. Primary hypertension New diagnosis Blood pressure has been running high We will initiate antihypertensive Counseled on blood pressure goal of less than 130/80, low-sodium, DASH diet, medication compliance, 150 minutes of moderate intensity exercise per week. Discussed medication compliance, adverse effects. - Basic Metabolic Panel - amLODipine (NORVASC) 2.5 MG tablet; Take 1 tablet (2.5 mg total) by mouth daily.  Dispense: 30 tablet; Refill: 3 - CMP14+EGFR  2. Alcoholic peripheral neuropathy (HCC) Uncontrolled Counseled on the need to quit alcohol - gabapentin (NEURONTIN) 300 MG capsule; Take 1 capsule (300 mg total) by mouth at bedtime.  Dispense: 30 capsule; Refill: 3 - thiamine (VITAMIN B1) 100 MG tablet; Take 1 tablet (100 mg total) by mouth daily.  Dispense: 30 tablet; Refill: 3 - folic acid (FOLVITE) 1 MG tablet; Take 1 tablet (1 mg total) by mouth daily.  Dispense: 30 tablet; Refill: 3 - naltrexone (DEPADE) 50 MG tablet; Take 1 tablet (50 mg total) by mouth daily.  Dispense: 30 tablet; Refill: 3 - Vitamin B12 - Hemoglobin A1c - CBC with Differential/Platelet  3. Need for hepatitis C screening test - HCV Ab w Reflex to Quant PCR  4. Anxiety Symptoms are  intermittent hence will hold off on SSRI and use hydroxyzine as needed - hydrOXYzine (ATARAX) 25 MG tablet; Take 1 tablet (25 mg total) by mouth 3 (three) times daily as needed for anxiety.  Dispense: 60 tablet; Refill: 1    Meds ordered this encounter  Medications   gabapentin (NEURONTIN) 300 MG capsule    Sig: Take 1 capsule (300 mg total) by mouth at bedtime.    Dispense:  30 capsule    Refill:  3   hydrOXYzine (ATARAX) 25 MG tablet    Sig: Take 1 tablet (25 mg total) by mouth 3 (three) times daily as needed for anxiety.    Dispense:  60 tablet    Refill:  1   amLODipine (NORVASC) 2.5 MG tablet    Sig: Take 1 tablet (2.5 mg total) by mouth daily.    Dispense:  30 tablet    Refill:  3   thiamine (VITAMIN B1) 100 MG tablet    Sig: Take 1 tablet (100 mg total) by mouth daily.    Dispense:  30 tablet    Refill:  3   folic acid (FOLVITE) 1 MG tablet    Sig: Take 1 tablet (1 mg total) by mouth daily.    Dispense:  30 tablet    Refill:  3   naltrexone (DEPADE) 50 MG tablet    Sig: Take 1 tablet (50 mg total) by mouth daily.  Dispense:  30 tablet    Refill:  3    Follow-up: Return in about 3 months (around 08/29/2022) for Hypertension.       Charlott Rakes, MD, FAAFP. [facility], [place]   05/30/2022, 2:43 PM

## 2022-05-30 ENCOUNTER — Encounter: Payer: Self-pay | Admitting: Family Medicine

## 2022-05-30 ENCOUNTER — Ambulatory Visit: Payer: Self-pay | Admitting: *Deleted

## 2022-05-30 LAB — CMP14+EGFR
ALT: 26 IU/L (ref 0–32)
AST: 38 IU/L (ref 0–40)
Albumin/Globulin Ratio: 2.6 — ABNORMAL HIGH (ref 1.2–2.2)
Albumin: 5.2 g/dL — ABNORMAL HIGH (ref 4.0–5.0)
Alkaline Phosphatase: 112 IU/L (ref 44–121)
BUN/Creatinine Ratio: 16 (ref 9–23)
BUN: 10 mg/dL (ref 6–20)
Bilirubin Total: 0.8 mg/dL (ref 0.0–1.2)
CO2: 24 mmol/L (ref 20–29)
Calcium: 9.5 mg/dL (ref 8.7–10.2)
Chloride: 104 mmol/L (ref 96–106)
Creatinine, Ser: 0.62 mg/dL (ref 0.57–1.00)
Globulin, Total: 2 g/dL (ref 1.5–4.5)
Glucose: 81 mg/dL (ref 70–99)
Potassium: 3.9 mmol/L (ref 3.5–5.2)
Sodium: 142 mmol/L (ref 134–144)
Total Protein: 7.2 g/dL (ref 6.0–8.5)
eGFR: 123 mL/min/{1.73_m2} (ref >59)

## 2022-05-30 LAB — CBC WITH DIFFERENTIAL/PLATELET
Basophils Absolute: 0.1 10*3/uL (ref 0.0–0.2)
Basos: 1 %
EOS (ABSOLUTE): 0.1 10*3/uL (ref 0.0–0.4)
Eos: 1 %
Hematocrit: 41.4 % (ref 34.0–46.6)
Hemoglobin: 14.3 g/dL (ref 11.1–15.9)
Immature Grans (Abs): 0 10*3/uL (ref 0.0–0.1)
Immature Granulocytes: 0 %
Lymphocytes Absolute: 1.7 10*3/uL (ref 0.7–3.1)
Lymphs: 18 %
MCH: 31.8 pg (ref 26.6–33.0)
MCHC: 34.5 g/dL (ref 31.5–35.7)
MCV: 92 fL (ref 79–97)
Monocytes Absolute: 0.6 10*3/uL (ref 0.1–0.9)
Monocytes: 7 %
Neutrophils Absolute: 6.6 10*3/uL (ref 1.4–7.0)
Neutrophils: 73 %
Platelets: 245 10*3/uL (ref 150–450)
RBC: 4.49 x10E6/uL (ref 3.77–5.28)
RDW: 11.9 % (ref 11.7–15.4)
WBC: 9 10*3/uL (ref 3.4–10.8)

## 2022-05-30 LAB — HEMOGLOBIN A1C
Est. average glucose Bld gHb Est-mCnc: 94 mg/dL
Hgb A1c MFr Bld: 4.9 % (ref 4.8–5.6)

## 2022-05-30 LAB — HCV INTERPRETATION

## 2022-05-30 LAB — HCV AB W REFLEX TO QUANT PCR: HCV Ab: NONREACTIVE

## 2022-05-30 LAB — VITAMIN B12: Vitamin B-12: 540 pg/mL (ref 232–1245)

## 2022-05-30 NOTE — Telephone Encounter (Signed)
Per agent: "Pt requests call back to discuss the medications that were prescribed and if it is safe for her to take them all at once. Cb# [phone]"   Chief Complaint: Medication Question Symptoms: "Can I take them all together?" Frequency: NA Pertinent Negatives: Patient denies NA Disposition: [] ED /[] Urgent Care (no appt availability in office) / [] Appointment(In office/virtual)/ []  [facility]/ [x] Home Care/ [] Refused Recommended Disposition /[] [facility]/ []  Follow-up with PCP Additional Notes: Questions answered to pts satisfaction, reviewed meds. Also advised to use pharmacist as resource. Pt verbalizes understanding.   Reason for Disposition  Caller has medicine question only, adult not sick, AND triager answers question  Answer Assessment - Initial Assessment Questions 1. NAME of MEDICINE: "What medicine(s) are you calling about?" All   2. QUESTION: "What is your question?" (e.g., double dose of medicine, side effect)     OK to take all together? 3. PRESCRIBER: "Who prescribed the medicine?" Reason: if prescribed by specialist, call should be referred to that group.     PCP  Protocols used: Medication Question Call-A-AH

## 2022-06-05 ENCOUNTER — Ambulatory Visit: Payer: Self-pay | Admitting: *Deleted

## 2022-06-05 NOTE — Telephone Encounter (Signed)
Noted  

## 2022-06-05 NOTE — Telephone Encounter (Signed)
Summary: Pt has some questions about the Rx for naltrexone   Pt requests call back because she has some questions about the Rx for naltrexone (DEPADE) 50 MG tablet. Cb# [phone]     Call to patient:  Patient is calling to ask about SE, withdrawal symptoms from alcohol and drug interactions with naltrexone. Per information in [facility]- no drug to drug interactions with gabapentin, amlodipine and naltrexone. Reviewed common side effects listed and no information found on effects on withdrawal symptoms found in literature. Discussed action of medication: suppress craving and suppress effects of alcohol. Reason for Disposition  Caller has medicine question, adult has minor symptoms, caller declines triage, AND triager answers question  Answer Assessment - Initial Assessment Questions 1. NAME of MEDICINE: "What medicine(s) are you calling about?"     Naltrexone  2. QUESTION: "What is your question?" (e.g., double dose of medicine, side effect)     See notes 3. PRESCRIBER: "Who prescribed the medicine?" Reason: if prescribed by specialist, call should be referred to that group.     PCP  Protocols used: Medication Question Call-A-AH

## 2022-06-06 ENCOUNTER — Ambulatory Visit: Payer: Self-pay

## 2022-06-06 ENCOUNTER — Telehealth: Payer: Self-pay | Admitting: Family Medicine

## 2022-06-06 NOTE — Telephone Encounter (Signed)
Summary: Naltrexone? Shakes   The patient called back in wanting to speak with her provider about the medication naltrexone (DEPADE) 50 MG tablet. She talked to a nurse yesterday but says she forgot to ask a question. She says the medication says wait 3 days after you drink or after you are finished with the withdrawal. She says she has the shakes and just wants to make sure it is ok to take now or should she wait. Please assist patient further.     Called pt - [id number]

## 2022-06-06 NOTE — Telephone Encounter (Signed)
Message from Roslynn Amble sent at 06/06/2022  9:34 AM EDT  Summary: Naltrexone? Shakes   The patient called back in wanting to speak with her provider about the medication naltrexone (DEPADE) 50 MG tablet. She talked to a nurse yesterday but says she forgot to ask a question. She says the medication says wait 3 days after you drink or after you are finished with the withdrawal. She says she has the shakes and just wants to make sure it is ok to take now or should she wait. Please assist patient further.         Called pt a second time. LM on VM to call back to discuss with nurse.

## 2022-06-06 NOTE — Telephone Encounter (Signed)
Message from Roslynn Amble sent at 06/06/2022  9:34 AM EDT  Summary: Naltrexone? Shakes   The patient called back in wanting to speak with her provider about the medication naltrexone (DEPADE) 50 MG tablet. She talked to a nurse yesterday but says she forgot to ask a question. She says the medication says wait 3 days after you drink or after you are finished with the withdrawal. She says she has the shakes and just wants to make sure it is ok to take now or should she wait. Please assist patient further.         Called pt and LM on VM to call back to discuss.

## 2022-06-06 NOTE — Telephone Encounter (Signed)
Pt aware of missing docs needed to complete orange card app.

## 2022-06-07 ENCOUNTER — Encounter: Payer: Self-pay | Admitting: *Deleted

## 2022-06-07 NOTE — Telephone Encounter (Signed)
Left message on voicemail to return call.  Sent message regarding naltrexone from Dr. Wynetta Emery to [facility].   Message below from Dr. Wynetta Emery - Let patient know that she can start the naltrexone now.

## 2022-06-10 NOTE — Telephone Encounter (Signed)
Patient aware she can begin medication. She states she had not started medication as of yet, 06/10/2022

## 2022-06-11 ENCOUNTER — Ambulatory Visit: Payer: PRIVATE HEALTH INSURANCE | Attending: Family Medicine

## 2022-06-11 NOTE — Progress Notes (Unsigned)
Pt visited for financial counseling to apply for orange card. Will return w/ taxes to complete app and apply for CAFA. Also, need to inquire a/b husband's income for app.

## 2022-06-11 NOTE — Progress Notes (Deleted)
Pt visited for financial counseling to apply for orange card. Will return w/ taxes to complete app and apply for CAFA. Also, need to inquire a/b husband's income for app. 

## 2022-07-21 ENCOUNTER — Emergency Department (HOSPITAL_BASED_OUTPATIENT_CLINIC_OR_DEPARTMENT_OTHER): Payer: Commercial Managed Care - HMO | Admitting: Radiology

## 2022-07-21 ENCOUNTER — Encounter (HOSPITAL_BASED_OUTPATIENT_CLINIC_OR_DEPARTMENT_OTHER): Payer: Self-pay | Admitting: Emergency Medicine

## 2022-07-21 ENCOUNTER — Emergency Department (HOSPITAL_BASED_OUTPATIENT_CLINIC_OR_DEPARTMENT_OTHER)
Admission: EM | Admit: 2022-07-21 | Discharge: 2022-07-21 | Disposition: A | Discharge status: Home / Self Care | Payer: Commercial Managed Care - HMO | Attending: Emergency Medicine | Admitting: Emergency Medicine

## 2022-07-21 ENCOUNTER — Other Ambulatory Visit: Payer: Self-pay

## 2022-07-21 DIAGNOSIS — J069 Acute upper respiratory infection, unspecified: Secondary | ICD-10-CM

## 2022-07-21 DIAGNOSIS — R000 Tachycardia, unspecified: Secondary | ICD-10-CM | POA: Insufficient documentation

## 2022-07-21 DIAGNOSIS — D72829 Elevated white blood cell count, unspecified: Secondary | ICD-10-CM | POA: Diagnosis not present

## 2022-07-21 DIAGNOSIS — Z1152 Encounter for screening for COVID-19: Secondary | ICD-10-CM | POA: Diagnosis not present

## 2022-07-21 DIAGNOSIS — R059 Cough, unspecified: Secondary | ICD-10-CM | POA: Diagnosis present

## 2022-07-21 LAB — CBC WITH DIFFERENTIAL/PLATELET
Abs Immature Granulocytes: 0.04 10*3/uL (ref 0.00–0.07)
Basophils Absolute: 0.1 10*3/uL (ref 0.0–0.1)
Basophils Relative: 0 %
Eosinophils Absolute: 0 10*3/uL (ref 0.0–0.5)
Eosinophils Relative: 0 %
HCT: 42.7 % (ref 36.0–46.0)
Hemoglobin: 14.9 g/dL (ref 12.0–15.0)
Immature Granulocytes: 0 %
Lymphocytes Relative: 3 %
Lymphs Abs: 0.3 10*3/uL — ABNORMAL LOW (ref 0.7–4.0)
MCH: 32.2 pg (ref 26.0–34.0)
MCHC: 34.9 g/dL (ref 30.0–36.0)
MCV: 92.2 fL (ref 80.0–100.0)
Monocytes Absolute: 0.7 10*3/uL (ref 0.1–1.0)
Monocytes Relative: 5 %
Neutro Abs: 11.5 10*3/uL — ABNORMAL HIGH (ref 1.7–7.7)
Neutrophils Relative %: 92 %
Platelets: 289 10*3/uL (ref 150–400)
RBC: 4.63 MIL/uL (ref 3.87–5.11)
RDW: 13.2 % (ref 11.5–15.5)
WBC: 12.6 10*3/uL — ABNORMAL HIGH (ref 4.0–10.5)
nRBC: 0 % (ref 0.0–0.2)

## 2022-07-21 LAB — RESP PANEL BY RT-PCR (RSV, FLU A&B, COVID)  RVPGX2
Influenza A by PCR: NEGATIVE
Influenza B by PCR: NEGATIVE
Resp Syncytial Virus by PCR: NEGATIVE
SARS Coronavirus 2 by RT PCR: NEGATIVE

## 2022-07-21 LAB — BASIC METABOLIC PANEL
Anion gap: 13 (ref 5–15)
BUN: 7 mg/dL (ref 6–20)
CO2: 24 mmol/L (ref 22–32)
Calcium: 9 mg/dL (ref 8.9–10.3)
Chloride: 105 mmol/L (ref 98–111)
Creatinine, Ser: 0.52 mg/dL (ref 0.44–1.00)
GFR, Estimated: >60 mL/min (ref >60)
Glucose, Bld: 100 mg/dL — ABNORMAL HIGH (ref 70–99)
Potassium: 3.5 mmol/L (ref 3.5–5.1)
Sodium: 142 mmol/L (ref 135–145)

## 2022-07-21 LAB — URINALYSIS, ROUTINE W REFLEX MICROSCOPIC
Bilirubin Urine: NEGATIVE
Glucose, UA: NEGATIVE mg/dL
Hgb urine dipstick: NEGATIVE
Ketones, ur: NEGATIVE mg/dL
Leukocytes,Ua: NEGATIVE
Nitrite: NEGATIVE
Specific Gravity, Urine: 1.018 (ref 1.005–1.030)
pH: 8 (ref 5.0–8.0)

## 2022-07-21 LAB — PREGNANCY, URINE: Preg Test, Ur: NEGATIVE

## 2022-07-21 LAB — TROPONIN I (HIGH SENSITIVITY): Troponin I (High Sensitivity): <2 ng/L (ref <18)

## 2022-07-21 LAB — GROUP A STREP BY PCR: Group A Strep by PCR: NOT DETECTED

## 2022-07-21 LAB — D-DIMER, QUANTITATIVE: D-Dimer, Quant: 0.31 ug/mL-FEU (ref 0.00–0.50)

## 2022-07-21 MED ORDER — ACETAMINOPHEN 500 MG PO TABS
1000.0000 mg | ORAL_TABLET | Freq: Once | ORAL | Status: AC
  Administered 2022-07-21: 1000 mg via ORAL
  Filled 2022-07-21: qty 2

## 2022-07-21 MED ORDER — SODIUM CHLORIDE 0.9 % IV BOLUS
1000.0000 mL | Freq: Once | INTRAVENOUS | Status: AC
  Administered 2022-07-21: 1000 mL via INTRAVENOUS

## 2022-07-21 MED ORDER — CETIRIZINE HCL 10 MG PO TABS: 10.0000 mg | ORAL_TABLET | Freq: Every day | ORAL | 0 refills | Status: DC

## 2022-07-21 MED ORDER — FLUTICASONE PROPIONATE 50 MCG/ACT NA SUSP: 2.0000 | Freq: Every day | NASAL | 0 refills | Status: DC

## 2022-07-21 MED ORDER — BENZONATATE 100 MG PO CAPS: 100.0000 mg | ORAL_CAPSULE | Freq: Three times a day (TID) | ORAL | 0 refills | Status: DC

## 2022-07-21 NOTE — Discharge Instructions (Signed)
Workup today was reassuring.  This is likely a viral infection.   I have started you on a few medications to help with your symptoms.    Follow-up outpatient, return for new or worsening symptoms.

## 2022-07-21 NOTE — ED Provider Notes (Signed)
[facility] EMERGENCY DEPT Provider Note   CSN: [id number] Arrival date & time: 07/21/22  1122    History  Chief Complaint  Patient presents with   Cough    Marcia Jackson is a 31 y.o. female with no significant past medical history here for evaluation of feeling unwell.  Yesterday developed some myalgias, pleuritic chest pain, cough.  Feels generally weak.  Has had some chills without documented fever.  Minor sore throat.  Has been tolerating p.o. intake.  No fever, headache, back pain, abdominal pain, diarrhea or dysuria.  Cough is nonproductive.  No pain or swelling to lower extremities. No meds PTA.  HPI     Home Medications Prior to Admission medications   Medication Sig Start Date End Date Taking? Authorizing Provider  benzonatate (TESSALON) 100 MG capsule Take 1 capsule (100 mg total) by mouth every 8 (eight) hours. 07/21/22  Yes Coraline Talwar A, PA-C  cetirizine (ZYRTEC ALLERGY) 10 MG tablet Take 1 tablet (10 mg total) by mouth daily. 07/21/22  Yes Breslyn Abdo A, PA-C  fluticasone (FLONASE) 50 MCG/ACT nasal spray Place 2 sprays into both nostrils daily. 07/21/22  Yes Ayomikun Starling A, PA-C  amLODipine (NORVASC) 2.5 MG tablet Take 1 tablet (2.5 mg total) by mouth daily. 05/29/22   Hoy Register, MD  folic acid (FOLVITE) 1 MG tablet Take 1 tablet (1 mg total) by mouth daily. 05/29/22   Hoy Register, MD  gabapentin (NEURONTIN) 300 MG capsule Take 1 capsule (300 mg total) by mouth at bedtime. 05/29/22   Hoy Register, MD  hydrOXYzine (ATARAX) 25 MG tablet Take 1 tablet (25 mg total) by mouth 3 (three) times daily as needed for anxiety. 05/29/22   Hoy Register, MD  naltrexone (DEPADE) 50 MG tablet Take 1 tablet (50 mg total) by mouth daily. 05/29/22   Hoy Register, MD  thiamine (VITAMIN B1) 100 MG tablet Take 1 tablet (100 mg total) by mouth daily. 05/29/22   Hoy Register, MD  famotidine (PEPCID) 20 MG tablet Take 1 tablet (20 mg total)  by mouth 2 (two) times daily. Patient not taking: Reported on 05/12/2019 04/28/19 05/19/20  Renne Crigler, PA-C  potassium chloride SA (K-DUR) 20 MEQ tablet Take 1 tablet (20 mEq total) by mouth 2 (two) times daily for 4 days. Patient not taking: Reported on 05/12/2019 04/22/19 05/19/20  Pricilla Loveless, MD  promethazine (PHENERGAN) 25 MG tablet Take 1 tablet (25 mg total) by mouth every 6 (six) hours as needed for nausea or vomiting. Patient not taking: Reported on 05/12/2019 03/22/19 05/19/20  Derwood Kaplan, MD      Allergies    Patient has no known allergies.    Review of Systems   Review of Systems  Constitutional:  Positive for activity change, appetite change, chills and fatigue.  HENT:  Positive for sore throat.   Respiratory:  Positive for cough. Negative for apnea, choking, chest tightness, shortness of breath, wheezing and stridor.   Cardiovascular:  Positive for chest pain (pleuritic with coughing).  Gastrointestinal: Negative.   Genitourinary: Negative.   Musculoskeletal:  Positive for myalgias.  Skin: Negative.   Neurological: Negative.   All other systems reviewed and are negative.   Physical Exam Updated Vital Signs BP 107/68   Pulse 98   Temp 98.6 F (37 C) (Oral)   Resp 18   SpO2 100%  Physical Exam Vitals and nursing note reviewed.  Constitutional:      General: She is not in acute distress.    Appearance:  She is well-developed. She is not ill-appearing, toxic-appearing or diaphoretic.  HENT:     Head: Normocephalic and atraumatic.     Nose: Nose normal.     Mouth/Throat:     Mouth: Mucous membranes are dry.  Eyes:     Pupils: Pupils are equal, round, and reactive to light.  Cardiovascular:     Rate and Rhythm: Tachycardia present.     Comments: Heart rate 130s, sinus tach Pulmonary:     Effort: Pulmonary effort is normal. No respiratory distress.     Breath sounds: Normal breath sounds.     Comments: Clear bil, speaks in full sentences without  difficulty Abdominal:     General: Bowel sounds are normal. There is no distension.     Palpations: Abdomen is soft.     Tenderness: There is no abdominal tenderness. There is no right CVA tenderness, left CVA tenderness, guarding or rebound.  Musculoskeletal:        General: No swelling, tenderness, deformity or signs of injury. Normal range of motion.     Cervical back: Normal range of motion.     Right lower leg: No edema.     Left lower leg: No edema.  Skin:    General: Skin is warm and dry.     Capillary Refill: Capillary refill takes less than 2 seconds.     Comments: No rashes or lesions  Neurological:     General: No focal deficit present.     Mental Status: She is alert and oriented to person, place, and time.  Psychiatric:        Mood and Affect: Mood normal.     ED Results / Procedures / Treatments   Labs (all labs ordered are listed, but only abnormal results are displayed) Labs Reviewed  CBC WITH DIFFERENTIAL/PLATELET - Abnormal; Notable for the following components:      Result Value   WBC 12.6 (*)    Neutro Abs 11.5 (*)    Lymphs Abs 0.3 (*)    All other components within normal limits  BASIC METABOLIC PANEL - Abnormal; Notable for the following components:   Glucose, Bld 100 (*)    All other components within normal limits  URINALYSIS, ROUTINE W REFLEX MICROSCOPIC - Abnormal; Notable for the following components:   Protein, ur TRACE (*)    All other components within normal limits  RESP PANEL BY RT-PCR (RSV, FLU A&B, COVID)  [id number]  GROUP A STREP BY PCR  D-DIMER, QUANTITATIVE  PREGNANCY, URINE  TROPONIN I (HIGH SENSITIVITY)  TROPONIN I (HIGH SENSITIVITY)    EKG None  Radiology DG Chest 2 View  Result Date: 07/21/2022 CLINICAL DATA:  Chest pain and shortness of breath. EXAM: CHEST - 2 VIEW COMPARISON:  04/21/2008 FINDINGS: Heart size and mediastinal contours appear normal. No pleural effusion or edema. Central airway thickening noted. No airspace  consolidation. Visualized osseous structures are unremarkable. IMPRESSION: Central airway thickening without evidence for pneumonia. Electronically Signed   By: Signa Kell M.D.   On: 07/21/2022 14:09    Procedures Procedures    Medications Ordered in ED Medications  sodium chloride 0.9 % bolus 1,000 mL ( Intravenous Stopped 07/21/22 1558)  acetaminophen (TYLENOL) tablet 1,000 mg (1,000 mg Oral Given 07/21/22 1438)    ED Course/ Medical Decision Making/ A&P    31 year old here for evaluation of feeling unwell.  24 hours of chills, myalgias, cough, pleuritic chest pain, sore throat.  No sick contacts.  On arrival she appears to not  feel well however does not appear septic.  Her heart and lungs are clear.  She appears clinically dehydrated.  Her abdomen is soft, nontender.  She has no rashes or lesions.  She has full range of motion to her neck without rigidity or meningismus.    Labs and imaging personally viewed and interpreted:  COVID, flu, RSV negative Chest x-ray central airway thickening without pneumonia EKG CBC leukocytosis 12.6 Metabolic panel no significant abnormality Troponin <2 D-dimer 0.31 Strep negative UA neg for infection Preg neg   Patient reassessed.  Vital signs improved with IV fluids.  Suspect she has likely viral illness.  At this time suspicion for acute ACS, PE, dissection, sepsis, strep pharyngitis, PTA, RPA  D/c with symptomatic management  The patient has been appropriately medically screened and/or stabilized in the ED. I have low suspicion for any other emergent medical condition which would require further screening, evaluation or treatment in the ED or require inpatient management.  Patient is hemodynamically stable and in no acute distress.  Patient able to ambulate in department prior to ED.  Evaluation does not show acute pathology that would require ongoing or additional emergent interventions while in the emergency department or further  inpatient treatment.  I have discussed the diagnosis with the patient and answered all questions.  Pain is been managed while in the emergency department and patient has no further complaints prior to discharge.  Patient is comfortable with plan discussed in room and is stable for discharge at this time.  I have discussed strict return precautions for returning to the emergency department.  Patient was encouraged to follow-up with PCP/specialist refer to at discharge.                             Medical Decision Making Amount and/or Complexity of Data Reviewed External Data Reviewed: labs, radiology, ECG and notes. Labs: ordered. Decision-making details documented in ED Course. Radiology: ordered and independent interpretation performed. Decision-making details documented in ED Course. ECG/medicine tests: ordered and independent interpretation performed. Decision-making details documented in ED Course.  Risk OTC drugs. Prescription drug management. Parenteral controlled substances. Decision regarding hospitalization. Diagnosis or treatment significantly limited by social determinants of health.          Final Clinical Impression(s) / ED Diagnoses Final diagnoses:  Viral URI with cough    Rx / DC Orders ED Discharge Orders          Ordered    fluticasone (FLONASE) 50 MCG/ACT nasal spray  Daily        07/21/22 1711    cetirizine (ZYRTEC ALLERGY) 10 MG tablet  Daily        07/21/22 1711    benzonatate (TESSALON) 100 MG capsule  Every 8 hours        07/21/22 1711              Aquan Kope A, PA-C 07/21/22 1712    Glynn Octave, MD 07/21/22 1738

## 2022-07-21 NOTE — ED Triage Notes (Addendum)
Pt presents to ED POV. Pt c/o body aches, cough, chest congestion, cp w/ resp, since last night.

## 2022-08-18 ENCOUNTER — Encounter (HOSPITAL_COMMUNITY): Payer: Self-pay | Admitting: *Deleted

## 2022-08-18 ENCOUNTER — Other Ambulatory Visit: Payer: Self-pay

## 2022-08-18 ENCOUNTER — Ambulatory Visit (HOSPITAL_COMMUNITY)
Admission: EM | Admit: 2022-08-18 | Discharge: 2022-08-18 | Disposition: A | Discharge status: Home / Self Care | Payer: Medicaid Other | Attending: Physician Assistant | Admitting: Physician Assistant

## 2022-08-18 DIAGNOSIS — K0889 Other specified disorders of teeth and supporting structures: Secondary | ICD-10-CM

## 2022-08-18 DIAGNOSIS — K047 Periapical abscess without sinus: Secondary | ICD-10-CM | POA: Diagnosis not present

## 2022-08-18 DIAGNOSIS — K029 Dental caries, unspecified: Secondary | ICD-10-CM | POA: Diagnosis not present

## 2022-08-18 MED ORDER — NAPROXEN 500 MG PO TABS: 500.0000 mg | ORAL_TABLET | Freq: Two times a day (BID) | ORAL | 0 refills | Status: DC

## 2022-08-18 MED ORDER — PENICILLIN V POTASSIUM 500 MG PO TABS: 500.0000 mg | ORAL_TABLET | Freq: Four times a day (QID) | ORAL | 0 refills | Status: AC

## 2022-08-18 NOTE — ED Triage Notes (Signed)
Pt reports dental pain started last night. Pt's dental pain on LT  side.

## 2022-08-18 NOTE — Discharge Instructions (Signed)
Advise to contact [facility] dental services at [phone].  The address is [address], [place], [place].  An alternate is Dr. Donn Pierini at [phone].  The address is [address] [place].  Past take the Pen-Vee K 500 mg every 6 hours on a regular basis to treat abscess formation. Advised take the Naprosyn 500 mg every 12 hours to help decrease the tooth pain.  Return to urgent care as needed.

## 2022-08-18 NOTE — ED Provider Notes (Signed)
[place]    CSN: [id number] Arrival date & time: 08/18/22  1025      History   Chief Complaint Chief Complaint  Patient presents with   Dental Pain    HPI Marcia Jackson is a 32 y.o. female.   32 year old female presents with tooth pain.  Patient indicates that she has a broken tooth on the left upper molar area.  She relates that this has been given her pain and discomfort for the past couple weeks but has been worse over the past several days.  She relates she has now started having some swelling of the cheek and the jaw area where the broken tooth is located.  Patient indicates she does not have fever or chills.  She indicates she has been taking some ibuprofen 600 mg but without relief of the discomfort.  Patient indicates she does not have a dentist and that her medical insurance does not cover dental issues.   Dental Pain   Past Medical History:  Diagnosis Date   Depression    History of heavy alcohol consumption     Patient Active Problem List   Diagnosis Date Noted   Primary hypertension 40/98/1191   Alcoholic peripheral neuropathy ([place]) 05/29/2022   Encounter for other contraceptive management 02/03/2017   Rh negative, antepartum 05/17/2016   Rubella non-immune status, antepartum 05/17/2016   Family history of congenital heart defect 05/07/2016    Past Surgical History:  Procedure Laterality Date   WISDOM TOOTH EXTRACTION      OB History     Gravida  2   Para  2   Term  2   Preterm      AB      Living  2      SAB      IAB      Ectopic      Multiple  0   Live Births  2            Home Medications    Prior to Admission medications   Medication Sig Start Date End Date Taking? Authorizing Provider  naproxen (NAPROSYN) 500 MG tablet Take 1 tablet (500 mg total) by mouth 2 (two) times daily. 08/18/22  Yes Nyoka Lint, PA-C  penicillin v potassium (VEETID) 500 MG tablet Take 1 tablet (500 mg total) by mouth 4 (four)  times daily for 10 days. 08/18/22 08/28/22 Yes Nyoka Lint, PA-C  amLODipine (NORVASC) 2.5 MG tablet Take 1 tablet (2.5 mg total) by mouth daily. 05/29/22   Charlott Rakes, MD  benzonatate (TESSALON) 100 MG capsule Take 1 capsule (100 mg total) by mouth every 8 (eight) hours. 07/21/22   Henderly, Britni A, PA-C  cetirizine (ZYRTEC ALLERGY) 10 MG tablet Take 1 tablet (10 mg total) by mouth daily. 07/21/22   Henderly, Britni A, PA-C  fluticasone (FLONASE) 50 MCG/ACT nasal spray Place 2 sprays into both nostrils daily. 07/21/22   Henderly, Britni A, PA-C  folic acid (FOLVITE) 1 MG tablet Take 1 tablet (1 mg total) by mouth daily. 05/29/22   Charlott Rakes, MD  gabapentin (NEURONTIN) 300 MG capsule Take 1 capsule (300 mg total) by mouth at bedtime. 05/29/22   Charlott Rakes, MD  hydrOXYzine (ATARAX) 25 MG tablet Take 1 tablet (25 mg total) by mouth 3 (three) times daily as needed for anxiety. 05/29/22   Charlott Rakes, MD  naltrexone (DEPADE) 50 MG tablet Take 1 tablet (50 mg total) by mouth daily. 05/29/22   Charlott Rakes, MD  thiamine (VITAMIN B1) 100 MG tablet Take 1 tablet (100 mg total) by mouth daily. 05/29/22   Hoy Register, MD  famotidine (PEPCID) 20 MG tablet Take 1 tablet (20 mg total) by mouth 2 (two) times daily. Patient not taking: Reported on 05/12/2019 04/28/19 05/19/20  Renne Crigler, PA-C  potassium chloride SA (K-DUR) 20 MEQ tablet Take 1 tablet (20 mEq total) by mouth 2 (two) times daily for 4 days. Patient not taking: Reported on 05/12/2019 04/22/19 05/19/20  Pricilla Loveless, MD  promethazine (PHENERGAN) 25 MG tablet Take 1 tablet (25 mg total) by mouth every 6 (six) hours as needed for nausea or vomiting. Patient not taking: Reported on 05/12/2019 03/22/19 05/19/20  Derwood Kaplan, MD    Family History Family History  Problem Relation Age of Onset   Diabetes Maternal Aunt    Cancer Maternal Grandmother    Diabetes Maternal Grandmother    Healthy Mother    Healthy Father      Social History Social History   Tobacco Use   Smoking status: Former    Packs/day: 0.15    Types: Cigarettes   Smokeless tobacco: Never  Vaping Use   Vaping Use: Some days  Substance Use Topics   Alcohol use: Yes    Alcohol/week: 4.0 standard drinks of alcohol    Types: 4 Standard drinks or equivalent per week    Comment: No longer drinks straight liquor per pt   Drug use: No     Allergies   Patient has no known allergies.   Review of Systems Review of Systems  HENT:  Positive for dental problem (left upper dental caries, broken tooth).      Physical Exam Triage Vital Signs ED Triage Vitals  Enc Vitals Group     BP 08/18/22 1147 (!) 137/90     Pulse Rate 08/18/22 1147 78     Resp 08/18/22 1147 18     Temp 08/18/22 1147 98.5 F (36.9 C)     Temp src --      SpO2 08/18/22 1147 98 %     Weight --      Height --      Head Circumference --      Peak Flow --      Pain Score 08/18/22 1145 5     Pain Loc --      Pain Edu? --      Excl. in GC? --    No data found.  Updated Vital Signs BP (!) 137/90   Pulse 78   Temp 98.5 F (36.9 C)   Resp 18   SpO2 98%   Visual Acuity Right Eye Distance:   Left Eye Distance:   Bilateral Distance:    Right Eye Near:   Left Eye Near:    Bilateral Near:     Physical Exam Constitutional:      Appearance: Normal appearance.  HENT:     Right Ear: Tympanic membrane and ear canal normal.     Left Ear: Tympanic membrane and ear canal normal.     Mouth/Throat:      Comments: Mouth: There are broken molars left upper area with tenderness on palpation of the exterior cheek area with minimal swelling and no redness present Lymphadenopathy:     Cervical: No cervical adenopathy.  Neurological:     Mental Status: She is alert.      UC Treatments / Results  Labs (all labs ordered are listed, but only abnormal results are displayed) Labs Reviewed -  No data to display  EKG   Radiology No results  found.  Procedures Procedures (including critical care time)  Medications Ordered in UC Medications - No data to display  Initial Impression / Assessment and Plan / UC Course  I have reviewed the triage vital signs and the nursing notes.  Pertinent labs & imaging results that were available during my care of the patient were reviewed by me and considered in my medical decision making (see chart for details).    Plan: 1.  Dental pain will be treated with the following: A.  Naprosyn 500 mg every 12 hours to help reduce pain and discomfort. 2.  The dental abscess will be treated with the following: A.  Pen-Vee K 500 mg every 6 hours to treat the dental abscess. 3.  The dental caries will be treated with the following: A.  Patient is given information on [facility] dental services to call for an appointment. 4.  Advised follow-up PCP or return to urgent care if symptoms fail to improve. Final Clinical Impressions(s) / UC Diagnoses   Final diagnoses:  Pain, dental  Dental caries  Dental abscess     Discharge Instructions      Advise to contact [facility] dental services at [phone].  The address is [address] [place], [address].  An alternate is Dr. Lawrence Marseilles at [phone].  The address is [address] [place] [address].  Past take the Pen-Vee K 500 mg every 6 hours on a regular basis to treat abscess formation. Advised take the Naprosyn 500 mg every 12 hours to help decrease the tooth pain.  Return to urgent care as needed.    ED Prescriptions     Medication Sig Dispense Auth. Provider   penicillin v potassium (VEETID) 500 MG tablet Take 1 tablet (500 mg total) by mouth 4 (four) times daily for 10 days. 40 tablet Ellsworth Lennox, PA-C   naproxen (NAPROSYN) 500 MG tablet Take 1 tablet (500 mg total) by mouth 2 (two) times daily. 30 tablet Ellsworth Lennox, PA-C      PDMP not reviewed this encounter.   Ellsworth Lennox, PA-C 08/18/22 1219

## 2022-08-29 ENCOUNTER — Ambulatory Visit: Payer: Medicaid Other | Attending: Family Medicine | Admitting: Family Medicine

## 2022-09-01 ENCOUNTER — Other Ambulatory Visit: Payer: Self-pay | Admitting: Family Medicine

## 2022-09-01 DIAGNOSIS — I10 Essential (primary) hypertension: Secondary | ICD-10-CM

## 2022-09-02 NOTE — Telephone Encounter (Signed)
Requested Prescriptions  Pending Prescriptions Disp Refills   amLODipine (NORVASC) 2.5 MG tablet [Pharmacy Med Name: AMLODIPINE BESYLATE 2.5MG  TABLETS] 30 tablet 3    Sig: TAKE 1 TABLET(2.5 MG) BY MOUTH DAILY     Cardiovascular: Calcium Channel Blockers 2 Failed - 09/01/2022  7:10 AM      Failed - Last BP in normal range    BP Readings from Last 1 Encounters:  08/18/22 (!) 137/90         Passed - Last Heart Rate in normal range    Pulse Readings from Last 1 Encounters:  08/18/22 78         Passed - Valid encounter within last 6 months    Recent Outpatient Visits           3 months ago Primary hypertension   [place], [place]   3 years ago Neuropathy   [place] [facility], [place], [place]

## 2022-11-11 ENCOUNTER — Encounter: Payer: Self-pay | Admitting: Family Medicine

## 2022-11-11 ENCOUNTER — Ambulatory Visit: Payer: Medicaid Other | Attending: Family Medicine | Admitting: Family Medicine

## 2022-11-11 VITALS — BP 131/85 | HR 79 | Ht 62.0 in | Wt 114.2 lb

## 2022-11-11 DIAGNOSIS — G621 Alcoholic polyneuropathy: Secondary | ICD-10-CM

## 2022-11-11 DIAGNOSIS — I10 Essential (primary) hypertension: Secondary | ICD-10-CM

## 2022-11-11 MED ORDER — FOLIC ACID 1 MG PO TABS: 1.0000 mg | ORAL_TABLET | Freq: Every day | ORAL | 1 refills | Status: DC

## 2022-11-11 MED ORDER — GABAPENTIN 300 MG PO CAPS: 300.0000 mg | ORAL_CAPSULE | Freq: Every day | ORAL | 1 refills | Status: DC

## 2022-11-11 NOTE — Patient Instructions (Signed)
Managing Your Hypertension Hypertension, also called high blood pressure, is when the force of the blood pressing against the walls of the arteries is too strong. Arteries are blood vessels that carry blood from your heart throughout your body. Hypertension forces the heart to work harder to pump blood and may cause the arteries to become narrow or stiff. Understanding blood pressure readings A blood pressure reading includes a higher number over a lower number: The first, or top, number is called the systolic pressure. It is a measure of the pressure in your arteries as your heart beats. The second, or bottom number, is called the diastolic pressure. It is a measure of the pressure in your arteries as the heart relaxes. For most people, a normal blood pressure is below 120/80. Your personal target blood pressure may vary depending on your medical conditions, your age, and other factors. Blood pressure is classified into four stages. Based on your blood pressure reading, your health care provider may use the following stages to determine what type of treatment you need, if any. Systolic pressure and diastolic pressure are measured in a unit called millimeters of mercury (mmHg). Normal Systolic pressure: below 120. Diastolic pressure: below 80. Elevated Systolic pressure: 120-129. Diastolic pressure: below 80. Hypertension stage 1 Systolic pressure: 130-139. Diastolic pressure: 80-89. Hypertension stage 2 Systolic pressure: 140 or above. Diastolic pressure: 90 or above. How can this condition affect me? Managing your hypertension is very important. Over time, hypertension can damage the arteries and decrease blood flow to parts of the body, including the brain, heart, and kidneys. Having untreated or uncontrolled hypertension can lead to: A heart attack. A stroke. A weakened blood vessel (aneurysm). Heart failure. Kidney damage. Eye damage. Memory and concentration problems. Vascular  dementia. What actions can I take to manage this condition? Hypertension can be managed by making lifestyle changes and possibly by taking medicines. Your health care provider will help you make a plan to bring your blood pressure within a normal range. You may be referred for counseling on a healthy diet and physical activity. Nutrition  Eat a diet that is high in fiber and potassium, and low in salt (sodium), added sugar, and fat. An example eating plan is called the DASH diet. DASH stands for Dietary Approaches to Stop Hypertension. To eat this way: Eat plenty of fresh fruits and vegetables. Try to fill one-half of your plate at each meal with fruits and vegetables. Eat whole grains, such as whole-wheat pasta, brown rice, or whole-grain bread. Fill about one-fourth of your plate with whole grains. Eat low-fat dairy products. Avoid fatty cuts of meat, processed or cured meats, and poultry with skin. Fill about one-fourth of your plate with lean proteins such as fish, chicken without skin, beans, eggs, and tofu. Avoid pre-made and processed foods. These tend to be higher in sodium, added sugar, and fat. Reduce your daily sodium intake. Many people with hypertension should eat less than 1,500 mg of sodium a day. Lifestyle  Work with your health care provider to maintain a healthy body weight or to lose weight. Ask what an ideal weight is for you. Get at least 30 minutes of exercise that causes your heart to beat faster (aerobic exercise) most days of the week. Activities may include walking, swimming, or biking. Include exercise to strengthen your muscles (resistance exercise), such as weight lifting, as part of your weekly exercise routine. Try to do these types of exercises for 30 minutes at least 3 days a week. Do   not use any products that contain nicotine or tobacco. These products include cigarettes, chewing tobacco, and vaping devices, such as e-cigarettes. If you need help quitting, ask your  health care provider. Control any long-term (chronic) conditions you have, such as high cholesterol or diabetes. Identify your sources of stress and find ways to manage stress. This may include meditation, deep breathing, or making time for fun activities. Alcohol use Do not drink alcohol if: Your health care provider tells you not to drink. You are pregnant, may be pregnant, or are planning to become pregnant. If you drink alcohol: Limit how much you have to: 0-1 drink a day for women. 0-2 drinks a day for men. Know how much alcohol is in your drink. In the [place] one drink equals one 12 oz bottle of beer (355 mL), one 5 oz glass of wine (148 mL), or one 1 oz glass of hard liquor (44 mL). Medicines Your health care provider may prescribe medicine if lifestyle changes are not enough to get your blood pressure under control and if: Your systolic blood pressure is 130 or higher. Your diastolic blood pressure is 80 or higher. Take medicines only as told by your health care provider. Follow the directions carefully. Blood pressure medicines must be taken as told by your health care provider. The medicine does not work as well when you skip doses. Skipping doses also puts you at risk for problems. Monitoring Before you monitor your blood pressure: Do not smoke, drink caffeinated beverages, or exercise within 30 minutes before taking a measurement. Use the bathroom and empty your bladder (urinate). Sit quietly for at least 5 minutes before taking measurements. Monitor your blood pressure at home as told by your health care provider. To do this: Sit with your back straight and supported. Place your feet flat on the floor. Do not cross your legs. Support your arm on a flat surface, such as a table. Make sure your upper arm is at heart level. Each time you measure, take two or three readings one minute apart and record the results. You may also need to have your blood pressure checked regularly by  your health care provider. General information Talk with your health care provider about your diet, exercise habits, and other lifestyle factors that may be contributing to hypertension. Review all the medicines you take with your health care provider because there may be side effects or interactions. Keep all follow-up visits. Your health care provider can help you create and adjust your plan for managing your high blood pressure. Where to find more information National Heart, Lung, and Blood Institute: www.nhlbi.nih.gov American Heart Association: www.heart.org Contact a health care provider if: You think you are having a reaction to medicines you have taken. You have repeated (recurrent) headaches. You feel dizzy. You have swelling in your ankles. You have trouble with your vision. Get help right away if: You develop a severe headache or confusion. You have unusual weakness or numbness, or you feel faint. You have severe pain in your chest or abdomen. You vomit repeatedly. You have trouble breathing. These symptoms may be an emergency. Get help right away. Call 911. Do not wait to see if the symptoms will go away. Do not drive yourself to the hospital. Summary Hypertension is when the force of blood pumping through your arteries is too strong. If this condition is not controlled, it may put you at risk for serious complications. Your personal target blood pressure may vary depending on your medical conditions,   your age, and other factors. For most people, a normal blood pressure is less than 120/80. Hypertension is managed by lifestyle changes, medicines, or both. Lifestyle changes to help manage hypertension include losing weight, eating a healthy, low-sodium diet, exercising more, stopping smoking, and limiting alcohol. This information is not intended to replace advice given to you by your health care provider. Make sure you discuss any questions you have with your health care  provider. Document Revised: 04/12/2021 Document Reviewed: 04/12/2021 Elsevier Patient Education  2023 Elsevier Inc.  

## 2022-11-11 NOTE — Progress Notes (Signed)
Not taking BP medication due to side effects.

## 2022-11-11 NOTE — Progress Notes (Signed)
Subjective:  Patient ID: Marcia Jackson, female    DOB: 09-16-1990  Age: 32 y.o. MRN: [id number]  CC: Hypertension   HPI Marcia Jackson is a 32 y.o. year old female with a history of hypertension, alcoholic peripheral neuropathy seen for an office visit.  Interval History:  She had side effects with her antihypertensive as her Amlodipine made her feel weird. She felt 'crawling in her skin.'  Symptoms resolved after she discontinued amlodipine. She drinks alcohol once/ weekend and has cut back significantly. Gabapentin has helped her neuropathy.  Past Medical History:  Diagnosis Date   Depression    History of heavy alcohol consumption     Past Surgical History:  Procedure Laterality Date   WISDOM TOOTH EXTRACTION      Family History  Problem Relation Age of Onset   Diabetes Maternal Aunt    Cancer Maternal Grandmother    Diabetes Maternal Grandmother    Healthy Mother    Healthy Father     Social History   Socioeconomic History   Marital status: Single    Spouse name: Not on file   Number of children: 2   Years of education: 10   Highest education level: Not on file  Occupational History   Occupation: [employer]  Tobacco Use   Smoking status: Former    Packs/day: .15    Types: Cigarettes   Smokeless tobacco: Never  Vaping Use   Vaping Use: Some days  Substance and Sexual Activity   Alcohol use: Yes    Alcohol/week: 4.0 standard drinks of alcohol    Types: 4 Standard drinks or equivalent per week    Comment: No longer drinks straight liquor per pt   Drug use: No   Sexual activity: Yes    Birth control/protection: Implant  Other Topics Concern   Not on file  Social History Narrative   2 children   Two story house   Works at [employer]   Right handed   Social Determinants of Health   Financial Resource Strain: Not on file  Food Insecurity: Not on file  Transportation Needs: Not on file  Physical Activity: Not on file  Stress: Not on file  Social  Connections: Not on file    No Known Allergies  Outpatient Medications Prior to Visit  Medication Sig Dispense Refill   folic acid (FOLVITE) 1 MG tablet Take 1 tablet (1 mg total) by mouth daily. 30 tablet 3   gabapentin (NEURONTIN) 300 MG capsule Take 1 capsule (300 mg total) by mouth at bedtime. 30 capsule 3   hydrOXYzine (ATARAX) 25 MG tablet Take 1 tablet (25 mg total) by mouth 3 (three) times daily as needed for anxiety. (Patient not taking: Reported on 11/11/2022) 60 tablet 1   thiamine (VITAMIN B1) 100 MG tablet Take 1 tablet (100 mg total) by mouth daily. (Patient not taking: Reported on 11/11/2022) 30 tablet 3   amLODipine (NORVASC) 2.5 MG tablet TAKE 1 TABLET(2.5 MG) BY MOUTH DAILY (Patient not taking: Reported on 11/11/2022) 30 tablet 3   benzonatate (TESSALON) 100 MG capsule Take 1 capsule (100 mg total) by mouth every 8 (eight) hours. (Patient not taking: Reported on 11/11/2022) 21 capsule 0   cetirizine (ZYRTEC ALLERGY) 10 MG tablet Take 1 tablet (10 mg total) by mouth daily. (Patient not taking: Reported on 11/11/2022) 30 tablet 0   fluticasone (FLONASE) 50 MCG/ACT nasal spray Place 2 sprays into both nostrils daily. (Patient not taking: Reported on 11/11/2022) 11.1 mL 0  naltrexone (DEPADE) 50 MG tablet Take 1 tablet (50 mg total) by mouth daily. (Patient not taking: Reported on 11/11/2022) 30 tablet 3   naproxen (NAPROSYN) 500 MG tablet Take 1 tablet (500 mg total) by mouth 2 (two) times daily. (Patient not taking: Reported on 11/11/2022) 30 tablet 0   No facility-administered medications prior to visit.     ROS Review of Systems  Constitutional:  Negative for activity change and appetite change.  HENT:  Negative for sinus pressure and sore throat.   Respiratory:  Negative for chest tightness, shortness of breath and wheezing.   Cardiovascular:  Negative for chest pain and palpitations.  Gastrointestinal:  Negative for abdominal distention, abdominal pain and constipation.   Genitourinary: Negative.   Musculoskeletal: Negative.   Psychiatric/Behavioral:  Negative for behavioral problems and dysphoric mood.     Objective:  BP 131/85   Pulse 79   Ht 5\' 2"  (1.575 m)   Wt 114 lb 3.2 oz (51.8 kg)   SpO2 100%   BMI 20.89 kg/m      11/11/2022    3:47 PM 08/18/2022   11:47 AM 07/21/2022    5:30 PM  BP/Weight  Systolic BP [id number] [id number]  Diastolic BP 85 90 70  Wt. (Lbs) 114.2    BMI 20.89 kg/m2        Physical Exam Constitutional:      Appearance: She is well-developed.  Cardiovascular:     Rate and Rhythm: Normal rate.     Heart sounds: Normal heart sounds. No murmur heard. Pulmonary:     Effort: Pulmonary effort is normal.     Breath sounds: Normal breath sounds. No wheezing or rales.  Chest:     Chest wall: No tenderness.  Abdominal:     General: Bowel sounds are normal. There is no distension.     Palpations: Abdomen is soft. There is no mass.     Tenderness: There is no abdominal tenderness.  Musculoskeletal:        General: Normal range of motion.     Right lower leg: No edema.     Left lower leg: No edema.  Neurological:     Mental Status: She is alert and oriented to person, place, and time.  Psychiatric:        Mood and Affect: Mood normal.        Latest Ref Rng & Units 07/21/2022    2:38 PM 05/29/2022    2:25 PM 05/14/2019   10:28 AM  CMP  Glucose 70 - 99 mg/dL 100  81  121   BUN 6 - 20 mg/dL 7  10  7    Creatinine 0.44 - 1.00 mg/dL 0.52  0.62  0.68   Sodium 135 - 145 mmol/L 142  142  142   Potassium 3.5 - 5.1 mmol/L 3.5  3.9  3.8   Chloride 98 - 111 mmol/L 105  104  105   CO2 22 - 32 mmol/L 24  24  20    Calcium 8.9 - 10.3 mg/dL 9.0  9.5  9.9   Total Protein 6.0 - 8.5 g/dL  7.2    Total Bilirubin 0.0 - 1.2 mg/dL  0.8    Alkaline Phos 44 - 121 IU/L  112    AST 0 - 40 IU/L  38    ALT 0 - 32 IU/L  26      Lipid Panel  No results found for: "CHOL", "TRIG", "HDL", "CHOLHDL", "VLDL", "LDLCALC", "LDLDIRECT"  CBC  Component Value Date/Time   WBC 12.6 (H) 07/21/2022 1438   RBC 4.63 07/21/2022 1438   HGB 14.9 07/21/2022 1438   HGB 14.3 05/29/2022 1425   HCT 42.7 07/21/2022 1438   HCT 41.4 05/29/2022 1425   PLT 289 07/21/2022 1438   PLT 245 05/29/2022 1425   MCV 92.2 07/21/2022 1438   MCV 92 05/29/2022 1425   MCH 32.2 07/21/2022 1438   MCHC 34.9 07/21/2022 1438   RDW 13.2 07/21/2022 1438   RDW 11.9 05/29/2022 1425   LYMPHSABS 0.3 (L) 07/21/2022 1438   LYMPHSABS 1.7 05/29/2022 1425   MONOABS 0.7 07/21/2022 1438   EOSABS 0.0 07/21/2022 1438   EOSABS 0.1 05/29/2022 1425   BASOSABS 0.1 07/21/2022 1438   BASOSABS 0.1 05/29/2022 1425    Lab Results  Component Value Date   HGBA1C 4.9 05/29/2022    Assessment & Plan:  1. Alcoholic peripheral neuropathy Controlled She was prescribed naltrexone but never took it due to the side effects which she read about Commended on cutting down on alcohol - gabapentin (NEURONTIN) 300 MG capsule; Take 1 capsule (300 mg total) by mouth at bedtime.  Dispense: 90 capsule; Refill: 1 - folic acid (FOLVITE) 1 MG tablet; Take 1 tablet (1 mg total) by mouth daily.  Dispense: 90 tablet; Refill: 1  2. Primary hypertension Unable to tolerate amlodipine hence have discontinued this She will work on lifestyle modifications and monitor her blood pressures at home. Counseled on blood pressure goal of less than 130/80, low-sodium, DASH diet, medication compliance, 150 minutes of moderate intensity exercise per week. Discussed medication compliance, adverse effects.     Meds ordered this encounter  Medications   gabapentin (NEURONTIN) 300 MG capsule    Sig: Take 1 capsule (300 mg total) by mouth at bedtime.    Dispense:  90 capsule    Refill:  1   folic acid (FOLVITE) 1 MG tablet    Sig: Take 1 tablet (1 mg total) by mouth daily.    Dispense:  90 tablet    Refill:  1    Follow-up: Return in about 1 month (around 12/11/2022) for Pap smear.       Charlott Rakes, MD, FAAFP. [facility] and [facility] [place], [place]   11/11/2022, 4:30 PM

## 2022-12-16 ENCOUNTER — Ambulatory Visit: Payer: Medicaid Other | Admitting: Family Medicine

## 2023-02-26 ENCOUNTER — Ambulatory Visit: Payer: BLUE CROSS/BLUE SHIELD | Attending: Family Medicine | Admitting: Family Medicine

## 2023-02-26 ENCOUNTER — Encounter: Payer: Self-pay | Admitting: Family Medicine

## 2023-02-26 VITALS — BP 126/83 | HR 70 | Temp 99.1°F | Ht 62.0 in | Wt 116.4 lb

## 2023-02-26 DIAGNOSIS — G621 Alcoholic polyneuropathy: Secondary | ICD-10-CM | POA: Diagnosis not present

## 2023-02-26 DIAGNOSIS — I10 Essential (primary) hypertension: Secondary | ICD-10-CM | POA: Diagnosis not present

## 2023-02-26 DIAGNOSIS — M549 Dorsalgia, unspecified: Secondary | ICD-10-CM | POA: Diagnosis not present

## 2023-02-26 MED ORDER — GABAPENTIN 300 MG PO CAPS: 300.0000 mg | ORAL_CAPSULE | Freq: Every day | ORAL | 1 refills | Status: DC

## 2023-02-26 NOTE — Patient Instructions (Signed)
Alcohol Use Disorder Alcohol use disorder is a condition in which drinking disrupts daily life. People with this condition drink too much alcohol and cannot control their drinking. Alcohol use disorder can cause serious problems with physical health. It can affect the brain, heart, and other internal organs. This disorder can raise the risk for certain cancers and cause problems with mental health, such as depression or anxiety. What are the causes? This condition is caused by drinking too much alcohol over time. Some people with this condition drink to cope with or escape from negative life events. Others drink to relieve symptoms of physical pain or symptoms of mental illness. What increases the risk? You are more likely to develop this condition if: You have a family history of alcohol use disorder. Your culture encourages drinking to the point of becoming drunk (intoxication). You had a mood or conduct disorder in childhood. You have been abused. You are an adolescent and you: Have poor performance in school. Have poor supervision or guidance. Act on impulse and like taking risks. What are the signs or symptoms? Symptoms of this condition include: Drinking more than you want to. Trying several times without success to drink less. Spending a lot of time thinking about alcohol, getting alcohol, drinking alcohol, or recovering from drinking alcohol. Continuing to drink even when it is causing serious problems in your daily life. Drinking when it is dangerous to drink, such as before driving a car. Needing more and more alcohol to get the same effect you want (building up tolerance). Having symptoms of withdrawal when you stop drinking. Withdrawal symptoms may include: Trouble sleeping, leading to tiredness (fatigue). Mood swings of depression and anxiety. Physical symptoms, such as a fast heart rate, rapid breathing, high blood pressure (hypertension), fever, cold sweats, or  nausea. Seizures. Severe confusion. Feeling or seeing things that are not there (hallucinations). Shaking movements that you cannot control (tremors). How is this diagnosed? This condition is diagnosed with an assessment. Your health care provider may start by asking three or four questions about your drinking, or they may give you a simple test to take. This helps to get clear information from you. You may also have a physical exam or lab tests. You may be referred to a substance abuse counselor. How is this treated? With education, some people with alcohol use disorder are able to reduce their drinking. Many with this disorder cannot change their drinking behavior on their own and need help with treatment from substance use specialists. Treatments may include: Detoxification. Detoxification involves quitting drinking with supervision and direction of health care providers. Your health care provider may prescribe medicines within the first week to help lessen withdrawal symptoms. Alcohol withdrawal can be dangerous and life-threatening. Detoxification may be provided in a home, community, or primary care setting, or in a hospital or substance use treatment facility. Counseling. This may involve motivational interviewing (MI), family therapy, or cognitive behavioral therapy (CBT). A counselor can address the things you can do to change your drinking behavior and how to maintain the changes. Talk therapy aims to: Identify your positive motivations to change. Identify and avoid the things that trigger your drinking. Help you learn how to plan your behavior change. Develop support systems that can help you sustain the change. Medicines. Medicines can help treat this disorder by: Decreasing cravings. Decreasing the positive feeling you have when you drink. Causing an uncomfortable physical reaction when you drink (aversion therapy). Support groups such as Alcoholics Anonymous (AA). These groups are  led by people who have quit drinking. The groups provide emotional support, advice, and guidance. Some people with this condition benefit from a combination of treatments provided by specialized substance use treatment centers. Follow these instructions at home:  Medicines Take over-the-counter and prescription medicines only as told by your health care provider. Ask before starting any new medicines, herbs, or supplements. General instructions Ask friends and family members to support your choice to stay sober. Avoid places where alcohol is served. Create a plan to deal with tempting situations. Attend support groups regularly. Practice hobbies or activities you enjoy. Do not drink and drive. How is this prevented? Do not drink alcohol if your health care provider tells you not to drink. If you drink alcohol: Limit how much you have to: 0-1 drink a day for women who are not pregnant. 0-2 drinks a day for men. Know how much alcohol is in your drink. In the [place] one drink equals one 12 oz bottle of beer (355 mL), one 5 oz glass of wine (148 mL), or one 1 oz glass of hard liquor (44 mL). If you have a mental health condition, seek treatment. Develop a healthy lifestyle through: Meditation or deep breathing. Exercise. Spending time in nature. Listening to music. Talking with a trusted friend or family member. If you are a teen: Do not drink alcohol. Avoid gatherings where you might be tempted to drink alcohol. Do not be afraid to say no if someone offers you alcohol. Speak up about why you do not want to drink. Set a positive example for others around you by not drinking. Build relationships with friends who do not drink. Where to find more information Substance Abuse and Mental Health Services Administration: RockToxic.pl Alcoholics Anonymous: CustomizedRugs.fi Contact a health care provider if: You cannot take your medicines as told. Your symptoms get worse or you experience symptoms of  withdrawal when you stop drinking. You start drinking again (relapse) and your symptoms get worse. Get help right away if: You have thoughts about hurting yourself or others. Get help right away if you feel like you may hurt yourself or others, or have thoughts about taking your own life. Go to your nearest emergency room or: Call 911. Call the National Suicide Prevention Lifeline at 385-054-0790 or 988. This is open 24 hours a day. Text the Crisis Text Line at 6102983571. Summary Alcohol use disorder is a condition in which drinking disrupts daily life. People with this condition drink too much alcohol and cannot control their drinking. Treatment may include detoxification, counseling, medicines, and support groups. Ask friends and family members to support you. Avoid situations where alcohol is served. Get help right away if you have thoughts about hurting yourself or others. This information is not intended to replace advice given to you by your health care provider. Make sure you discuss any questions you have with your health care provider. Document Revised: 10/03/2021 Document Reviewed: 10/03/2021 Elsevier Patient Education  2024 [employer].

## 2023-02-26 NOTE — Progress Notes (Signed)
Subjective:  Patient ID: Marcia Jackson, female    DOB: Nov 04, 1990  Age: 32 y.o. MRN: [id number]  CC: Hypertension   HPI Marcia Jackson is a 32 y.o. year old female with a history of hypertension, alcoholic peripheral neuropathy seen for an office visit.   Interval History: Discussed the use of AI scribe software for clinical note transcription with the patient, who gave verbal consent to proceed.  She presents for follow-up. She had previously been on amlodipine for hypertension, but it was discontinued due to intolerance. She has since been managing her blood pressure with lifestyle modifications, including a low sodium diet. She reports that her blood pressure readings at home have been satisfactory, with the highest reading in the last month being 135.  The patient also has neuropathy, which she manages with gabapentin. She reports that the medication has been effective in controlling her symptoms, which include numbness in her hands, feet, and occasionally her face. The patient admits to consuming alcohol twice a week, despite being advised that alcohol could be contributing to her neuropathy.  In addition to her primary complaints, the patient reports experiencing mild back pain when lying down or sleeping on her back. This has been an ongoing issue for the past six years, since the birth of her last child.        Past Medical History:  Diagnosis Date   Depression    History of heavy alcohol consumption     Past Surgical History:  Procedure Laterality Date   WISDOM TOOTH EXTRACTION      Family History  Problem Relation Age of Onset   Diabetes Maternal Aunt    Cancer Maternal Grandmother    Diabetes Maternal Grandmother    Healthy Mother    Healthy Father     Social History   Socioeconomic History   Marital status: Single    Spouse name: Not on file   Number of children: 2   Years of education: 10   Highest education level: Not on file  Occupational  History   Occupation: [employer]  Tobacco Use   Smoking status: Former    Current packs/day: 0.15    Types: Cigarettes   Smokeless tobacco: Never  Vaping Use   Vaping status: Some Days  Substance and Sexual Activity   Alcohol use: Yes    Alcohol/week: 4.0 standard drinks of alcohol    Types: 4 Standard drinks or equivalent per week    Comment: No longer drinks straight liquor per pt   Drug use: No   Sexual activity: Yes    Birth control/protection: Implant  Other Topics Concern   Not on file  Social History Narrative   2 children   Two story house   Works at [employer]   Right handed   Social Determinants of Health   Financial Resource Strain: Not on file  Food Insecurity: Not on file  Transportation Needs: Not on file  Physical Activity: Not on file  Stress: Not on file  Social Connections: Not on file    No Known Allergies  Outpatient Medications Prior to Visit  Medication Sig Dispense Refill   folic acid (FOLVITE) 1 MG tablet Take 1 tablet (1 mg total) by mouth daily. 90 tablet 1   gabapentin (NEURONTIN) 300 MG capsule Take 1 capsule (300 mg total) by mouth at bedtime. 90 capsule 1   hydrOXYzine (ATARAX) 25 MG tablet Take 1 tablet (25 mg total) by mouth 3 (three) times daily as needed for anxiety. (  Patient not taking: Reported on 11/11/2022) 60 tablet 1   thiamine (VITAMIN B1) 100 MG tablet Take 1 tablet (100 mg total) by mouth daily. (Patient not taking: Reported on 11/11/2022) 30 tablet 3   No facility-administered medications prior to visit.     ROS Review of Systems  Constitutional:  Negative for activity change and appetite change.  HENT:  Negative for sinus pressure and sore throat.   Respiratory:  Negative for chest tightness, shortness of breath and wheezing.   Cardiovascular:  Negative for chest pain and palpitations.  Gastrointestinal:  Negative for abdominal distention, abdominal pain and constipation.  Genitourinary: Negative.   Musculoskeletal:  Positive for  back pain.  Psychiatric/Behavioral:  Negative for behavioral problems and dysphoric mood.     Objective:  BP 126/83   Pulse 70   Temp 99.1 F (37.3 C) (Oral)   Ht 5\' 2"  (1.575 m)   Wt 116 lb 6.4 oz (52.8 kg)   SpO2 100%   BMI 21.29 kg/m      02/26/2023    2:20 PM 11/11/2022    3:47 PM 08/18/2022   11:47 AM  BP/Weight  Systolic BP 126 131 137  Diastolic BP 83 85 90  Wt. (Lbs) 116.4 114.2   BMI 21.29 kg/m2 20.89 kg/m2       Physical Exam Constitutional:      Appearance: She is well-developed.  Cardiovascular:     Rate and Rhythm: Normal rate.     Heart sounds: Normal heart sounds. No murmur heard. Pulmonary:     Effort: Pulmonary effort is normal.     Breath sounds: Normal breath sounds. No wheezing or rales.  Chest:     Chest wall: No tenderness.  Abdominal:     General: Bowel sounds are normal. There is no distension.     Palpations: Abdomen is soft. There is no mass.     Tenderness: There is no abdominal tenderness.  Musculoskeletal:        General: Normal range of motion.     Right lower leg: No edema.     Left lower leg: No edema.     Comments: Negative straight leg raise bilaterally  Neurological:     Mental Status: She is alert and oriented to person, place, and time.  Psychiatric:        Mood and Affect: Mood normal.        Latest Ref Rng & Units 07/21/2022    2:38 PM 05/29/2022    2:25 PM 05/14/2019   10:28 AM  CMP  Glucose 70 - 99 mg/dL 161  81  096   BUN 6 - 20 mg/dL 7  10  7    Creatinine 0.44 - 1.00 mg/dL 0.45  4.09  8.11   Sodium 135 - 145 mmol/L 142  142  142   Potassium 3.5 - 5.1 mmol/L 3.5  3.9  3.8   Chloride 98 - 111 mmol/L 105  104  105   CO2 22 - 32 mmol/L 24  24  20    Calcium 8.9 - 10.3 mg/dL 9.0  9.5  9.9   Total Protein 6.0 - 8.5 g/dL  7.2    Total Bilirubin 0.0 - 1.2 mg/dL  0.8    Alkaline Phos 44 - 121 IU/L  112    AST 0 - 40 IU/L  38    ALT 0 - 32 IU/L  26      Lipid Panel  No results found for: "CHOL", "TRIG", "HDL",  "CHOLHDL", "  VLDL", "LDLCALC", "LDLDIRECT"  CBC    Component Value Date/Time   WBC 12.6 (H) 07/21/2022 1438   RBC 4.63 07/21/2022 1438   HGB 14.9 07/21/2022 1438   HGB 14.3 05/29/2022 1425   HCT 42.7 07/21/2022 1438   HCT 41.4 05/29/2022 1425   PLT 289 07/21/2022 1438   PLT 245 05/29/2022 1425   MCV 92.2 07/21/2022 1438   MCV 92 05/29/2022 1425   MCH 32.2 07/21/2022 1438   MCHC 34.9 07/21/2022 1438   RDW 13.2 07/21/2022 1438   RDW 11.9 05/29/2022 1425   LYMPHSABS 0.3 (L) 07/21/2022 1438   LYMPHSABS 1.7 05/29/2022 1425   MONOABS 0.7 07/21/2022 1438   EOSABS 0.0 07/21/2022 1438   EOSABS 0.1 05/29/2022 1425   BASOSABS 0.1 07/21/2022 1438   BASOSABS 0.1 05/29/2022 1425    Lab Results  Component Value Date   HGBA1C 4.9 05/29/2022    Assessment & Plan:      Hypertension:  -Well controlled with lifestyle modifications including low sodium diet. No need for antihypertensive medications at this time. -Continue low sodium diet and lifestyle modifications. -Check blood pressure intermittently at home.  Alcohol-induced Peripheral Neuropathy:  -Improvement with Gabapentin. Continued alcohol consumption. -Continue Gabapentin for neuropathy. -Encouraged to reduce alcohol consumption to prevent further nerve damage.  Chronic Back Pain:  -Mild pain, worse when lying down. No acute distress on examination. -Use heating pad and Lidoderm patches as needed. -Consider over-the-counter pain medications (Aleve, ibuprofen, Tylenol) as needed. -Consider yoga or other stretching exercises for pain relief.  Preventive Care: Overdue for Pap smear. -Schedule Pap smear in one month.          Meds ordered this encounter  Medications   gabapentin (NEURONTIN) 300 MG capsule    Sig: Take 1 capsule (300 mg total) by mouth at bedtime.    Dispense:  90 capsule    Refill:  1    Follow-up: Return in about 1 month (around 03/29/2023) for Pap smear.       Hoy Register, MD,  FAAFP. [facility] [place], [place] [phone]   02/26/2023, 2:36 PM

## 2023-04-04 ENCOUNTER — Ambulatory Visit (HOSPITAL_COMMUNITY)
Admission: EM | Admit: 2023-04-04 | Discharge: 2023-04-04 | Disposition: A | Discharge status: Home / Self Care | Payer: BLUE CROSS/BLUE SHIELD | Attending: Emergency Medicine | Admitting: Emergency Medicine

## 2023-04-04 ENCOUNTER — Encounter (HOSPITAL_COMMUNITY): Payer: Self-pay

## 2023-04-04 DIAGNOSIS — M5432 Sciatica, left side: Secondary | ICD-10-CM | POA: Diagnosis not present

## 2023-04-04 MED ORDER — KETOROLAC TROMETHAMINE 30 MG/ML IJ SOLN
INTRAMUSCULAR | Status: AC
  Filled 2023-04-04: qty 1

## 2023-04-04 MED ORDER — KETOROLAC TROMETHAMINE 30 MG/ML IJ SOLN
30.0000 mg | Freq: Once | INTRAMUSCULAR | Status: AC
  Administered 2023-04-04: 30 mg via INTRAMUSCULAR

## 2023-04-04 MED ORDER — IBUPROFEN 800 MG PO TABS: 800.0000 mg | ORAL_TABLET | Freq: Three times a day (TID) | ORAL | 0 refills | Status: DC

## 2023-04-04 NOTE — ED Provider Notes (Signed)
[facility]    CSN: [id number] Arrival date & time: 04/04/23  1030      History   Chief Complaint Chief Complaint  Patient presents with   Back Pain    HPI Marcia Jackson is a 32 y.o. female.  Here with left low back pain that radiates into left glute Denies injury or trauma, although works in [employer]. Lots of lifting and bending. She has history of back pain but not sciatica No numbness/tingling or weakness in the extremities. Normal bladder and bowel functions  Has not taken any medications, no interventions yet  Past Medical History:  Diagnosis Date   Depression    History of heavy alcohol consumption     Patient Active Problem List   Diagnosis Date Noted   Primary hypertension 05/29/2022   Alcoholic peripheral neuropathy (HCC) 05/29/2022   Encounter for other contraceptive management 02/03/2017   Rh negative, antepartum 05/17/2016   Rubella non-immune status, antepartum 05/17/2016   Family history of congenital heart defect 05/07/2016    Past Surgical History:  Procedure Laterality Date   WISDOM TOOTH EXTRACTION      OB History     Gravida  2   Para  2   Term  2   Preterm      AB      Living  2      SAB      IAB      Ectopic      Multiple  0   Live Births  2            Home Medications    Prior to Admission medications   Medication Sig Start Date End Date Taking? Authorizing Provider  ibuprofen (ADVIL) 800 MG tablet Take 1 tablet (800 mg total) by mouth 3 (three) times daily. 04/04/23  Yes Aramis Zobel, Lurena Joiner, PA-C  folic acid (FOLVITE) 1 MG tablet Take 1 tablet (1 mg total) by mouth daily. 11/11/22   Hoy Register, MD  gabapentin (NEURONTIN) 300 MG capsule Take 1 capsule (300 mg total) by mouth at bedtime. 02/26/23   Hoy Register, MD  famotidine (PEPCID) 20 MG tablet Take 1 tablet (20 mg total) by mouth 2 (two) times daily. Patient not taking: Reported on 05/12/2019 04/28/19 05/19/20  Renne Crigler, PA-C   potassium chloride SA (K-DUR) 20 MEQ tablet Take 1 tablet (20 mEq total) by mouth 2 (two) times daily for 4 days. Patient not taking: Reported on 05/12/2019 04/22/19 05/19/20  Pricilla Loveless, MD  promethazine (PHENERGAN) 25 MG tablet Take 1 tablet (25 mg total) by mouth every 6 (six) hours as needed for nausea or vomiting. Patient not taking: Reported on 05/12/2019 03/22/19 05/19/20  Derwood Kaplan, MD    Family History Family History  Problem Relation Age of Onset   Diabetes Maternal Aunt    Cancer Maternal Grandmother    Diabetes Maternal Grandmother    Healthy Mother    Healthy Father     Social History Social History   Tobacco Use   Smoking status: Former    Current packs/day: 0.15    Types: Cigarettes   Smokeless tobacco: Never  Vaping Use   Vaping status: Some Days  Substance Use Topics   Alcohol use: Yes    Alcohol/week: 4.0 standard drinks of alcohol    Types: 4 Standard drinks or equivalent per week    Comment: No longer drinks straight liquor per pt   Drug use: No     Allergies   Patient  has no known allergies.   Review of Systems Review of Systems As per HPI  Physical Exam Triage Vital Signs ED Triage Vitals [04/04/23 1155]  Encounter Vitals Group     BP 118/70     Systolic BP Percentile      Diastolic BP Percentile      Pulse Rate 77     Resp 16     Temp 98.8 F (37.1 C)     Temp src      SpO2 98 %     Weight      Height      Head Circumference      Peak Flow      Pain Score 8     Pain Loc      Pain Education      Exclude from Growth Chart    No data found.  Updated Vital Signs BP 118/70 (BP Location: Right Arm)   Pulse 77   Temp 98.8 F (37.1 C)   Resp 16   LMP 04/01/2023   SpO2 98%   Physical Exam Vitals and nursing note reviewed.  Constitutional:      General: She is not in acute distress. HENT:     Mouth/Throat:     Mouth: Mucous membranes are moist.     Pharynx: Oropharynx is clear.  Eyes:     Extraocular Movements:  Extraocular movements intact.     Conjunctiva/sclera: Conjunctivae normal.     Pupils: Pupils are equal, round, and reactive to light.  Cardiovascular:     Rate and Rhythm: Normal rate and regular rhythm.     Heart sounds: Normal heart sounds.  Pulmonary:     Effort: Pulmonary effort is normal.     Breath sounds: Normal breath sounds.  Musculoskeletal:     Cervical back: Normal range of motion.     Lumbar back: Positive left straight leg raise test.     Comments: No bony tenderness C-L spine  +L SLR  Skin:    General: Skin is warm and dry.  Neurological:     General: No focal deficit present.     Mental Status: She is alert and oriented to person, place, and time.     Cranial Nerves: Cranial nerves 2-12 are intact.     Sensory: Sensation is intact.     Motor: Motor function is intact. No weakness.     Coordination: Coordination is intact.     Gait: Gait is intact.     Deep Tendon Reflexes: Reflexes are normal and symmetric.     Comments: Strength 5/5. Sensation intact     UC Treatments / Results  Labs (all labs ordered are listed, but only abnormal results are displayed) Labs Reviewed - No data to display  EKG   Radiology No results found.  Procedures Procedures (including critical care time)  Medications Ordered in UC Medications  ketorolac (TORADOL) 30 MG/ML injection 30 mg (30 mg Intramuscular Given 04/04/23 1310)    Initial Impression / Assessment and Plan / UC Course  I have reviewed the triage vital signs and the nursing notes.  Pertinent labs & imaging results that were available during my care of the patient were reviewed by me and considered in my medical decision making (see chart for details).  Stable vitals Neurologically intact Sciatica of left side Offered toradol IM in clinic. Recommend tomorrow using ibuprofen.  Discussed symptomatic care with ice/heat, rest, stretching, massage. Offered muscle relaxer but patient reports it makes her feel  weird. Return precautions Work note provided   Final Clinical Impressions(s) / UC Diagnoses   Final diagnoses:  Sciatica of left side     Discharge Instructions      The injection can take about 30 minutes to start working Tomorrow you can begin taking ibuprofen for pain Apply hot pad or ice to the low back Try gentle stretching and light massage    ED Prescriptions     Medication Sig Dispense Auth. Provider   ibuprofen (ADVIL) 800 MG tablet Take 1 tablet (800 mg total) by mouth 3 (three) times daily. 21 tablet Jagjit Riner, Lurena Joiner, PA-C      PDMP not reviewed this encounter.   Paz Winsett, Lurena Joiner, [place] 04/04/23 1507

## 2023-04-04 NOTE — Discharge Instructions (Signed)
The injection can take about 30 minutes to start working Tomorrow you can begin taking ibuprofen for pain Apply hot pad or ice to the low back Try gentle stretching and light massage

## 2023-04-04 NOTE — ED Triage Notes (Signed)
Pt c/o lt lower back/buttocks pain radiating down lt leg since yesterday. Denies injury or taking meds.

## 2023-04-07 ENCOUNTER — Other Ambulatory Visit (HOSPITAL_COMMUNITY)
Admission: RE | Admit: 2023-04-07 | Discharge: 2023-04-07 | Disposition: A | Discharge status: Home / Self Care | Payer: BLUE CROSS/BLUE SHIELD | Source: Ambulatory Visit

## 2023-04-07 ENCOUNTER — Ambulatory Visit: Payer: BLUE CROSS/BLUE SHIELD | Attending: Family Medicine | Admitting: Family Medicine

## 2023-04-07 VITALS — BP 134/84 | HR 67 | Ht 62.0 in | Wt 117.8 lb

## 2023-04-07 DIAGNOSIS — Z124 Encounter for screening for malignant neoplasm of cervix: Secondary | ICD-10-CM | POA: Insufficient documentation

## 2023-04-07 DIAGNOSIS — Z01419 Encounter for gynecological examination (general) (routine) without abnormal findings: Secondary | ICD-10-CM

## 2023-04-07 NOTE — Patient Instructions (Signed)
Pap Test Why am I having this test? A Pap test, also called a Pap smear, is a screening test to check for signs of: Infection. Cancer of the cervix. The cervix is the lower part of the uterus that opens into the vagina. Changes that may be a sign that cancer is developing (precancerous changes). Women need this test on a regular basis. In general, you should have a Pap test every 3 years until you reach menopause or age 31. Women aged 30-60 may choose to have their Pap test done at the same time as an HPV (human papillomavirus) test every 5 years (instead of every 3 years). Your health care provider may recommend having Pap tests more or less often depending on your medical conditions and past Pap test results. What is being tested? Cervical cells are tested for signs of infection or abnormalities. What kind of sample is taken?  Your health care provider will collect a sample of cells from the surface of your cervix. This will be done using a small cotton swab, plastic spatula, or brush that is inserted into your vagina using a tool called a speculum. This sample is often collected during a pelvic exam, when you are lying on your back on an exam table with your feet in footrests (stirrups). In some cases, fluids (secretions) from the cervix or vagina may also be collected. How do I prepare for this test? Be aware of where you are in your menstrual cycle. If you are menstruating on the day of the test, you may be asked to reschedule. You may need to reschedule if you have a known vaginal infection on the day of the test. Follow instructions from your health care provider about: Changing or stopping your regular medicines. Some medicines can cause abnormal test results, such as vaginal medicines and tetracycline. Avoiding douching 2-3 days before or the day of the test. Tell a health care provider about: Any allergies you have. All medicines you are taking, including vitamins, herbs, eye drops,  creams, and over-the-counter medicines. Any bleeding problems you have. Any surgeries you have had. Any medical conditions you have. Whether you are pregnant or may be pregnant. How are the results reported? Your test results will be reported as either abnormal or normal. What do the results mean? A normal test result means that you do not have signs of cancer of the cervix. An abnormal result may mean that you have: Cancer. A Pap test by itself is not enough to diagnose cancer. You will have more tests done if cancer is suspected. Precancerous changes in your cervix. Inflammation of the cervix. An STI (sexually transmitted infection). A fungal infection. A parasite infection. Talk with your health care provider about what your results mean. In some cases, your health care provider may do more testing to confirm the results. Questions to ask your health care provider Ask your health care provider, or the department that is doing the test: When will my results be ready? How will I get my results? What are my treatment options? What other tests do I need? What are my next steps? Summary In general, women should have a Pap test every 3 years until they reach menopause or age 31. Your health care provider will collect a sample of cells from the surface of your cervix. This will be done using a small cotton swab, plastic spatula, or brush. In some cases, fluids (secretions) from the cervix or vagina may also be collected. This information is not   intended to replace advice given to you by your health care provider. Make sure you discuss any questions you have with your health care provider. Document Revised: 10/27/2020 Document Reviewed: 10/27/2020 Elsevier Patient Education  2024 [employer]  

## 2023-04-07 NOTE — Progress Notes (Signed)
Subjective:  Patient ID: Marcia Jackson, female    DOB: 14-Mar-1991  Age: 32 y.o. MRN: [id number]  CC: Gynecologic Exam   HPI LOLLY Jackson is a 32 y.o. year old female with a history of hypertension (diet-controlled), alcoholic peripheral neuropathy.  Interval History:    She presents today for a gynecological exam. She is due for Pap smear. Denies plans for additional concerns.   Past Medical History:  Diagnosis Date   Depression    History of heavy alcohol consumption     Past Surgical History:  Procedure Laterality Date   WISDOM TOOTH EXTRACTION      Family History  Problem Relation Age of Onset   Diabetes Maternal Aunt    Cancer Maternal Grandmother    Diabetes Maternal Grandmother    Healthy Mother    Healthy Father     Social History   Socioeconomic History   Marital status: Single    Spouse name: Not on file   Number of children: 2   Years of education: 10   Highest education level: Not on file  Occupational History   Occupation: [employer]  Tobacco Use   Smoking status: Former    Current packs/day: 0.15    Types: Cigarettes   Smokeless tobacco: Never  Vaping Use   Vaping status: Some Days  Substance and Sexual Activity   Alcohol use: Yes    Alcohol/week: 4.0 standard drinks of alcohol    Types: 4 Standard drinks or equivalent per week    Comment: No longer drinks straight liquor per pt   Drug use: No   Sexual activity: Yes    Birth control/protection: Implant  Other Topics Concern   Not on file  Social History Narrative   2 children   Two story house   Works at [employer]   Right handed   Social Determinants of Corporate investment banker Strain: Not on file  Food Insecurity: Not on file  Transportation Needs: Not on file  Physical Activity: Not on file  Stress: Not on file  Social Connections: Not on file    No Known Allergies  Outpatient Medications Prior to Visit  Medication Sig Dispense Refill   gabapentin (NEURONTIN) 300 MG  capsule Take 1 capsule (300 mg total) by mouth at bedtime. 90 capsule 1   folic acid (FOLVITE) 1 MG tablet Take 1 tablet (1 mg total) by mouth daily. (Patient not taking: Reported on 04/07/2023) 90 tablet 1   ibuprofen (ADVIL) 800 MG tablet Take 1 tablet (800 mg total) by mouth 3 (three) times daily. (Patient not taking: Reported on 04/07/2023) 21 tablet 0   No facility-administered medications prior to visit.     ROS Review of Systems  Constitutional:  Negative for activity change and appetite change.  HENT:  Negative for sinus pressure and sore throat.   Respiratory:  Negative for chest tightness, shortness of breath and wheezing.   Cardiovascular:  Negative for chest pain and palpitations.  Gastrointestinal:  Negative for abdominal distention, abdominal pain and constipation.  Genitourinary: Negative.   Musculoskeletal: Negative.   Psychiatric/Behavioral:  Negative for behavioral problems and dysphoric mood.     Objective:  BP 134/84 (BP Location: Right Arm, Patient Position: Sitting, Cuff Size: Normal)   Pulse 67   Ht 5\' 2"  (1.575 m)   Wt 117 lb 12.8 oz (53.4 kg)   LMP 04/01/2023   SpO2 99%   BMI 21.55 kg/m      04/07/2023    2:00  PM 04/04/2023   11:55 AM 02/26/2023    2:20 PM  BP/Weight  Systolic BP 134 118 126  Diastolic BP 84 70 83  Wt. (Lbs) 117.8  116.4  BMI 21.55 kg/m2  21.29 kg/m2      Physical Exam Constitutional:      Appearance: She is well-developed.  Cardiovascular:     Rate and Rhythm: Normal rate.     Heart sounds: Normal heart sounds. No murmur heard. Pulmonary:     Effort: Pulmonary effort is normal.     Breath sounds: Normal breath sounds. No wheezing or rales.  Chest:     Chest wall: No tenderness.  Abdominal:     General: Bowel sounds are normal. There is no distension.     Palpations: Abdomen is soft. There is no mass.     Tenderness: There is no abdominal tenderness.  Genitourinary:    Comments: External genitalia, vagina,  cervix-normal No cervical motion tenderness. Musculoskeletal:        General: Normal range of motion.     Right lower leg: No edema.     Left lower leg: No edema.  Neurological:     Mental Status: She is alert and oriented to person, place, and time.  Psychiatric:        Mood and Affect: Mood normal.        Latest Ref Rng & Units 07/21/2022    2:38 PM 05/29/2022    2:25 PM 05/14/2019   10:28 AM  CMP  Glucose 70 - 99 mg/dL 782  81  956   BUN 6 - 20 mg/dL 7  10  7    Creatinine 0.44 - 1.00 mg/dL 2.13  0.86  5.78   Sodium 135 - 145 mmol/L 142  142  142   Potassium 3.5 - 5.1 mmol/L 3.5  3.9  3.8   Chloride 98 - 111 mmol/L 105  104  105   CO2 22 - 32 mmol/L 24  24  20    Calcium 8.9 - 10.3 mg/dL 9.0  9.5  9.9   Total Protein 6.0 - 8.5 g/dL  7.2    Total Bilirubin 0.0 - 1.2 mg/dL  0.8    Alkaline Phos 44 - 121 IU/L  112    AST 0 - 40 IU/L  38    ALT 0 - 32 IU/L  26      Lipid Panel  No results found for: "CHOL", "TRIG", "HDL", "CHOLHDL", "VLDL", "LDLCALC", "LDLDIRECT"  CBC    Component Value Date/Time   WBC 12.6 (H) 07/21/2022 1438   RBC 4.63 07/21/2022 1438   HGB 14.9 07/21/2022 1438   HGB 14.3 05/29/2022 1425   HCT 42.7 07/21/2022 1438   HCT 41.4 05/29/2022 1425   PLT 289 07/21/2022 1438   PLT 245 05/29/2022 1425   MCV 92.2 07/21/2022 1438   MCV 92 05/29/2022 1425   MCH 32.2 07/21/2022 1438   MCHC 34.9 07/21/2022 1438   RDW 13.2 07/21/2022 1438   RDW 11.9 05/29/2022 1425   LYMPHSABS 0.3 (L) 07/21/2022 1438   LYMPHSABS 1.7 05/29/2022 1425   MONOABS 0.7 07/21/2022 1438   EOSABS 0.0 07/21/2022 1438   EOSABS 0.1 05/29/2022 1425   BASOSABS 0.1 07/21/2022 1438   BASOSABS 0.1 05/29/2022 1425    Lab Results  Component Value Date   HGBA1C 4.9 05/29/2022    Assessment & Plan:     1. Encounter for gynecological examination Counseled on 150 minutes of exercise per week, healthy eating (including  decreased daily intake of saturated fats, cholesterol, added sugars,  sodium), STI prevention, routine healthcare maintenance.  - CMP14+EGFR - CBC with Differential/Platelet  2. Screening for cervical cancer - Cytology - PAP             No orders of the defined types were placed in this encounter.   Follow-up: No follow-ups on file.       Hoy Register, MD, FAAFP. [facility] [place], [place] [phone]   04/07/2023, 2:06 PM

## 2023-04-08 LAB — CMP14+EGFR
ALT: 16 IU/L (ref 0–32)
AST: 26 IU/L (ref 0–40)
Albumin: 4.5 g/dL (ref 3.9–4.9)
Alkaline Phosphatase: 124 IU/L — ABNORMAL HIGH (ref 44–121)
BUN/Creatinine Ratio: 10 (ref 9–23)
BUN: 7 mg/dL (ref 6–20)
Bilirubin Total: 0.5 mg/dL (ref 0.0–1.2)
CO2: 25 mmol/L (ref 20–29)
Calcium: 9.5 mg/dL (ref 8.7–10.2)
Chloride: 102 mmol/L (ref 96–106)
Creatinine, Ser: 0.72 mg/dL (ref 0.57–1.00)
Globulin, Total: 2.1 g/dL (ref 1.5–4.5)
Glucose: 89 mg/dL (ref 70–99)
Potassium: 4.4 mmol/L (ref 3.5–5.2)
Sodium: 141 mmol/L (ref 134–144)
Total Protein: 6.6 g/dL (ref 6.0–8.5)
eGFR: 115 mL/min/{1.73_m2} (ref >59)

## 2023-04-08 LAB — CBC WITH DIFFERENTIAL/PLATELET
Basophils Absolute: 0.1 10*3/uL (ref 0.0–0.2)
Basos: 1 %
EOS (ABSOLUTE): 0.1 10*3/uL (ref 0.0–0.4)
Eos: 2 %
Hematocrit: 38.8 % (ref 34.0–46.6)
Hemoglobin: 13.1 g/dL (ref 11.1–15.9)
Immature Grans (Abs): 0 10*3/uL (ref 0.0–0.1)
Immature Granulocytes: 0 %
Lymphocytes Absolute: 1.5 10*3/uL (ref 0.7–3.1)
Lymphs: 21 %
MCH: 33.2 pg — ABNORMAL HIGH (ref 26.6–33.0)
MCHC: 33.8 g/dL (ref 31.5–35.7)
MCV: 99 fL — ABNORMAL HIGH (ref 79–97)
Monocytes Absolute: 0.5 10*3/uL (ref 0.1–0.9)
Monocytes: 7 %
Neutrophils Absolute: 5.1 10*3/uL (ref 1.4–7.0)
Neutrophils: 69 %
Platelets: 275 10*3/uL (ref 150–450)
RBC: 3.94 x10E6/uL (ref 3.77–5.28)
RDW: 13 % (ref 11.7–15.4)
WBC: 7.3 10*3/uL (ref 3.4–10.8)

## 2023-04-09 LAB — CYTOLOGY - PAP
Comment: NEGATIVE
Diagnosis: UNDETERMINED — AB
High risk HPV: NEGATIVE
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
## 2023-05-20 ENCOUNTER — Ambulatory Visit (HOSPITAL_COMMUNITY)
Admission: EM | Admit: 2023-05-20 | Discharge: 2023-05-20 | Disposition: A | Discharge status: Home / Self Care | Payer: BLUE CROSS/BLUE SHIELD | Attending: Family Medicine | Admitting: Family Medicine

## 2023-05-20 ENCOUNTER — Encounter (HOSPITAL_COMMUNITY): Payer: Self-pay

## 2023-05-20 DIAGNOSIS — K0889 Other specified disorders of teeth and supporting structures: Secondary | ICD-10-CM

## 2023-05-20 MED ORDER — AMOXICILLIN-POT CLAVULANATE 875-125 MG PO TABS: 1.0000 | ORAL_TABLET | Freq: Two times a day (BID) | ORAL | 0 refills | Status: DC

## 2023-05-20 NOTE — ED Triage Notes (Signed)
Patient here today with c/o right lower tooth pain that started last night. She took IBU this morning which helped some.

## 2023-05-20 NOTE — Discharge Instructions (Addendum)
Follow up with a dentist Over the counter ibuprofen, 3 tablets every 6 hours with food as needed for pain

## 2023-05-20 NOTE — ED Provider Notes (Signed)
[facility]    CSN: [id number] Arrival date & time: 05/20/23  0808      History   Chief Complaint Chief Complaint  Patient presents with   Dental Pain    HPI Marcia Jackson is a 32 y.o. female.    Dental Pain Associated symptoms: no fever and no headaches   RLQ  dental pain, radiates to throat. Admits broken tooth  Past Medical History:  Diagnosis Date   Depression    History of heavy alcohol consumption     Patient Active Problem List   Diagnosis Date Noted   Primary hypertension 05/29/2022   Alcoholic peripheral neuropathy (HCC) 05/29/2022   Encounter for other contraceptive management 02/03/2017   Rh negative, antepartum 05/17/2016   Rubella non-immune status, antepartum 05/17/2016   Family history of congenital heart defect 05/07/2016    Past Surgical History:  Procedure Laterality Date   WISDOM TOOTH EXTRACTION      OB History     Gravida  2   Para  2   Term  2   Preterm      AB      Living  2      SAB      IAB      Ectopic      Multiple  0   Live Births  2            Home Medications    Prior to Admission medications   Medication Sig Start Date End Date Taking? Authorizing Provider  amoxicillin-clavulanate (AUGMENTIN) 875-125 MG tablet Take 1 tablet by mouth every 12 (twelve) hours. 05/20/23  Yes Meliton Rattan, PA  folic acid (FOLVITE) 1 MG tablet Take 1 tablet (1 mg total) by mouth daily. Patient not taking: Reported on 04/07/2023 11/11/22   Hoy Register, MD  gabapentin (NEURONTIN) 300 MG capsule Take 1 capsule (300 mg total) by mouth at bedtime. 02/26/23   Hoy Register, MD  ibuprofen (ADVIL) 800 MG tablet Take 1 tablet (800 mg total) by mouth 3 (three) times daily. 04/04/23   Rising, Lurena Joiner, PA-C  famotidine (PEPCID) 20 MG tablet Take 1 tablet (20 mg total) by mouth 2 (two) times daily. Patient not taking: Reported on 05/12/2019 04/28/19 05/19/20  Renne Crigler, PA-C  potassium chloride SA (K-DUR) 20 MEQ  tablet Take 1 tablet (20 mEq total) by mouth 2 (two) times daily for 4 days. Patient not taking: Reported on 05/12/2019 04/22/19 05/19/20  Pricilla Loveless, MD  promethazine (PHENERGAN) 25 MG tablet Take 1 tablet (25 mg total) by mouth every 6 (six) hours as needed for nausea or vomiting. Patient not taking: Reported on 05/12/2019 03/22/19 05/19/20  Derwood Kaplan, MD    Family History Family History  Problem Relation Age of Onset   Diabetes Maternal Aunt    Cancer Maternal Grandmother    Diabetes Maternal Grandmother    Healthy Mother    Healthy Father     Social History Social History   Tobacco Use   Smoking status: Former    Current packs/day: 0.15    Types: Cigarettes   Smokeless tobacco: Never  Vaping Use   Vaping status: Former  Substance Use Topics   Alcohol use: Yes    Alcohol/week: 4.0 standard drinks of alcohol    Types: 4 Standard drinks or equivalent per week    Comment: Socially, weekly   Drug use: No     Allergies   Patient has no known allergies.   Review of Systems Review of  Systems  Constitutional:  Negative for appetite change, chills, fatigue and fever.  HENT:  Negative for rhinorrhea, sore throat and trouble swallowing.   Respiratory:  Negative for cough.   Skin:  Negative for rash.  Neurological:  Negative for dizziness and headaches.     Physical Exam Triage Vital Signs ED Triage Vitals  Encounter Vitals Group     BP 05/20/23 0821 128/89     Systolic BP Percentile --      Diastolic BP Percentile --      Pulse Rate 05/20/23 0821 83     Resp 05/20/23 0821 16     Temp 05/20/23 0821 99.3 F (37.4 C)     Temp Source 05/20/23 0821 Oral     SpO2 05/20/23 0821 97 %     Weight 05/20/23 0821 115 lb (52.2 kg)     Height 05/20/23 0821 5\' 3"  (1.6 m)     Head Circumference --      Peak Flow --      Pain Score 05/20/23 0820 5     Pain Loc --      Pain Education --      Exclude from Growth Chart --    No data found.  Updated Vital Signs BP  128/89 (BP Location: Right Arm)   Pulse 83   Temp 99.3 F (37.4 C) (Oral)   Resp 16   Ht 5\' 3"  (1.6 m)   Wt 115 lb (52.2 kg)   LMP 04/26/2023 (Approximate)   SpO2 97%   BMI 20.37 kg/m   Visual Acuity Right Eye Distance:   Left Eye Distance:   Bilateral Distance:    Right Eye Near:   Left Eye Near:    Bilateral Near:     Physical Exam Constitutional:      Appearance: She is not ill-appearing.  HENT:     Head: Normocephalic and atraumatic.     Right Ear: Tympanic membrane and ear canal normal.     Left Ear: Tympanic membrane and ear canal normal.     Nose: No rhinorrhea.     Mouth/Throat:     Mouth: Mucous membranes are moist.     Pharynx: No posterior oropharyngeal erythema.      Comments: Many caries and missing teeth Cardiovascular:     Rate and Rhythm: Normal rate.     Heart sounds: Normal heart sounds.  Pulmonary:     Effort: Pulmonary effort is normal.     Breath sounds: Normal breath sounds.  Musculoskeletal:     Cervical back: Neck supple.  Lymphadenopathy:     Cervical: No cervical adenopathy.  Skin:    General: Skin is warm and dry.  Neurological:     Mental Status: She is alert and oriented to person, place, and time.      UC Treatments / Results  Labs (all labs ordered are listed, but only abnormal results are displayed) Labs Reviewed - No data to display  EKG   Radiology No results found.  Procedures Procedures (including critical care time)  Medications Ordered in UC Medications - No data to display  Initial Impression / Assessment and Plan / UC Course  I have reviewed the triage vital signs and the nursing notes.  Pertinent labs & imaging results that were available during my care of the patient were reviewed by me and considered in my medical decision making (see chart for details).     32 year old female right lower quadrant toothache x 1 day, local caries,  local tenderness, no visible abscess, normal swallowing clear voice no  trismus, treat with antibiotics recommend over-the-counter ibuprofen 3 tablets every 6 hours as needed pain follow-up with dentist patient given list of local dental resources    Discharge Instructions      Follow up with a dentist Over the counter ibuprofen, 3 tablets every 6 hours with food as needed for pain      ED Prescriptions     Medication Sig Dispense Auth. Provider   amoxicillin-clavulanate (AUGMENTIN) 875-125 MG tablet Take 1 tablet by mouth every 12 (twelve) hours. 14 tablet Meliton Rattan, [place]      PDMP not reviewed this encounter.   Meliton Rattan, [place] 05/20/23 [phone]

## 2023-08-19 ENCOUNTER — Ambulatory Visit (HOSPITAL_COMMUNITY)
Admission: EM | Admit: 2023-08-19 | Discharge: 2023-08-19 | Disposition: A | Discharge status: Home / Self Care | Payer: BLUE CROSS/BLUE SHIELD | Attending: Emergency Medicine | Admitting: Emergency Medicine

## 2023-08-19 ENCOUNTER — Encounter (HOSPITAL_COMMUNITY): Payer: Self-pay

## 2023-08-19 DIAGNOSIS — F1011 Alcohol abuse, in remission: Secondary | ICD-10-CM | POA: Diagnosis not present

## 2023-08-19 DIAGNOSIS — E860 Dehydration: Secondary | ICD-10-CM

## 2023-08-19 DIAGNOSIS — N390 Urinary tract infection, site not specified: Secondary | ICD-10-CM | POA: Diagnosis not present

## 2023-08-19 DIAGNOSIS — R1010 Upper abdominal pain, unspecified: Secondary | ICD-10-CM

## 2023-08-19 LAB — POCT URINALYSIS DIP (MANUAL ENTRY)
Glucose, UA: NEGATIVE mg/dL
Nitrite, UA: POSITIVE — AB
Protein Ur, POC: >=300 mg/dL — AB
Spec Grav, UA: 1.025 (ref 1.010–1.025)
Urobilinogen, UA: 4 U/dL — AB
pH, UA: 6 (ref 5.0–8.0)

## 2023-08-19 MED ORDER — CEPHALEXIN 500 MG PO CAPS: 500.0000 mg | ORAL_CAPSULE | Freq: Two times a day (BID) | ORAL | 0 refills | Status: AC

## 2023-08-19 NOTE — ED Triage Notes (Signed)
 Patient here today with c/o generalized abd pain since the night before last. Pain has worsened today. She has noticed some gas and loss of appetite. She was able to eat some today though. She tried taking a muscle relaxer that she had for her back pain which seemed to help her sleep.

## 2023-08-19 NOTE — ED Provider Notes (Signed)
 [facility]    CSN: [id number] Arrival date & time: 08/19/23  1547      History   Chief Complaint Chief Complaint  Patient presents with   Abdominal Pain    HPI Marcia Jackson is a 33 y.o. female.   33 year old female, Marcia Jackson, presents to urgent care for evaluation of generalized upper abdominal pain x 2 days.  Patient states pain has worsened today is 4/10.  Patient reports loss of appetite, patient took muscle relaxer that she had for back pain which seemed to help.  Patient reports regular alcohol use.  Patient denies any fever, nausea, vomiting or diarrhea.  Patient reports some burning with urination that started today as well.  Past medical history: Alcoholic peripheral neuropathy, heavy alcohol use.  The history is provided by the patient. No language interpreter was used.    Past Medical History:  Diagnosis Date   Depression    History of heavy alcohol consumption     Patient Active Problem List   Diagnosis Date Noted   Pain of upper abdomen 08/19/2023   H/O alcohol abuse 08/19/2023   Dehydration 08/19/2023   Acute UTI 08/19/2023   Primary hypertension 05/29/2022   Alcoholic peripheral neuropathy (HCC) 05/29/2022   Encounter for other contraceptive management 02/03/2017   Rh negative, antepartum 05/17/2016   Rubella non-immune status, antepartum 05/17/2016   Family history of congenital heart defect 05/07/2016    Past Surgical History:  Procedure Laterality Date   WISDOM TOOTH EXTRACTION      OB History     Gravida  2   Para  2   Term  2   Preterm      AB      Living  2      SAB      IAB      Ectopic      Multiple  0   Live Births  2            Home Medications    Prior to Admission medications   Medication Sig Start Date End Date Taking? Authorizing Provider  cephALEXin  (KEFLEX ) 500 MG capsule Take 1 capsule (500 mg total) by mouth 2 (two) times daily for 5 days. 08/19/23 08/24/23 Yes Mirela Parsley,  Rilla, NP  gabapentin  (NEURONTIN ) 300 MG capsule Take 1 capsule (300 mg total) by mouth at bedtime. 02/26/23   Newlin, Enobong, MD  famotidine  (PEPCID ) 20 MG tablet Take 1 tablet (20 mg total) by mouth 2 (two) times daily. Patient not taking: Reported on 05/12/2019 04/28/19 05/19/20  Desiderio Chew, PA-C  potassium chloride  SA (K-DUR) 20 MEQ tablet Take 1 tablet (20 mEq total) by mouth 2 (two) times daily for 4 days. Patient not taking: Reported on 05/12/2019 04/22/19 05/19/20  Freddi Hamilton, MD  promethazine  (PHENERGAN ) 25 MG tablet Take 1 tablet (25 mg total) by mouth every 6 (six) hours as needed for nausea or vomiting. Patient not taking: Reported on 05/12/2019 03/22/19 05/19/20  Charlyn Sora, MD    Family History Family History  Problem Relation Age of Onset   Diabetes Maternal Aunt    Cancer Maternal Grandmother    Diabetes Maternal Grandmother    Healthy Mother    Healthy Father     Social History Social History   Tobacco Use   Smoking status: Former    Current packs/day: 0.15    Types: Cigarettes   Smokeless tobacco: Never  Vaping Use   Vaping status: Former  Substance Use Topics  Alcohol use: Yes    Alcohol/week: 4.0 standard drinks of alcohol    Types: 4 Standard drinks or equivalent per week    Comment: Socially, weekly   Drug use: No     Allergies   Patient has no known allergies.   Review of Systems Review of Systems  Constitutional:  Positive for appetite change. Negative for fever.  Gastrointestinal:  Positive for abdominal pain.  All other systems reviewed and are negative.    Physical Exam Triage Vital Signs ED Triage Vitals [08/19/23 1720]  Encounter Vitals Group     BP 130/89     Systolic BP Percentile      Diastolic BP Percentile      Pulse Rate (!) 106     Resp 16     Temp 99.5 F (37.5 C)     Temp Source Oral     SpO2 97 %     Weight 119 lb (54 kg)     Height 5' 2 (1.575 m)     Head Circumference      Peak Flow      Pain Score  4     Pain Loc      Pain Education      Exclude from Growth Chart    No data found.  Updated Vital Signs BP 130/89 (BP Location: Left Arm)   Pulse (!) 106   Temp 99.5 F (37.5 C) (Oral)   Resp 16   Ht 5' 2 (1.575 m)   Wt 119 lb (54 kg)   LMP 08/05/2023 (Approximate)   SpO2 97%   BMI 21.77 kg/m   Visual Acuity Right Eye Distance:   Left Eye Distance:   Bilateral Distance:    Right Eye Near:   Left Eye Near:    Bilateral Near:     Physical Exam Vitals and nursing note reviewed.  Constitutional:      General: She is not in acute distress.    Appearance: She is well-developed and well-groomed.  HENT:     Head: Normocephalic and atraumatic.  Eyes:     Conjunctiva/sclera: Conjunctivae normal.  Cardiovascular:     Rate and Rhythm: Normal rate and regular rhythm.     Heart sounds: No murmur heard. Pulmonary:     Effort: Pulmonary effort is normal. No respiratory distress.     Breath sounds: Normal breath sounds.  Abdominal:     General: Bowel sounds are normal.     Palpations: Abdomen is soft.     Tenderness: There is abdominal tenderness in the right upper quadrant, epigastric area and left upper quadrant.  Musculoskeletal:        General: No swelling.     Cervical back: Neck supple.  Skin:    General: Skin is warm and dry.     Capillary Refill: Capillary refill takes less than 2 seconds.  Neurological:     General: No focal deficit present.     Mental Status: She is alert and oriented to person, place, and time.     GCS: GCS eye subscore is 4. GCS verbal subscore is 5. GCS motor subscore is 6.  Psychiatric:        Attention and Perception: Attention normal.        Mood and Affect: Mood normal.        Speech: Speech normal.        Behavior: Behavior is cooperative.      UC Treatments / Results  Labs (all labs ordered are  listed, but only abnormal results are displayed) Labs Reviewed  POCT URINALYSIS DIP (MANUAL ENTRY) - Abnormal; Notable for the  following components:      Result Value   Color, UA orange (*)    Clarity, UA cloudy (*)    Bilirubin, UA small (*)    Ketones, POC UA >= (160) (*)    Blood, UA moderate (*)    Protein Ur, POC >=300 (*)    Urobilinogen, UA 4.0 (*)    Nitrite, UA Positive (*)    Leukocytes, UA Small (1+) (*)    All other components within normal limits    EKG   Radiology No results found.  Procedures Procedures (including critical care time)  Medications Ordered in UC Medications - No data to display  Initial Impression / Assessment and Plan / UC Course  I have reviewed the triage vital signs and the nursing notes.  Pertinent labs & imaging results that were available during my care of the patient were reviewed by me and considered in my medical decision making (see chart for details).  Clinical Course as of 08/19/23 1832  Tue Aug 19, 2023  1813 Will check UA in office d/t burning with urination,  [JD]  1827 UA has bilirubin, +ketones over 160, proteinuria, nitrite positive, leukocyte small, treating for UTI, discussed exam findings plan of care with patient, patient verbalized understanding this provider. [JD]    Clinical Course User Index [JD] Chassity Ludke, Rilla, NP   Discussed exam findings and plan of care with patient.  Patient verbalized understanding this provider.  Ddx: Abdominal pain, gastritis, peptic ulcer disease, hepatitis /cirrhosis, gallbladder disease, viral illness Final Clinical Impressions(s) / UC Diagnoses   Final diagnoses:  Pain of upper abdomen  H/O alcohol abuse  Dehydration  Acute UTI     Discharge Instructions      Please go to the ER for IV fluids and further evaluation of worsening upper abdominal pain(labs,imaging,etc). You will need assistance with alcohol abuse, counseling. You have a UTI, abx sent.     ED Prescriptions     Medication Sig Dispense Auth. Provider   cephALEXin  (KEFLEX ) 500 MG capsule Take 1 capsule (500 mg total) by mouth 2  (two) times daily for 5 days. 10 capsule Coady Train, NP      PDMP not reviewed this encounter.   Aminta Rilla, NP 08/19/23 [phone]

## 2023-08-19 NOTE — Discharge Instructions (Addendum)
 Please go to the ER for IV fluids and further evaluation of worsening upper abdominal pain(labs,imaging,etc). You will need assistance with alcohol abuse, counseling. You have a UTI, abx sent.

## 2023-08-20 ENCOUNTER — Encounter: Payer: Self-pay | Admitting: Physician Assistant

## 2023-08-20 ENCOUNTER — Ambulatory Visit: Payer: Medicaid Other | Attending: Physician Assistant | Admitting: Physician Assistant

## 2023-08-20 VITALS — BP 116/80 | HR 82 | Wt 118.2 lb

## 2023-08-20 DIAGNOSIS — R1013 Epigastric pain: Secondary | ICD-10-CM | POA: Diagnosis not present

## 2023-08-20 DIAGNOSIS — F1011 Alcohol abuse, in remission: Secondary | ICD-10-CM

## 2023-08-20 NOTE — Progress Notes (Signed)
 Patient ID: Marcia Jackson, female   DOB: 07-09-1991, 33 y.o.   MRN: [id number]     Marcia Jackson, is a 33 y.o. female  [id number]:[id number]  [id number]  DOB - Dec 18, 1990  Chief Complaint  Patient presents with   Hospitalization Follow-up       Subjective:   Marcia Jackson is a 33 y.o. female here today for f/up from having some epigastric abdominal pain for about 3 days.  She went to UC and was found to have UTI and being treated with cephalexin .  The pain has actually started to improve.  She has not drank alcohol in about 4 days.  She was able to quit drinking for about 3 weeks at one point when her husband was in the hospital.  She says she drinks 4-5 drinks about 3 to 4 times weekly.  With the episode of abdominal pain that started 3 days ago, she was having mild N but no vomiting.  No melena or hematochezia.  Pain is mild and intermittent now.    No problems updated.  ALLERGIES: No Known Allergies  PAST MEDICAL HISTORY: Past Medical History:  Diagnosis Date   Depression    History of heavy alcohol consumption     MEDICATIONS AT HOME: Prior to Admission medications   Medication Sig Start Date End Date Taking? Authorizing Provider  cephALEXin  (KEFLEX ) 500 MG capsule Take 1 capsule (500 mg total) by mouth 2 (two) times daily for 5 days. 08/19/23 08/24/23 Yes Defelice, Jeanette, NP  gabapentin  (NEURONTIN ) 300 MG capsule Take 1 capsule (300 mg total) by mouth at bedtime. 02/26/23   Newlin, Enobong, MD  famotidine  (PEPCID ) 20 MG tablet Take 1 tablet (20 mg total) by mouth 2 (two) times daily. Patient not taking: Reported on 05/12/2019 04/28/19 05/19/20  Desiderio Chew, PA-C  potassium chloride  SA (K-DUR) 20 MEQ tablet Take 1 tablet (20 mEq total) by mouth 2 (two) times daily for 4 days. Patient not taking: Reported on 05/12/2019 04/22/19 05/19/20  Freddi Hamilton, MD  promethazine  (PHENERGAN ) 25 MG tablet Take 1 tablet (25 mg total) by mouth every 6 (six) hours as needed for nausea or  vomiting. Patient not taking: Reported on 05/12/2019 03/22/19 05/19/20  Charlyn Sora, MD    ROS: Neg HEENT Neg resp Neg cardiac Neg GU Neg MS Neg psych Neg neuro  Objective:   Vitals:   08/20/23 1523  BP: 116/80  Pulse: 82  SpO2: 99%  Weight: 118 lb 3.2 oz (53.6 kg)   Exam General appearance : Awake, alert, not in any distress. Speech Clear. Not toxic looking HEENT: Atraumatic and Normocephalic, poor dentition Neck: Supple, no JVD. No cervical lymphadenopathy.  Chest: Good air entry bilaterally, CTAB.  No rales/rhonchi/wheezing CVS: S1 S2 regular, no murmurs.  Abdomen: Bowel sounds present, mild tenderness in midepigastric region and RUQ but neg Murphy's and neg Mc Burneys.  No swelling or megaly Extremities: B/L Lower Ext shows no edema, both legs are warm to touch Neurology: Awake alert, and oriented X 3, CN II-XII intact, Non focal Skin: No Rash  Data Review Lab Results  Component Value Date   HGBA1C 4.9 05/29/2022    Assessment & Plan   1. Midepigastric pain (Primary) Improving.  Abdominal pain warnings reviewed.   - H. pylori breath test - Comprehensive metabolic panel - Lipase  2. H/O alcohol abuse [place].org is the website for narcotics anonymous Tonerproviders.com.cy (website) or 262-457-4964 is the information for alcoholics anonymous Both are free and immediately available for help with alcohol  and drug use     Return if symptoms worsen or fail to improve.  The patient was given clear instructions to go to ER or return to medical center if symptoms don't improve, worsen or new problems develop. The patient verbalized understanding. The patient was told to call to get lab results if they haven't heard anything in the next week.      Jon Moores, PA-C [facility] [place], [place] [phone]   08/20/2023, 3:42 PM

## 2023-08-20 NOTE — Patient Instructions (Addendum)
 Abdominal Pain, Adult  Many things can cause belly (abdominal) pain. In most cases, belly pain is not a serious problem and can be watched and treated at home. But in some cases, it can be serious. Your doctor will try to find the cause of your belly pain. Follow these instructions at home: Medicines Take over-the-counter and prescription medicines only as told by your doctor. Do not take medicines that help you poop (laxatives) unless told by your doctor. General instructions Watch your belly pain for any changes. Tell your doctor if the pain gets worse. Drink enough fluid to keep your pee (urine) pale yellow. Contact a doctor if: Your belly pain changes or gets worse. You have very bad cramping or bloating in your belly. You vomit. Your pain gets worse with meals, after eating, or with certain foods. You have trouble pooping or have watery poop for more than 2-3 days. You are not hungry, or you lose weight without trying. You have signs of not getting enough fluid or water (dehydration). These may include: Dark pee, very little pee, or no pee. Cracked lips or dry mouth. Feeling sleepy or weak. You have pain when you pee or poop. Your belly pain wakes you up at night. You have blood in your pee. You have a fever. Get help right away if: You cannot stop vomiting. Your pain is only in one part of your belly, like on the right side. You have bloody or black poop, or poop that looks like tar. You have trouble breathing. You have chest pain. These symptoms may be an emergency. Get help right away. Call 911. Do not wait to see if the symptoms will go away. Do not drive yourself to the hospital. This information is not intended to replace advice given to you by your health care provider. Make sure you discuss any questions you have with your health care provider. Document Revised: 05/15/2022 Document Reviewed: 05/15/2022 Elsevier Patient Education  2024 [employer].     [place].org is the website for narcotics anonymous  Tonerproviders.com.cy (website) or 904-623-4471 is the information for alcoholics anonymous  Both are free and immediately available for help with alcohol and drug use if you find you can't stop on your own

## 2023-08-22 ENCOUNTER — Telehealth: Payer: Self-pay

## 2023-08-22 LAB — COMPREHENSIVE METABOLIC PANEL
ALT: 17 [IU]/L (ref 0–32)
AST: 22 [IU]/L (ref 0–40)
Albumin: 4.7 g/dL (ref 3.9–4.9)
Alkaline Phosphatase: 127 [IU]/L — ABNORMAL HIGH (ref 44–121)
BUN/Creatinine Ratio: 20 (ref 9–23)
BUN: 12 mg/dL (ref 6–20)
Bilirubin Total: 1.2 mg/dL (ref 0.0–1.2)
CO2: 20 mmol/L (ref 20–29)
Calcium: 9.9 mg/dL (ref 8.7–10.2)
Chloride: 100 mmol/L (ref 96–106)
Creatinine, Ser: 0.61 mg/dL (ref 0.57–1.00)
Globulin, Total: 2.6 g/dL (ref 1.5–4.5)
Glucose: 78 mg/dL (ref 70–99)
Potassium: 4.4 mmol/L (ref 3.5–5.2)
Sodium: 140 mmol/L (ref 134–144)
Total Protein: 7.3 g/dL (ref 6.0–8.5)
eGFR: 122 mL/min/{1.73_m2} (ref >59)

## 2023-08-22 LAB — H. PYLORI BREATH TEST: H pylori Breath Test: NEGATIVE

## 2023-08-22 LAB — H. PYLORI BREATH COLLECTION

## 2023-08-22 LAB — LIPASE: Lipase: 24 U/L (ref 14–72)

## 2023-08-22 NOTE — Telephone Encounter (Signed)
 Patient viewed results and Doctor comment through [facility]

## 2023-08-22 NOTE — Telephone Encounter (Signed)
-----   Message from Georgian Co sent at 08/22/2023  7:41 AM EST ----- Please call patient. Stomach ulcer test was negative. Tell her to work on alcohol cessation as we discussed. Thanks, Georgian Co, PA-C

## 2023-08-22 NOTE — Telephone Encounter (Signed)
-----   Message from Georgian Co sent at 08/21/2023  2:12 PM EST ----- Your blood work is reassuring.  Kidney, liver, and electrolyte levels are normal.  Awaiting stomach ulcer results.  Thanks, Georgian Co, PA-C

## 2023-11-19 ENCOUNTER — Other Ambulatory Visit: Payer: Self-pay | Admitting: Family Medicine

## 2023-11-19 DIAGNOSIS — G621 Alcoholic polyneuropathy: Secondary | ICD-10-CM

## 2023-11-19 NOTE — Telephone Encounter (Signed)
 Copied from CRM [id number]. Topic: Clinical - Medication Refill >> Nov 19, 2023  3:14 PM DeAngela L wrote: Most Recent Primary Care Visit:  Provider: Anders Simmonds  Department: [facility]  Visit Type: OFFICE VISIT  Date: 08/20/2023  Medication: gabapentin (NEURONTIN) 300 MG capsule   Has the patient contacted their pharmacy? Yes, pharmacy needed her to call office and get a new request sent  (Agent: If no, request that the patient contact the pharmacy for the refill. If patient does not wish to contact the pharmacy document the reason why and proceed with request.) (Agent: If yes, when and what did the pharmacy advise?)  Is this the correct pharmacy for this prescription? Yes  If no, delete pharmacy and type the correct one.  This is the patient's preferred pharmacy:  [id number] [place], [place] - [address] AT [facility] OF [address] & [facility] [address] [place] [address] Phone: [phone] Fax: [phone]   Has the prescription been filled recently? Yes   Is the patient out of the medication? Yes   Has the patient been seen for an appointment in the last year OR does the patient have an upcoming appointment? Yes   Can we respond through MyChart? No   Agent: Please be advised that Rx refills may take up to 3 business days. We ask that you follow-up with your pharmacy.

## 2023-11-20 MED ORDER — GABAPENTIN 300 MG PO CAPS: 300.0000 mg | ORAL_CAPSULE | Freq: Every day | ORAL | 0 refills | Status: DC

## 2023-11-20 NOTE — Telephone Encounter (Signed)
 Requested Prescriptions  Pending Prescriptions Disp Refills   gabapentin (NEURONTIN) 300 MG capsule 90 capsule 0    Sig: Take 1 capsule (300 mg total) by mouth at bedtime.     Neurology: Anticonvulsants - gabapentin Passed - 11/20/2023 10:58 AM      Passed - Cr in normal range and within 360 days    Creatinine, Ser  Date Value Ref Range Status  08/20/2023 0.61 0.57 - 1.00 mg/dL Final         Passed - Completed PHQ-2 or PHQ-9 in the last 360 days      Passed - Valid encounter within last 12 months    Recent Outpatient Visits           3 months ago Midepigastric pain   [facility] [place]. [facility], Marzella Schlein, [place]   7 months ago Encounter for gynecological examination   [facility] [place] [place]. [facility] Hoy Register, [place]   8 months ago Primary hypertension   [facility] [place]. [facility] Hoy Register, MD   1 year ago Primary hypertension   [facility] [place] - A Dept Of [place]. [facility] Hoy Register, MD   1 year ago Primary hypertension   [facility] - [facility] Dept Of [place]. [facility] Hoy Register, MD

## 2024-01-09 ENCOUNTER — Telehealth (HOSPITAL_BASED_OUTPATIENT_CLINIC_OR_DEPARTMENT_OTHER): Admitting: Nurse Practitioner

## 2024-01-09 ENCOUNTER — Ambulatory Visit: Payer: Self-pay

## 2024-01-09 ENCOUNTER — Encounter: Payer: Self-pay | Admitting: Nurse Practitioner

## 2024-01-09 DIAGNOSIS — F419 Anxiety disorder, unspecified: Secondary | ICD-10-CM

## 2024-01-09 MED ORDER — BUSPIRONE HCL 10 MG PO TABS: 10.0000 mg | ORAL_TABLET | Freq: Two times a day (BID) | ORAL | 3 refills | Status: DC

## 2024-01-09 NOTE — Progress Notes (Signed)
 Virtual Visit Consent   Marcia Jackson, you are scheduled for a virtual visit with a [facility] provider today. Just as with appointments in the office, your consent must be obtained to participate. Your consent will be active for this visit and any virtual visit you may have with one of our providers in the next 365 days. If you have a MyChart account, a copy of this consent can be sent to you electronically.  As this is a virtual visit, video technology does not allow for your provider to perform a traditional examination. This may limit your provider's ability to fully assess your condition. If your provider identifies any concerns that need to be evaluated in person or the need to arrange testing (such as labs, EKG, etc.), we will make arrangements to do so. Although advances in technology are sophisticated, we cannot ensure that it will always work on either your end or our end. If the connection with a video visit is poor, the visit may have to be switched to a telephone visit. With either a video or telephone visit, we are not always able to ensure that we have a secure connection.  By engaging in this virtual visit, you consent to the provision of healthcare and authorize for your insurance to be billed (if applicable) for the services provided during this visit. Depending on your insurance coverage, you may receive a charge related to this service.  I need to obtain your verbal consent now. Are you willing to proceed with your visit today? LINSI HUMANN has provided verbal consent on 01/09/2024 for a virtual visit (video or telephone). Collins Dean, NP  Date: 01/09/2024 11:34 AM   Virtual Visit via Video Note   I, Collins Dean, connected with  Marcia Jackson  ([id number], Feb 23, 1991) on 01/09/24 at 10:50 AM EDT by a video-enabled telemedicine application and verified that I am speaking with the correct person using two identifiers.  Location: Patient: Virtual Visit Location  Patient: Home Provider: Virtual Visit Location Provider: Home Office   I discussed the limitations of evaluation and management by telemedicine and the availability of in person appointments. The patient expressed understanding and agreed to proceed.    History of Present Illness: Marcia Jackson is a 33 y.o. who identifies as a female who was assigned female at birth, and is being seen today for anxiety.    Ms Peplinski was prescribed hydroxyzine  25 mg 3 times daily as needed for anxiety by her PCP May 29, 2022.  She states the medication caused her to feel out of it and not herself.  She does continue to have symptoms of anxiety and would like to try alternate treatment for this today.    01/09/2024   11:30 AM 08/20/2023    3:24 PM 04/07/2023    2:03 PM 02/26/2023    2:23 PM  GAD 7 : Generalized Anxiety Score  Nervous, Anxious, on Edge 3 1 0 0  Control/stop worrying 3 1 0 0  Worry too much - different things 2 0 0 0  Trouble relaxing 2 0 0 0  Restless 2 0 0 0  Easily annoyed or irritable 2 0 0 0  Afraid - awful might happen 1 1 0 0  Total GAD 7 Score 15 3 0 0  Anxiety Difficulty Somewhat difficult  Not difficult at all         01/09/2024   11:29 AM 08/20/2023    3:24 PM 04/07/2023  2:02 PM  Depression screen PHQ 2/9  Decreased Interest 1 0 0  Down, Depressed, Hopeless 1 0 0  PHQ - 2 Score 2 0 0  Altered sleeping 1  0  Tired, decreased energy 1  0  Change in appetite 1  0  Feeling bad or failure about yourself  0  0  Trouble concentrating 0  0  Moving slowly or fidgety/restless 0  0  Suicidal thoughts 0  0  PHQ-9 Score 5  0  Difficult doing work/chores Not difficult at all  Not difficult at all    Problems:  Patient Active Problem List   Diagnosis Date Noted   Pain of upper abdomen 08/19/2023   H/O alcohol abuse 08/19/2023   Dehydration 08/19/2023   Acute UTI 08/19/2023   Primary hypertension 05/29/2022   Alcoholic peripheral neuropathy (HCC) 05/29/2022    Encounter for other contraceptive management 02/03/2017   Rh negative, antepartum 05/17/2016   Rubella non-immune status, antepartum 05/17/2016   Family history of congenital heart defect 05/07/2016    Allergies: No Known Allergies Medications:  Current Outpatient Medications:    busPIRone (BUSPAR) 10 MG tablet, Take 1 tablet (10 mg total) by mouth 2 (two) times daily., Disp: 60 tablet, Rfl: 3   gabapentin  (NEURONTIN ) 300 MG capsule, Take 1 capsule (300 mg total) by mouth at bedtime., Disp: 90 capsule, Rfl: 0  Observations/Objective: Patient is well-developed, well-nourished in no acute distress.  Resting comfortably in her car which is parked Head is normocephalic, atraumatic.  No labored breathing.  Speech is clear and coherent with logical content.  Patient is alert and oriented at baseline.   Assessment and Plan: 1. Anxiety (Primary) - busPIRone (BUSPAR) 10 MG tablet; Take 1 tablet (10 mg total) by mouth 2 (two) times daily.  Dispense: 60 tablet; Refill: 3 - Ambulatory referral to Behavioral Health   Follow Up Instructions: I discussed the assessment and treatment plan with the patient. The patient was provided an opportunity to ask questions and all were answered. The patient agreed with the plan and demonstrated an understanding of the instructions.  A copy of instructions were sent to the patient via MyChart unless otherwise noted below.    The patient was advised to call back or seek an in-person evaluation if the symptoms worsen or if the condition fails to improve as anticipated.    Davyon Fisch W Lynnann Knudsen, NP

## 2024-01-09 NOTE — Telephone Encounter (Signed)
 Noted

## 2024-01-09 NOTE — Patient Instructions (Addendum)
 Behavioral Health Resources:   What if I or someone I know is in crisis?  If you are thinking about harming yourself or having thoughts of suicide, or if you know someone who is, seek help right away.  Call your doctor or mental health care provider.  Call 911 or go to a hospital emergency room to get immediate help, or ask a friend or family member to help you do these things.  Call the [place]  National Suicide Prevention Lifeline's toll-free, 24-hour hotline at 1-800-273-TALK (818)766-4255) or TTY: 1-800-799-4 TTY 347-862-3210) to talk to a trained counselor.  If you are in crisis, make sure you are not left alone.   If someone else is in crisis, make sure he or she is not left alone   24 Hour Availability  [facility]  [address] [place], [place] [address] 1 [phone] or [phone] or [phone] Walk-in urgent care Mondays and Wednesdays 8 AM to 11 AM 1st and 2nd Fridays 1 PM to 5 PM New patient psychiatry and medication management only walk-ins Monday Wednesdays Thursdays Fridays 8 AM to 11 AM  [facility] (therapy and psychiatry Signature placed at [facility] [address], [address], [place], [place] [address] [phone]   [facility] (therapy only) no psychiatry (Domestic Violence, Rape & Victim Assistance [phone] [place] [address] Monday through Friday 830 to 12 PM/1 PM to 2:30 PM

## 2024-01-09 NOTE — Telephone Encounter (Signed)
  Chief Complaint: requesting new medication for anxiety Symptoms: moderate anxiety Frequency: x years Pertinent Negatives: Patient denies N/A. Disposition: [] ED /[] Urgent Care (no appt availability in office) / [x] Appointment(In office/virtual)/ []  [facility]/ [] Home Care/ [] Refused Recommended Disposition /[] [facility]/ []  Follow-up with PCP Additional Notes: Patient  states she has been prescribed hydroxyzine  by PCP Newlin in the past and she has been taking it and doesn'Jackson feel it is effective. She is scheduled for virtual appointment with NP Jamal Mays today as next available appointment is not until July. Patient also provided with the [facility] Urgent Care information.  Copied from CRM [id number]. Topic: Clinical - Medication Question >> Jan 09, 2024  9:23 AM Marcia Jackson wrote: Reason for CRM: patient called to see if she could get on a different anxiety medication as the hydrOXYzine  (ATARAX ) 25 MG tablet makes her feel weird. Please f/u with patient Reason for Disposition  Prescription request for new medicine (not a refill)  Answer Assessment - Initial Assessment Questions 1. NAME of MEDICINE: "What medicine(s) are you calling about?"     Hydroxyzine .  2. QUESTION: "What is your question?" (e.g., double dose of medicine, side effect)     Patient would like to know if she can switch to a different anxiety medication. She states the hydroxyzine  makes her feel like she is "off" and she has to lie down and close her eyes for a while.  3. PRESCRIBER: "Who prescribed the medicine?" Reason: if prescribed by specialist, call should be referred to that group.     Dr Newlin (she states it was prescribed about a year ago)  4. SYMPTOMS: "Do you have any symptoms?" If Yes, ask: "What symptoms are you having?"  "How bad are the symptoms (e.g., mild, moderate, severe)     Moderate anxiety. Denies any panic attacks.  5. PREGNANCY:  "Is there any chance  that you are pregnant?" "When was your last menstrual period?"     LMP: yesterday.  Protocols used: Medication Question Call-A-AH

## 2024-03-11 ENCOUNTER — Other Ambulatory Visit: Payer: Self-pay | Admitting: Family Medicine

## 2024-03-11 DIAGNOSIS — G621 Alcoholic polyneuropathy: Secondary | ICD-10-CM

## 2024-03-11 NOTE — Telephone Encounter (Unsigned)
 Copied from CRM [id number]. Topic: Clinical - Medication Refill >> Mar 11, 2024  1:11 PM Turkey B wrote: Medication: gabapentin  (NEURONTIN ) 300 MG capsule  Has the patient contacted their pharmacy? Yes  (Agent: If yes, when and what did the pharmacy advise?)contact pcp  This is the patient's preferred pharmacy:  [facility] #[id number] [place], [place] - [address] AT [facility] OF [address] & [facility] [address] [place] [address] Phone: [phone] Fax: [phone]  Is this the correct pharmacy for this prescription? yes .   Has the prescription been filled recently? no  Is the patient out of the medication? yes  Has the patient been seen for an appointment in the last year OR does the patient have an upcoming appointment? yes  Can we respond through MyChart? yes  Agent: Please be advised that Rx refills may take up to 3 business days. We ask that you follow-up with your pharmacy.

## 2024-03-12 MED ORDER — GABAPENTIN 300 MG PO CAPS: 300.0000 mg | ORAL_CAPSULE | Freq: Every day | ORAL | 0 refills | Status: DC

## 2024-03-12 NOTE — Telephone Encounter (Signed)
 Requested Prescriptions  Pending Prescriptions Disp Refills   gabapentin  (NEURONTIN ) 300 MG capsule 90 capsule 0    Sig: Take 1 capsule (300 mg total) by mouth at bedtime.     Neurology: Anticonvulsants - gabapentin  Passed - 03/12/2024 12:19 PM      Passed - Cr in normal range and within 360 days    Creatinine, Ser  Date Value Ref Range Status  08/20/2023 0.61 0.57 - 1.00 mg/dL Final         Passed - Completed PHQ-2 or PHQ-9 in the last 360 days      Passed - Valid encounter within last 12 months    Recent Outpatient Visits           2 months ago Anxiety   [facility] Dept Of [place]. [facility] Theotis Haze ORN, NP   6 months ago Midepigastric pain   [facility] [facility] Dept Of [place]. [facility], [place] M, [place]   11 months ago Encounter for gynecological examination   [facility] [place] - [facility] Dept Of [place]. [facility] Delbert Clam, MD   1 year ago Primary hypertension   [facility] [facility] [place] - A Dept Of [place]. [facility] Delbert Clam, MD   1 year ago Primary hypertension   [facility] [place] - A Dept Of [place]. [facility] Delbert Clam, MD

## 2024-03-17 ENCOUNTER — Ambulatory Visit (INDEPENDENT_AMBULATORY_CARE_PROVIDER_SITE_OTHER): Admitting: Mental Health

## 2024-03-17 DIAGNOSIS — F4310 Post-traumatic stress disorder, unspecified: Secondary | ICD-10-CM | POA: Insufficient documentation

## 2024-03-17 DIAGNOSIS — F1020 Alcohol dependence, uncomplicated: Secondary | ICD-10-CM | POA: Diagnosis not present

## 2024-03-17 DIAGNOSIS — F411 Generalized anxiety disorder: Secondary | ICD-10-CM

## 2024-03-17 NOTE — Progress Notes (Unsigned)
 SABRA
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
## 2024-04-20 ENCOUNTER — Ambulatory Visit (INDEPENDENT_AMBULATORY_CARE_PROVIDER_SITE_OTHER)

## 2024-04-20 ENCOUNTER — Encounter (HOSPITAL_COMMUNITY): Payer: Self-pay

## 2024-04-20 DIAGNOSIS — F1020 Alcohol dependence, uncomplicated: Secondary | ICD-10-CM

## 2024-04-20 DIAGNOSIS — F4310 Post-traumatic stress disorder, unspecified: Secondary | ICD-10-CM

## 2024-04-20 DIAGNOSIS — F411 Generalized anxiety disorder: Secondary | ICD-10-CM

## 2024-04-20 NOTE — Progress Notes (Addendum)
 THERAPIST PROGRESS NOTE  Session Time: 2:00 pm to 2:53  Type of Therapy: Individual   Therapist Response/Interventions: CBT, discuss the interaction of alcohol and drugs on mental health disorders, psycho-education about addition as a brain disease.  Treatment Goals addressed:   Template: Substance Use Disorder         Problem: Substance Use     Dates: Start:  04/20/24       Disciplines: Interdisciplinary, PROVIDER        Goal: [place] will abstain from alcohol 30/30 days per month based on self reports or breathalyzer if indicated.     Dates: Start:  04/20/24    Expected End:  10/18/24       Disciplines: Interdisciplinary, PROVIDER             Goal: Arsema will decrease her anxiety by reporting GAD-7 scores of no higher than 7.     Dates: Start:  04/20/24    Expected End:  10/13/24       Disciplines: Interdisciplinary, PROVIDER             Intervention: Therapist will assist Noriko in identifying and avoiding triggers for alcohol use and assist her in understanding addiction as a disease.     Dates: Start:  04/20/24                Intervention: Therapist will assist Talina in identifying thoughts and behaviors that can contribute to feelings of anxiety     Dates: Start:  04/20/24                 Summary: Yamaris presents today after having had a CCA from Geni Medley, [facility] on 03-17-24.  She carries the following diagnoses:  Alcohol use Disorder, PTSD and GAD. Substance use information was gathered on that day.  She says she has been drinking daily for the last month about 1/2 to 3/4 of a fifth. She says she has never had serious withdrawal symptoms. She says she has stopped her use on occasion and on the 3rd day without use of alcohol she becomes irritable and gets a little shaky.  She says her blood pressure has always been normal.  Chancy says her longest sobriety has been 2-3 weeks when she was in the hospital in 2023.    Family History of addiction: Rohini says  her maternal uncle suffers from alcohol addictions,  She says her mother drank when she was younger for a couple of years, but stopped using alcohol and started crack cocaine and stopped that 2 years ago.   Substance Use:  Most Recent: Cari says she drinks on a daily basis. She says her use has increased from what she reported on 03-17-24 to Geni Medley, [facility]. Last use was last night when she drank 1/2 of a fifth of liquor. She says she has drank at this rate/amount for one month. She says when she has tried to cut down in the past that she notices on the 3rd of not drinking she will get irritated and shakes a little bit. She says she has never felt like she needed to go to detox.  She reports her longest sobriety as 2-3 weeks when her husband was in the hospital in 2023.  She says her husband had a stroke at the age of 53. She reports he drinks and probably needs help. Therapist encourages her to have him to seek treatment and tells her of the walk in hours here.  Therapist asks Sharay what her  take away from today was and she says she realizes she cannot have the mindset that she can have just one drink and she learned she needs to start checking her blood pressure if she attempts to self detox and knows it would be best to come in downstairs to be medically detoxed.      Progress Towards Goals: initial  Suicidal/Homicidal: denies  Plan: Return again on 05-13-24 at 3pm.  Diagnosis: Alcohol Use Disorder, Moderate, Dependence, PTSD, GAD  Collaboration of Care: n/a  Patient/Guardian was advised Release of Information must be obtained prior to any record release in order to collaborate their care with an outside provider. Patient/Guardian was advised if they have not already done so to contact the registration department to sign all necessary forms in order for us  to release information regarding their care.   Consent: Patient/Guardian gives verbal consent for treatment and assignment of benefits  for services provided during this visit. Patient/Guardian expressed understanding and agreed to proceed.   Darice Soua Lenk,MS.  LMFT, LCAS

## 2024-05-11 ENCOUNTER — Other Ambulatory Visit: Payer: Self-pay

## 2024-05-11 ENCOUNTER — Encounter (HOSPITAL_BASED_OUTPATIENT_CLINIC_OR_DEPARTMENT_OTHER): Payer: Self-pay | Admitting: Emergency Medicine

## 2024-05-11 ENCOUNTER — Emergency Department (HOSPITAL_BASED_OUTPATIENT_CLINIC_OR_DEPARTMENT_OTHER)
Admission: EM | Admit: 2024-05-11 | Discharge: 2024-05-11 | Disposition: A | Discharge status: Home / Self Care | Attending: Emergency Medicine | Admitting: Emergency Medicine

## 2024-05-11 DIAGNOSIS — B349 Viral infection, unspecified: Secondary | ICD-10-CM | POA: Diagnosis not present

## 2024-05-11 DIAGNOSIS — R059 Cough, unspecified: Secondary | ICD-10-CM | POA: Diagnosis present

## 2024-05-11 DIAGNOSIS — R051 Acute cough: Secondary | ICD-10-CM

## 2024-05-11 LAB — RESP PANEL BY RT-PCR (RSV, FLU A&B, COVID)  RVPGX2
Influenza A by PCR: NEGATIVE
Influenza B by PCR: NEGATIVE
Resp Syncytial Virus by PCR: NEGATIVE
SARS Coronavirus 2 by RT PCR: NEGATIVE

## 2024-05-11 MED ORDER — PREDNISONE 20 MG PO TABS: 40.0000 mg | ORAL_TABLET | Freq: Every day | ORAL | 0 refills | Status: AC

## 2024-05-11 NOTE — Discharge Instructions (Addendum)
 You were placed in a short course of steroids to help with your symptoms, please take 2 tablets daily for the next 5 days.  In addition, you may purchase some Robitussin, over-the-counter to help with your cough.

## 2024-05-11 NOTE — ED Triage Notes (Signed)
 Pt caox4 ambulatory NAD c/o productive cough, congestion, sinus pressure, sore throat worsening over the past week. Denies fever.

## 2024-05-11 NOTE — ED Notes (Signed)
 Swab obtained in Triage.

## 2024-05-11 NOTE — ED Notes (Signed)
 DC paperwork given and verbally understood.

## 2024-05-11 NOTE — ED Provider Notes (Signed)
 [facility] EMERGENCY DEPARTMENT AT [facility] Provider Note   CSN: [id number] Arrival date & time: 05/11/24  9097     Patient presents with: Cough   Marcia Jackson is a 33 y.o. female.   33 year old female with a medical history of alcohol consumption presents to the ED with a chief complaint of cough, congestion, runny nose for the past week.  Patient reports she is tried over-the-counter medication such as NyQuil, DayQuil, ibuprofen  without any improvement in symptoms.  She reports she has not had any fevers.  She mainly feels pain along her throat, along with any swallowing.  Denies any fever, chest pain, shortness of breath.  The history is provided by the patient.  Cough Cough characteristics:  Dry Sputum characteristics:  Nondescript Severity:  Moderate Onset quality:  Gradual Duration:  6 days Timing:  Constant Progression:  Unchanged Chronicity:  New Smoker: no   Associated symptoms: no chills and no fever        Prior to Admission medications   Medication Sig Start Date End Date Taking? Authorizing Provider  predniSONE  (DELTASONE ) 20 MG tablet Take 2 tablets (40 mg total) by mouth daily for 5 days. 05/11/24 05/16/24 Yes Hayle Parisi, PA-C  busPIRone  (BUSPAR ) 10 MG tablet Take 1 tablet (10 mg total) by mouth 2 (two) times daily. 01/09/24   Fleming, Zelda W, NP  gabapentin  (NEURONTIN ) 300 MG capsule Take 1 capsule (300 mg total) by mouth at bedtime. 03/12/24   Newlin, Enobong, MD  famotidine  (PEPCID ) 20 MG tablet Take 1 tablet (20 mg total) by mouth 2 (two) times daily. Patient not taking: Reported on 05/12/2019 04/28/19 05/19/20  Desiderio Chew, PA-C  potassium chloride  SA (K-DUR) 20 MEQ tablet Take 1 tablet (20 mEq total) by mouth 2 (two) times daily for 4 days. Patient not taking: Reported on 05/12/2019 04/22/19 05/19/20  Freddi Hamilton, MD  promethazine  (PHENERGAN ) 25 MG tablet Take 1 tablet (25 mg total) by mouth every 6 (six) hours as needed for nausea or  vomiting. Patient not taking: Reported on 05/12/2019 03/22/19 05/19/20  Charlyn Sora, MD    Allergies: Patient has no known allergies.    Review of Systems  Constitutional:  Negative for chills and fever.  Respiratory:  Positive for cough.     Updated Vital Signs BP 127/89   Pulse 93   Temp 98.3 F (36.8 C) (Oral)   Resp 20   Ht 5' 4 (1.626 m)   Wt 53.5 kg   SpO2 100%   BMI 20.25 kg/m   Physical Exam Vitals and nursing note reviewed.  Constitutional:      Appearance: Normal appearance.  HENT:     Head: Normocephalic and atraumatic.     Ears:     Comments: Bilateral ears without any injection, impaction.  Mild cerumen to the right ear.    Nose: Congestion present.     Mouth/Throat:     Mouth: Mucous membranes are moist.     Pharynx: Posterior oropharyngeal erythema present.     Comments: Oropharynx with some mild erythema noted. Cardiovascular:     Rate and Rhythm: Normal rate.  Pulmonary:     Effort: Pulmonary effort is normal.     Comments: Lungs are clear to auscultation without any wheezing, rhonchi or rales. Abdominal:     General: Abdomen is flat.  Musculoskeletal:     Cervical back: Normal range of motion and neck supple.  Skin:    General: Skin is warm and dry.  Neurological:  Mental Status: She is alert and oriented to person, place, and time.     (all labs ordered are listed, but only abnormal results are displayed) Labs Reviewed  RESP PANEL BY RT-PCR (RSV, FLU A&B, COVID)  [id number]    EKG: None  Radiology: No results found.   Procedures   Medications Ordered in the ED - No data to display                                  Medical Decision Making Risk Prescription drug management.   Present to the ED with a chief complaint of cough, this has been ongoing for the past 6 days, despite any over-the-counter medication she has not had any improvement in symptoms.  No fever, no underlying asthma, not immunocompromise.  Exam is benign.   Bilateral ears without any injection or effusion.  Oropharynx does appear erythematous, but no tonsillar exudate or PTA noted.  Lungs are clear to auscultation with no wheezing, rhonchi or rales.  Abdomen is soft.  Bilateral lower extremities with no pitting edema.  I am not concerned for pneumonia at this time with reassuring exam.  Does seem to be viral in nature.  Will will bypass testing of COVID-19, versus influenza as this would not change my management.  Will go home on a short course of steroids, hemodynamically stable for discharge.   Portions of this note were generated with Scientist, clinical (histocompatibility and immunogenetics). Dictation errors may occur despite best attempts at proofreading.   Final diagnoses:  Acute cough  Viral illness    ED Discharge Orders          Ordered    predniSONE  (DELTASONE ) 20 MG tablet  Daily        05/11/24 0931               Kimberli Winne, PA-C 05/11/24 9065    Freddi Hamilton, MD 05/12/24 [id number]

## 2024-05-13 ENCOUNTER — Ambulatory Visit (INDEPENDENT_AMBULATORY_CARE_PROVIDER_SITE_OTHER)

## 2024-05-13 DIAGNOSIS — F4310 Post-traumatic stress disorder, unspecified: Secondary | ICD-10-CM

## 2024-05-13 DIAGNOSIS — F1020 Alcohol dependence, uncomplicated: Secondary | ICD-10-CM | POA: Diagnosis not present

## 2024-05-13 DIAGNOSIS — F411 Generalized anxiety disorder: Secondary | ICD-10-CM

## 2024-05-13 NOTE — Progress Notes (Signed)
 THERAPIST PROGRESS NOTE  Session Time: 2:45 pm to 3:55 pm  Type of Therapy: Individual   Therapist Response/Interventions: CBT, , psycho-education regarding addition as a brain disease, begin discussing internal triggers, discuss withdrawal symptoms and the reason one should seek detox rather than trying to detox themselves, motivational interviewing  Treatment Goals addressed:   Template: Substance Use Disorder         Problem: Substance Use     Dates: Start:  04/20/24       Disciplines: Interdisciplinary, PROVIDER        Goal: Marcia Jackson will abstain from alcohol 30/30 days per month based on self reports or breathalyzer if indicated.     Dates: Start:  04/20/24    Expected End:  10/18/24       Disciplines: Interdisciplinary, PROVIDER             Goal: Marcia Jackson will decrease her anxiety by reporting GAD-7 scores of no higher than 7.     Dates: Start:  04/20/24    Expected End:  10/13/24       Disciplines: Interdisciplinary, PROVIDER             Intervention: Therapist will assist Marcia Jackson in identifying and avoiding triggers for alcohol use and assist her in understanding addiction as a disease.     Dates: Start:  04/20/24                Intervention: Therapist will assist Marcia Jackson in identifying thoughts and behaviors that can contribute to feelings of anxiety     Dates: Start:  04/20/24                 Summary: Marcia Jackson presents today in person. She says she has been doing good with the exception of having a  respiratory illness. Therapist asks about her alcohol use.  She says she has been sober for two days this week. She says she has cut down to two about two drinks per day.  She says last week, she went three days and did not drink. She says she did not want alcohol on those days.  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  Marcia Jackson reports she has never experience any withdrawal seizures. She says the main withdrawal symptom she has experienced in the past was night  sweats.  Marcia Jackson reports she had one month sobriety in February 2025.  She reports she struggles with cravings.  Marcia Jackson says she is hoping to get on some medicine that will help the cravings. She says her GP wanted to prescribe her Antabuse but she was not interested in something that makes her physically sick if she should drin any alcohol. Marcia Jackson says she wants to stop drinking entirely but is afraid of what could happen given her history of alcohol use. Therapist explains [facility] is open 24/7 and she can present for detox any time she chooses. Marcia Jackson says she is trying to get her husband to come in as he has voiced he wants to stop drinking.   Marcia Jackson identifies boredom as one of her internal triggers. We discuss coping strategies on how to deal with internal triggers.  Marcia Jackson says what helped her most today is learning that addiction is a brain disease and learning the role of dopamine receptors and how they can regenerate during recovery and their role  in mood regulation.   Progress Towards Goals: progressing  Suicidal/Homicidal: denies  Plan: Return again on June 01, 2024 at 3pm  Diagnosis: Alcohol Use  Disorder, Moderate, Dependence, PTSD, GAD  Collaboration of Care: n/a  Patient/Guardian was advised Release of Information must be obtained prior to any record release in order to collaborate their care with an outside provider. Patient/Guardian was advised if they have not already done so to contact the registration department to sign all necessary forms in order for us  to release information regarding their care.   Consent: Patient/Guardian gives verbal consent for treatment and assignment of benefits for services provided during this visit. Patient/Guardian expressed understanding and agreed to proceed.   Darice Earlyn Sylvan,MS.  LMFT, LCAS

## 2024-06-01 ENCOUNTER — Ambulatory Visit (HOSPITAL_COMMUNITY)

## 2024-06-07 ENCOUNTER — Other Ambulatory Visit: Payer: Self-pay | Admitting: Family Medicine

## 2024-06-07 DIAGNOSIS — G621 Alcoholic polyneuropathy: Secondary | ICD-10-CM

## 2024-06-08 NOTE — Telephone Encounter (Signed)
 Requested medications are due for refill today.  yes  Requested medications are on the active medications list.  yes  Last refill. 03/12/2024 #90 0 rf  Future visit scheduled.   no  Notes to clinic.  Please review for refill. Pt has not been seen for chronic issues in over 1 year.    Requested Prescriptions  Pending Prescriptions Disp Refills   gabapentin  (NEURONTIN ) 300 MG capsule [Pharmacy Med Name: GABAPENTIN  300MG  CAPSULES] 90 capsule 0    Sig: TAKE 1 CAPSULE(300 MG) BY MOUTH AT BEDTIME     Neurology: Anticonvulsants - gabapentin  Passed - 06/08/2024  3:56 PM      Passed - Cr in normal range and within 360 days    Creatinine, Ser  Date Value Ref Range Status  08/20/2023 0.61 0.57 - 1.00 mg/dL Final         Passed - Completed PHQ-2 or PHQ-9 in the last 360 days      Passed - Valid encounter within last 12 months    Recent Outpatient Visits           5 months ago Anxiety   [facility] Dept Of [place]. [facility] Theotis Haze ORN, NP   9 months ago Midepigastric pain   [facility] Dept Of [place]. [facility] [place], Jon HERO, [place]   1 year ago Encounter for gynecological examination   [facility] [place] - [facility] Dept Of [place]. [facility] Delbert Clam, MD   1 year ago Primary hypertension   [facility] [place] - [facility] Dept Of [place]. [facility] Delbert Clam, MD   1 year ago Primary hypertension    [facility] - [facility] Dept Of [place]. [facility] Delbert Clam, MD

## 2024-06-10 ENCOUNTER — Encounter (HOSPITAL_COMMUNITY): Payer: Self-pay

## 2024-06-10 ENCOUNTER — Telehealth (HOSPITAL_COMMUNITY): Payer: Self-pay

## 2024-06-10 NOTE — Telephone Encounter (Signed)
 Tran calls and leaves a message saying she missed her appointment with this therapist on 06-01-24 and wants to reschedule. Therapist calls Danah and her voice mail is full.  Therapist will send a lett via my chart.  Darice Simpler, MS, LMFT, LCAS
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
## 2024-06-18 ENCOUNTER — Ambulatory Visit (INDEPENDENT_AMBULATORY_CARE_PROVIDER_SITE_OTHER): Admitting: Psychiatry

## 2024-06-18 VITALS — BP 114/81 | HR 88 | Wt 127.6 lb

## 2024-06-18 DIAGNOSIS — F4310 Post-traumatic stress disorder, unspecified: Secondary | ICD-10-CM | POA: Diagnosis not present

## 2024-06-18 DIAGNOSIS — F411 Generalized anxiety disorder: Secondary | ICD-10-CM | POA: Diagnosis not present

## 2024-06-18 DIAGNOSIS — F1020 Alcohol dependence, uncomplicated: Secondary | ICD-10-CM | POA: Diagnosis not present

## 2024-06-18 MED ORDER — NALTREXONE HCL 50 MG PO TABS: 25.0000 mg | ORAL_TABLET | Freq: Every day | ORAL | 0 refills | Status: AC

## 2024-06-18 MED ORDER — NALTREXONE HCL 50 MG PO TABS: 50.0000 mg | ORAL_TABLET | Freq: Every day | ORAL | 1 refills | Status: DC

## 2024-06-24 ENCOUNTER — Other Ambulatory Visit: Payer: Self-pay

## 2024-06-24 ENCOUNTER — Emergency Department (HOSPITAL_BASED_OUTPATIENT_CLINIC_OR_DEPARTMENT_OTHER): Admission: EM | Admit: 2024-06-24 | Discharge: 2024-06-24 | Disposition: A | Discharge status: Home / Self Care

## 2024-06-24 DIAGNOSIS — M545 Low back pain, unspecified: Secondary | ICD-10-CM | POA: Diagnosis present

## 2024-06-24 DIAGNOSIS — M5441 Lumbago with sciatica, right side: Secondary | ICD-10-CM

## 2024-06-24 MED ORDER — LIDOCAINE 5 % EX PTCH: 1.0000 | MEDICATED_PATCH | CUTANEOUS | 0 refills | Status: AC

## 2024-06-24 MED ORDER — METHOCARBAMOL 500 MG PO TABS: 500.0000 mg | ORAL_TABLET | Freq: Two times a day (BID) | ORAL | 0 refills | Status: AC | PRN

## 2024-06-24 NOTE — Discharge Instructions (Signed)
 As discussed take Tylenol  alternating with ibuprofen .  Dosages as directed on the packaging.  You may alternate to every 4 hours.  You may also try the lidocaine  patch and muscle relaxer that we have prescribed you for further pain relief.  Please follow-up with your primary doctor soon as possible.  Return for fevers, chills, weakness in your lower extremities, numbness in your genital area, bowel or bladder incontinence or unable to urinate or have bowel movement, severe pain.  You may also return if you develop any new or worsening symptoms that are concerning to you.

## 2024-06-24 NOTE — ED Triage Notes (Signed)
 Patient reports lower back pain that began today. Denies injury. States does heavy lifting at work.

## 2024-06-24 NOTE — ED Provider Notes (Signed)
 [place] EMERGENCY DEPARTMENT AT [facility] Provider Note   CSN: [id number] Arrival date & time: 06/24/24  1458     Patient presents with: Back Pain   Marcia Jackson is a 33 y.o. female.   33 year old female presenting emergency department for low back pain.  Right-sided.  Some radiation down the back of her thigh stops at the knee.  Has had back pain issues for years.  Acutely worsened today.  No inciting factors that she can recall.  No trauma, no unexplained weight loss, no weakness in lower extremities, bowel or bladder incontinence, saddle anesthesia, history of IV drug use, history of cancer, not immunosuppressed.  Has not tried any medications today   Back Pain      Prior to Admission medications   Medication Sig Start Date End Date Taking? Authorizing Provider  gabapentin  (NEURONTIN ) 300 MG capsule TAKE 1 CAPSULE(300 MG) BY MOUTH AT BEDTIME 06/09/24   Newlin, Enobong, MD  naltrexone  (DEPADE) 50 MG tablet Take 1 tablet (50 mg total) by mouth at bedtime. 06/26/24 08/25/24  Chien, Stephanie, MD  naltrexone  (DEPADE) 50 MG tablet Take 0.5 tablets (25 mg total) by mouth daily for 8 days. 06/18/24 06/26/24  Chien, Stephanie, MD  famotidine  (PEPCID ) 20 MG tablet Take 1 tablet (20 mg total) by mouth 2 (two) times daily. Patient not taking: Reported on 05/12/2019 04/28/19 05/19/20  Desiderio Chew, PA-C  potassium chloride  SA (K-DUR) 20 MEQ tablet Take 1 tablet (20 mEq total) by mouth 2 (two) times daily for 4 days. Patient not taking: Reported on 05/12/2019 04/22/19 05/19/20  Freddi Hamilton, MD  promethazine  (PHENERGAN ) 25 MG tablet Take 1 tablet (25 mg total) by mouth every 6 (six) hours as needed for nausea or vomiting. Patient not taking: Reported on 05/12/2019 03/22/19 05/19/20  Charlyn Sora, MD    Allergies: Patient has no known allergies.    Review of Systems  Musculoskeletal:  Positive for back pain.    Updated Vital Signs BP (!) 122/93   Pulse 72   Temp 98.5  F (36.9 C) (Oral)   Resp 14   SpO2 100%   Physical Exam Vitals and nursing note reviewed.  Constitutional:      General: She is not in acute distress.    Appearance: She is not toxic-appearing.  HENT:     Head: Atraumatic.     Nose: Nose normal.     Mouth/Throat:     Mouth: Mucous membranes are moist.  Eyes:     Conjunctiva/sclera: Conjunctivae normal.  Cardiovascular:     Rate and Rhythm: Normal rate and regular rhythm.  Abdominal:     General: Abdomen is flat.  Musculoskeletal:     Comments: No midline spinal tenderness.  Does have some tenderness along the right SI joint and iliac crest into the quadratus lumborum.  Able to ambulate with steady gait.  Able to stand on tiptoes and on heels.  Normal sensation in lower extremities.  Equal pulses.  Skin:    General: Skin is warm and dry.     Capillary Refill: Capillary refill takes less than 2 seconds.  Neurological:     Mental Status: She is oriented to person, place, and time.  Psychiatric:        Mood and Affect: Mood normal.        Behavior: Behavior normal.     (all labs ordered are listed, but only abnormal results are displayed) Labs Reviewed - No data to display  EKG: None  Radiology:  No results found.   Procedures   Medications Ordered in the ED - No data to display                                  Medical Decision Making This is 33 year old female presenting emergency department acute on chronic back pain.  Afebrile vital signs reassuring.  Physical exam without localizing deficits.  No red flags on history or physical exam.  Discussed supportive medications with Tylenol  turning ibuprofen  for baseline pain control.  Will discharge with lidocaine  patches and muscle relaxer.  Discussed follow-up with primary doctor for possible physical therapy referral.  Stable for discharge she has no emergent condition identified.  Amount and/or Complexity of Data Reviewed Labs:     Details: Considered, however  seemingly a acute on chronic issue.  Labs unlikely to change management or disposition as I have low suspicion for infectious process.  Metabolic derangements unlikely. Radiology:     Details: Considered imaging, however pain started today, long history of back pain.  No red flags to suggest acute cord compression or epidural abscess.  Risk Decision regarding hospitalization. Diagnosis or treatment significantly limited by social determinants of health. Risk Details: Poor health literacy        Final diagnoses:  None    ED Discharge Orders     None          Neysa Caron PARAS, DO 06/24/24 1516

## 2024-06-24 NOTE — ED Notes (Signed)

## 2024-06-28 ENCOUNTER — Other Ambulatory Visit (HOSPITAL_COMMUNITY)

## 2024-07-13 ENCOUNTER — Ambulatory Visit (HOSPITAL_COMMUNITY)

## 2024-07-19 ENCOUNTER — Ambulatory Visit: Admitting: Family Medicine

## 2024-07-20 ENCOUNTER — Other Ambulatory Visit: Payer: Self-pay | Admitting: Family Medicine

## 2024-07-20 DIAGNOSIS — G621 Alcoholic polyneuropathy: Secondary | ICD-10-CM

## 2024-07-20 NOTE — Telephone Encounter (Unsigned)
 Copied from CRM [id number]. Topic: Clinical - Medication Refill >> Jul 20, 2024 12:35 PM Lonell PEDLAR wrote: Medication:  gabapentin  (NEURONTIN ) 300 MG capsule   Has the patient contacted their pharmacy? Yes advised to call pcp (Agent: If no, request that the patient contact the pharmacy for the refill. If patient does not wish to contact the pharmacy document the reason why and proceed with request.) (Agent: If yes, when and what did the pharmacy advise?)  This is the patient's preferred pharmacy:  [id number] [place], [place] - [address] AT [facility] OF [address] & [facility] [address] [place] [address] Phone: [phone] Fax: [phone]  Is this the correct pharmacy for this prescription? Yes If no, delete pharmacy and type the correct one.   Has the prescription been filled recently? Yes  Is the patient out of the medication? No  Has the patient been seen for an appointment in the last year OR does the patient have an upcoming appointment? Yes  Can we respond through MyChart? No  Agent: Please be advised that Rx refills may take up to 3 business days. We ask that you follow-up with your pharmacy.

## 2024-07-22 NOTE — Telephone Encounter (Signed)
 Requested medication (s) are due for refill today: routing for review  Requested medication (s) are on the active medication list: yes  Last refill:  06/09/24  Future visit scheduled: yes  Notes to clinic:  routing for review     Requested Prescriptions  Pending Prescriptions Disp Refills   gabapentin  (NEURONTIN ) 300 MG capsule 90 capsule 0     Neurology: Anticonvulsants - gabapentin  Passed - 07/22/2024 12:33 PM      Passed - Cr in normal range and within 360 days    Creatinine, Ser  Date Value Ref Range Status  08/20/2023 0.61 0.57 - 1.00 mg/dL Final         Passed - Completed PHQ-2 or PHQ-9 in the last 360 days      Passed - Valid encounter within last 12 months    Recent Outpatient Visits           6 months ago Anxiety   [place] [facility] - [facility] Dept Of [place]. [facility] Theotis Haze ORN, NP   11 months ago Midepigastric pain   [place] [facility] [place] - A Dept Of [place]. [facility] Joshua, Jon HERO, [place]   1 year ago Encounter for gynecological examination   [place] [facility] [place] - A Dept Of [place]. [facility] Delbert Clam, MD   1 year ago Primary hypertension   [place] [facility] [place] - A Dept Of [place]. [facility] Delbert Clam, MD   1 year ago Primary hypertension   [place] [facility] [place] - A Dept Of [place]. [facility] Delbert Clam, MD

## 2024-07-23 MED ORDER — GABAPENTIN 300 MG PO CAPS: 300.0000 mg | ORAL_CAPSULE | Freq: Every day | ORAL | 0 refills | Status: DC
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
## 2024-07-27 ENCOUNTER — Encounter (HOSPITAL_COMMUNITY): Payer: Self-pay

## 2024-07-27 ENCOUNTER — Ambulatory Visit (INDEPENDENT_AMBULATORY_CARE_PROVIDER_SITE_OTHER): Admitting: Psychiatry

## 2024-07-27 DIAGNOSIS — F1020 Alcohol dependence, uncomplicated: Secondary | ICD-10-CM

## 2024-07-27 DIAGNOSIS — F4310 Post-traumatic stress disorder, unspecified: Secondary | ICD-10-CM

## 2024-07-27 DIAGNOSIS — F411 Generalized anxiety disorder: Secondary | ICD-10-CM

## 2024-07-27 MED ORDER — NALTREXONE HCL 50 MG PO TABS: 50.0000 mg | ORAL_TABLET | Freq: Every day | ORAL | 2 refills | Status: AC

## 2024-08-02 ENCOUNTER — Other Ambulatory Visit (INDEPENDENT_AMBULATORY_CARE_PROVIDER_SITE_OTHER)

## 2024-08-02 ENCOUNTER — Ambulatory Visit (INDEPENDENT_AMBULATORY_CARE_PROVIDER_SITE_OTHER)

## 2024-08-02 DIAGNOSIS — F1020 Alcohol dependence, uncomplicated: Secondary | ICD-10-CM

## 2024-08-02 DIAGNOSIS — F411 Generalized anxiety disorder: Secondary | ICD-10-CM

## 2024-08-02 NOTE — Progress Notes (Signed)
 Patient presented to the office for labs, labs were drawn from right [facility] with no issue or complaints . Pt left office alert and ambulatory.

## 2024-08-02 NOTE — Progress Notes (Signed)
 Pt presented to office for EKG. Pt tolerated well. Provider will follow up with results. CJT-CMA Student.

## 2024-08-04 ENCOUNTER — Ambulatory Visit: Payer: Self-pay | Admitting: Psychiatry

## 2024-08-04 LAB — COMPREHENSIVE METABOLIC PANEL WITH GFR
ALT: 21 IU/L (ref 0–32)
AST: 45 IU/L — ABNORMAL HIGH (ref 0–40)
Albumin: 4.4 g/dL (ref 3.9–4.9)
Alkaline Phosphatase: 125 IU/L — ABNORMAL HIGH (ref 41–116)
BUN/Creatinine Ratio: 11 (ref 9–23)
BUN: 7 mg/dL (ref 6–20)
Bilirubin Total: 0.5 mg/dL (ref 0.0–1.2)
Calcium: 8.6 mg/dL — ABNORMAL LOW (ref 8.7–10.2)
Chloride: 108 mmol/L — ABNORMAL HIGH (ref 96–106)
Creatinine, Ser: 0.65 mg/dL (ref 0.57–1.00)
Globulin, Total: 2.2 g/dL (ref 1.5–4.5)
Glucose: 110 mg/dL — ABNORMAL HIGH (ref 70–99)
Potassium: 4.5 mmol/L (ref 3.5–5.2)
Sodium: 143 mmol/L (ref 134–144)
Total Protein: 6.6 g/dL (ref 6.0–8.5)
eGFR: 120 mL/min/1.73

## 2024-08-04 LAB — FOLATE: Folate: 2 ng/mL — ABNORMAL LOW

## 2024-08-04 LAB — VITAMIN B12: Vitamin B-12: 469 pg/mL (ref 232–1245)

## 2024-08-04 LAB — VITAMIN D 25 HYDROXY (VIT D DEFICIENCY, FRACTURES): Vit D, 25-Hydroxy: 12.1 ng/mL — ABNORMAL LOW (ref 30.0–100.0)

## 2024-08-04 LAB — TSH: TSH: 1.94 u[IU]/mL (ref 0.450–4.500)

## 2024-08-12 ENCOUNTER — Emergency Department (HOSPITAL_BASED_OUTPATIENT_CLINIC_OR_DEPARTMENT_OTHER)

## 2024-08-12 ENCOUNTER — Other Ambulatory Visit: Payer: Self-pay

## 2024-08-12 ENCOUNTER — Emergency Department (HOSPITAL_BASED_OUTPATIENT_CLINIC_OR_DEPARTMENT_OTHER)
Admission: EM | Admit: 2024-08-12 | Discharge: 2024-08-12 | Disposition: A | Discharge status: Home / Self Care | Attending: Emergency Medicine | Admitting: Emergency Medicine

## 2024-08-12 ENCOUNTER — Encounter (HOSPITAL_BASED_OUTPATIENT_CLINIC_OR_DEPARTMENT_OTHER): Payer: Self-pay

## 2024-08-12 DIAGNOSIS — W500XXA Accidental hit or strike by another person, initial encounter: Secondary | ICD-10-CM | POA: Insufficient documentation

## 2024-08-12 DIAGNOSIS — S0992XA Unspecified injury of nose, initial encounter: Secondary | ICD-10-CM | POA: Diagnosis present

## 2024-08-12 DIAGNOSIS — S022XXA Fracture of nasal bones, initial encounter for closed fracture: Secondary | ICD-10-CM | POA: Diagnosis not present

## 2024-08-12 NOTE — ED Provider Notes (Signed)
 " [place] [facility] DE[facility]MENT AT [facility] Provider Note   CSN: [id number] Arrival date & time: 08/12/24  9146     Patient presents with: Facial Injury   Marcia Jackson is a 34 y.o. female.   Patient is a 34 year old female with a history of depression who presents today with injury to her nose.  She reports that she and her son were playing around last night and his head smashed into her nose.  Since that time she has had significant pain in her nose and underneath her eyes.  Also has bruising.  Denies any epistaxis.  No difficulty breathing out of her nose.  No vision changes.  The history is provided by the patient.  Facial Injury      Prior to Admission medications  Medication Sig Start Date End Date Taking? Authorizing Provider  gabapentin  (NEURONTIN ) 300 MG capsule Take 1 capsule (300 mg total) by mouth at bedtime. 07/23/24   Newlin, Enobong, MD  lidocaine  (LIDODERM ) 5 % Place 1 patch onto the skin daily. Remove & Discard patch within 12 hours or as directed by MD 06/24/24   Neysa Caron PARAS, DO  methocarbamol  (ROBAXIN ) 500 MG tablet Take 1 tablet (500 mg total) by mouth 2 (two) times daily as needed for muscle spasms. 06/24/24   Neysa Caron PARAS, DO  naltrexone  (DEPADE) 50 MG tablet Take 1 tablet (50 mg total) by mouth at bedtime. 07/27/24 10/25/24  Chien, Stephanie, MD  famotidine  (PEPCID ) 20 MG tablet Take 1 tablet (20 mg total) by mouth 2 (two) times daily. Patient not taking: Reported on 05/12/2019 04/28/19 05/19/20  Desiderio Chew, PA-C  potassium chloride  SA (K-DUR) 20 MEQ tablet Take 1 tablet (20 mEq total) by mouth 2 (two) times daily for 4 days. Patient not taking: Reported on 05/12/2019 04/22/19 05/19/20  Freddi Hamilton, MD  promethazine  (PHENERGAN ) 25 MG tablet Take 1 tablet (25 mg total) by mouth every 6 (six) hours as needed for nausea or vomiting. Patient not taking: Reported on 05/12/2019 03/22/19 05/19/20  Charlyn Sora, MD    Allergies: Patient has no  known allergies.    Review of Systems  Updated Vital Signs BP 118/87 (BP Location: Right Arm)   Pulse 79   Temp 98 F (36.7 C) (Oral)   Resp 18   Ht 5' 4 (1.626 m)   Wt 57.6 kg   SpO2 100%   BMI 21.80 kg/m   Physical Exam Vitals and nursing note reviewed.  HENT:     Head: Normocephalic.     Nose: Nasal tenderness present. No nasal deformity.     Right Sinus: Maxillary sinus tenderness present.     Left Sinus: Maxillary sinus tenderness present.     Comments: Swelling and ecchymosis noted to the bridge of the nose and ecchymosis under the eyes with tenderness of the maxillary sinuses bilaterally Eyes:     Extraocular Movements: Extraocular movements intact.     Pupils: Pupils are equal, round, and reactive to light.  Pulmonary:     Effort: Pulmonary effort is normal.  Musculoskeletal:     Cervical back: Normal range of motion and neck supple. No tenderness.  Neurological:     Mental Status: She is alert. Mental status is at baseline.     (all labs ordered are listed, but only abnormal results are displayed) Labs Reviewed - No data to display  EKG: None  Radiology: CT Maxillofacial Wo Contrast Result Date: 08/12/2024 EXAM: CT Face without contrast 08/12/2024 09:21:05 AM TECHNIQUE:  CT of the face was performed without the administration of intravenous contrast. Multiplanar reformatted images are provided for review. Automated exposure control, iterative reconstruction, and/or weight based adjustment of the mA/kV was utilized to reduce the radiation dose to as low as reasonably achievable. COMPARISON: CT maxillofacial 07/15/2012 CLINICAL HISTORY: Facial trauma, blunt FINDINGS: AERODIGESTIVE TRACT: No mass. No edema. SALIVARY GLANDS: No acute abnormality. LYMPH NODES: No suspicious cervical lymphadenopathy. SOFT TISSUES: Trauma to the bridge of the nose with bruising. Soft tissue swelling is present over the cartilaginous anterior nose. BRAIN, ORBITS AND SINUSES: Minimal  mucosal thickening is present in the left frontal sinus. Mild mucosal thickening is present in the right sphenoid sinus. Rightward nasal septal deviation is stable. BONES: A minimally displaced fracture is present in the anterior nasal septum, best visualized on the lateral reformats image 45 of series 9. No suspicious bone lesion. IMPRESSION: 1. Minimally displaced fracture of the anterior nasal septum. 2. Soft tissue swelling over the cartilaginous anterior nose. Electronically signed by: Lonni Necessary MD 08/12/2024 09:37 AM EST RP Workstation: [id number]     Procedures   Medications Ordered in the ED - No data to display                                  Medical Decision Making Amount and/or Complexity of Data Reviewed Radiology: ordered and independent interpretation performed. Decision-making details documented in ED Course.   Patient presenting today with injury of her nose.  Will ensure no facial fractures.  Otherwise vision is intact and no evidence of extraocular muscle entrapment. I have independently visualized and interpreted pt's images today.  CT face with signs for nasal bone fx.  Radiology reports minimally displaced anterior nasal septum fx.  NO signs of septal hematoma at this time on exam.     Final diagnoses:  Closed fracture of nasal bone, initial encounter    ED Discharge Orders     None          Doretha Folks, MD 08/12/24 [phone]  "

## 2024-08-12 NOTE — ED Triage Notes (Signed)
 Last night playing with son they collided and his head hit her nose. No deformity noted. Pt denies any bleeding. Bruising noted around bridge of nose.

## 2024-08-12 NOTE — Discharge Instructions (Addendum)
 You have broken your nose today.  Be careful with nose blowing.  Take Tylenol  and ibuprofen  as needed for pain use ice to help with the swelling.

## 2024-09-02 ENCOUNTER — Ambulatory Visit (INDEPENDENT_AMBULATORY_CARE_PROVIDER_SITE_OTHER)

## 2024-09-02 DIAGNOSIS — F4310 Post-traumatic stress disorder, unspecified: Secondary | ICD-10-CM

## 2024-09-02 DIAGNOSIS — F1020 Alcohol dependence, uncomplicated: Secondary | ICD-10-CM

## 2024-09-02 DIAGNOSIS — F411 Generalized anxiety disorder: Secondary | ICD-10-CM

## 2024-09-02 NOTE — Progress Notes (Signed)
 THERAPIST PROGRESS NOTE  Session Time: 1:00 pm to 1:53 pm  Type of Therapy: Individual   Therapist Response/Interventions: Psycho-education/CBT: Discussed and challenged belief system of pts understanding of who an alcoholic is/ discussed addiction as a brain disease and how to start changing the brain through neurogenesis/Introduced a deep breathing and grounding technique to reduce anxiety and divert attention from using and Challenged Desare to identify two coping skills to divert her attention from craving and will also help with decreasing anxiety.  :Treatment Goals addressed:   Template: Substance Use Disorder         Problem: Substance Use     Dates: Start:  04/20/24       Disciplines: Interdisciplinary, PROVIDER        Goal: Zephyra will abstain from alcohol 30/30 days per month based on self reports or breathalyzer if indicated.     Dates: Start:  04/20/24    Expected End:  10/18/24       Disciplines: Interdisciplinary, PROVIDER             Goal: Nesiah will decrease her anxiety by reporting GAD-7 scores of no higher than 7.     Dates: Start:  04/20/24    Expected End:  10/13/24       Disciplines: Interdisciplinary, PROVIDER             Intervention: Therapist will assist Tanique in identifying and avoiding triggers for alcohol use and assist her in understanding addiction as a disease.     Dates: Start:  04/20/24                Intervention: Therapist will assist Yasira in identifying thoughts and behaviors that can contribute to feelings of anxiety     Dates: Start:  04/20/24                 Summary: Amnah presents today in person. She brings her daughter with her to the session today stating she did not want to leave her at  home alone. Zeda reports after she missed her appoints she began drinking again and had some difficulty in stopping.  She says she now has approximately one week sober.  She rates her anxiety as a 5 today.   Lucila practices the  techniques in session.   She comments that the techniques make her feel relaxed.  Progress Towards Goals: progressing  Suicidal/Homicidal: denies  Plan: Return again Sep 14, 2024 at 3pm  Diagnosis: Alcohol Use Disorder, Moderate, Dependence, PTSD, GAD  Collaboration of Care: n/a  Patient/Guardian was advised Release of Information must be obtained prior to any record release in order to collaborate their care with an outside provider. Patient/Guardian was advised if they have not already done so to contact the registration department to sign all necessary forms in order for us  to release information regarding their care.   Consent: Patient/Guardian gives verbal consent for treatment and assignment of benefits for services provided during this visit. Patient/Guardian expressed understanding and agreed to proceed.   Darice Simpler, MS.  LMFT, LCAS

## 2024-09-08 NOTE — Progress Notes (Signed)
 ERRONEOUS ENCOUNTER   Patient connected to visit however she was in the school hallway of her son's school due to having to go in. We rescheduled her appointment for next week.    Labs:   CMP 07/2024 with elevated alk phos 125 otherwise stable  Vitamin D  07/2024 12.1, folate deficient   B12 and TSH wnl  EKG: 07/2024 with Qtc 442  CT head 2009 wnl Sleep study: none  Interval History:   --saw Darice Simpler  --went back to drinking after missing appointments, having difficulty with staying sober

## 2024-09-13 ENCOUNTER — Encounter (HOSPITAL_COMMUNITY): Payer: Self-pay

## 2024-09-14 ENCOUNTER — Other Ambulatory Visit: Payer: Self-pay | Admitting: Family Medicine

## 2024-09-14 ENCOUNTER — Encounter: Payer: Self-pay | Admitting: Family Medicine

## 2024-09-14 ENCOUNTER — Ambulatory Visit (INDEPENDENT_AMBULATORY_CARE_PROVIDER_SITE_OTHER)

## 2024-09-14 ENCOUNTER — Ambulatory Visit: Admitting: Family Medicine

## 2024-09-14 VITALS — BP 113/77 | HR 86 | Temp 98.3°F | Ht 64.0 in | Wt 127.8 lb

## 2024-09-14 DIAGNOSIS — F4310 Post-traumatic stress disorder, unspecified: Secondary | ICD-10-CM | POA: Diagnosis not present

## 2024-09-14 DIAGNOSIS — R87610 Atypical squamous cells of undetermined significance on cytologic smear of cervix (ASC-US): Secondary | ICD-10-CM

## 2024-09-14 DIAGNOSIS — Z1331 Encounter for screening for depression: Secondary | ICD-10-CM | POA: Diagnosis not present

## 2024-09-14 DIAGNOSIS — F1020 Alcohol dependence, uncomplicated: Secondary | ICD-10-CM | POA: Diagnosis not present

## 2024-09-14 DIAGNOSIS — F411 Generalized anxiety disorder: Secondary | ICD-10-CM

## 2024-09-14 DIAGNOSIS — G621 Alcoholic polyneuropathy: Secondary | ICD-10-CM | POA: Diagnosis not present

## 2024-09-14 DIAGNOSIS — F419 Anxiety disorder, unspecified: Secondary | ICD-10-CM

## 2024-09-14 MED ORDER — GABAPENTIN 300 MG PO CAPS: 300.0000 mg | ORAL_CAPSULE | Freq: Every day | ORAL | 3 refills | Status: AC

## 2024-09-14 NOTE — Patient Instructions (Signed)
 Anxiety in Adults: How to Manage Anxiety is when you feel worried or scared. Everyone has anxiety at one time or another. Learning ways to manage anxiety and its causes, like stress, can help make your daily life better. How does anxiety affect me? Anxiety can cause health problems. These may include: Headaches. Body pain. High blood pressure. Sleep problems. Mental health symptoms. How to recognize anxiety and stress Anxiety and stress are connected to each other in many ways. For instance, having stress for a long time can lead to anxiety. But there are some differences. Stress: Is caused by something hard happening around you. Goes away after the hard thing is over. Anxiety: Can happen for no clear reason. Can stay after a stressful event is over. How to manage lifestyle changes To help manage your anxiety and stress, you could: Do things that help you feel calm, such as: Meditating. Relaxing your muscles. Deep breathing. Coloring or drawing. Move your body. You could: Take a short walk each day. Do yoga. Do things you like or that are fun for you, such as: Reading. Listening to music. Spend time in nature or watch nature videos. Try to change negative thoughts to positive ones. Spend time with friends and family. Follow these instructions at home: Lifestyle  Get enough good sleep each night. Eat healthy. Drink enough water. General instructions Take your medicines only as told. Ask your health care provider about treatments that may help. You may need: Medicine. Talk therapy. Both medicine and talk therapy. Where to find support For support, you can reach out to: Your provider. A mental health specialist, such as a therapist or counselor. Where to find more information To learn more, go to: The [employer] on Mental Illness (NAMI) at ringtonefundraiser.se. The [employer] of Mental Health [facility]) at bloggercourse.com. Click on the magnifying glass and type help  for mental illness. Find the link you need. Contact a health care provider if: You have stress you can't handle. Your anxiety stops you from doing daily tasks. You avoid things out of fear, worry, or stress. Your symptoms get worse. You have new symptoms. Your symptoms don't get better with treatment. Get help right away if: You feel like you may hurt yourself or others. You have thoughts about taking your own life. You have other thoughts or feelings that worry you. These symptoms may be an emergency. Take one of these steps right away: Go to your nearest emergency room. Call 911. Contact the Suicide & Crisis Lifeline (24/7, free and confidential): Call or text 988. Chat online at chat.newsactor.se. For Veterans and their loved ones: Call 988 and press 1. Text the [employer] at (470) 675-9820. Chat online at reservationslist.si. This information is not intended to replace advice given to you by your health care provider. Make sure you discuss any questions you have with your health care provider. Document Revised: 05/24/2024 Document Reviewed: 05/24/2024 Elsevier Patient Education  2025 [employer].

## 2024-09-15 ENCOUNTER — Encounter: Payer: Self-pay | Admitting: Family Medicine

## 2024-09-16 ENCOUNTER — Encounter (HOSPITAL_COMMUNITY): Admitting: Psychiatry

## 2024-09-23 ENCOUNTER — Telehealth (HOSPITAL_COMMUNITY): Admitting: Psychiatry

## 2024-09-28 ENCOUNTER — Ambulatory Visit (HOSPITAL_COMMUNITY)

## 2024-10-13 ENCOUNTER — Ambulatory Visit: Payer: Self-pay | Admitting: Family Medicine
